# Patient Record
Sex: Female | Born: 1935 | Race: White | Hispanic: No | Smoking: Never smoker
Health system: Southern US, Community
[De-identification: ages and names within clinical notes are randomized; demographics above are authoritative.]

## PROBLEM LIST (undated history)

## (undated) DIAGNOSIS — G43909 Migraine, unspecified, not intractable, without status migrainosus: Secondary | ICD-10-CM

## (undated) DIAGNOSIS — H3530 Unspecified macular degeneration: Secondary | ICD-10-CM

## (undated) DIAGNOSIS — I809 Phlebitis and thrombophlebitis of unspecified site: Secondary | ICD-10-CM

## (undated) DIAGNOSIS — E785 Hyperlipidemia, unspecified: Secondary | ICD-10-CM

## (undated) DIAGNOSIS — E119 Type 2 diabetes mellitus without complications: Secondary | ICD-10-CM

## (undated) DIAGNOSIS — R059 Cough, unspecified: Secondary | ICD-10-CM

## (undated) DIAGNOSIS — M1990 Unspecified osteoarthritis, unspecified site: Secondary | ICD-10-CM

## (undated) DIAGNOSIS — M48061 Spinal stenosis, lumbar region without neurogenic claudication: Secondary | ICD-10-CM

## (undated) DIAGNOSIS — M069 Rheumatoid arthritis, unspecified: Secondary | ICD-10-CM

## (undated) DIAGNOSIS — R911 Solitary pulmonary nodule: Secondary | ICD-10-CM

## (undated) DIAGNOSIS — J189 Pneumonia, unspecified organism: Secondary | ICD-10-CM

## (undated) DIAGNOSIS — R05 Cough: Secondary | ICD-10-CM

## (undated) DIAGNOSIS — F419 Anxiety disorder, unspecified: Secondary | ICD-10-CM

## (undated) DIAGNOSIS — F32A Depression, unspecified: Secondary | ICD-10-CM

## (undated) DIAGNOSIS — Z923 Personal history of irradiation: Secondary | ICD-10-CM

## (undated) DIAGNOSIS — C50919 Malignant neoplasm of unspecified site of unspecified female breast: Secondary | ICD-10-CM

## (undated) DIAGNOSIS — I10 Essential (primary) hypertension: Secondary | ICD-10-CM

## (undated) DIAGNOSIS — I499 Cardiac arrhythmia, unspecified: Secondary | ICD-10-CM

## (undated) DIAGNOSIS — M109 Gout, unspecified: Secondary | ICD-10-CM

## (undated) DIAGNOSIS — F329 Major depressive disorder, single episode, unspecified: Secondary | ICD-10-CM

## (undated) DIAGNOSIS — J849 Interstitial pulmonary disease, unspecified: Secondary | ICD-10-CM

## (undated) HISTORY — DX: Rheumatoid arthritis, unspecified: M06.9

## (undated) HISTORY — DX: Solitary pulmonary nodule: R91.1

## (undated) HISTORY — PX: ABDOMINAL HYSTERECTOMY: SHX81

## (undated) HISTORY — PX: ESOPHAGOGASTRODUODENOSCOPY: SHX1529

## (undated) HISTORY — DX: Hyperlipidemia, unspecified: E78.5

## (undated) HISTORY — PX: COLONOSCOPY: SHX174

## (undated) HISTORY — DX: Unspecified macular degeneration: H35.30

## (undated) HISTORY — DX: Spinal stenosis, lumbar region without neurogenic claudication: M48.061

## (undated) HISTORY — DX: Gout, unspecified: M10.9

## (undated) HISTORY — DX: Phlebitis and thrombophlebitis of unspecified site: I80.9

## (undated) HISTORY — DX: Migraine, unspecified, not intractable, without status migrainosus: G43.909

## (undated) HISTORY — DX: Depression, unspecified: F32.A

## (undated) HISTORY — DX: Anxiety disorder, unspecified: F41.9

## (undated) HISTORY — PX: LYMPH NODE BIOPSY: SHX201

## (undated) HISTORY — DX: Essential (primary) hypertension: I10

## (undated) HISTORY — DX: Cough: R05

## (undated) HISTORY — PX: MASTECTOMY, PARTIAL: SHX709

## (undated) HISTORY — DX: Cough, unspecified: R05.9

## (undated) HISTORY — DX: Interstitial pulmonary disease, unspecified: J84.9

## (undated) HISTORY — DX: Unspecified osteoarthritis, unspecified site: M19.90

## (undated) HISTORY — DX: Major depressive disorder, single episode, unspecified: F32.9

---
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
## 2004-05-15 ENCOUNTER — Ambulatory Visit: Payer: Self-pay | Admitting: Otolaryngology

## 2004-07-11 ENCOUNTER — Ambulatory Visit: Payer: Self-pay | Admitting: Otolaryngology

## 2004-07-11 ENCOUNTER — Other Ambulatory Visit: Payer: Self-pay

## 2004-07-19 ENCOUNTER — Ambulatory Visit: Payer: Self-pay | Admitting: Otolaryngology

## 2004-10-12 ENCOUNTER — Ambulatory Visit: Payer: Self-pay | Admitting: Internal Medicine

## 2005-10-24 ENCOUNTER — Ambulatory Visit: Payer: Self-pay | Admitting: Internal Medicine

## 2006-10-28 ENCOUNTER — Ambulatory Visit: Payer: Self-pay | Admitting: Internal Medicine

## 2007-01-01 ENCOUNTER — Ambulatory Visit: Payer: Self-pay | Admitting: Gastroenterology

## 2008-01-13 ENCOUNTER — Ambulatory Visit: Payer: Self-pay | Admitting: Internal Medicine

## 2008-11-16 ENCOUNTER — Ambulatory Visit: Payer: Self-pay | Admitting: Rheumatology

## 2009-01-20 ENCOUNTER — Ambulatory Visit: Payer: Self-pay | Admitting: Internal Medicine

## 2010-01-24 ENCOUNTER — Ambulatory Visit: Payer: Self-pay | Admitting: Internal Medicine

## 2010-04-01 ENCOUNTER — Inpatient Hospital Stay: Payer: Self-pay | Admitting: Family Medicine

## 2011-02-26 ENCOUNTER — Ambulatory Visit: Payer: Self-pay | Admitting: Family Medicine

## 2011-07-25 ENCOUNTER — Emergency Department: Payer: Self-pay | Admitting: Emergency Medicine

## 2011-07-25 LAB — URINALYSIS, COMPLETE
Bilirubin,UR: NEGATIVE
Blood: NEGATIVE
Glucose,UR: NEGATIVE mg/dL (ref 0–75)
Ketone: NEGATIVE
Nitrite: NEGATIVE
Protein: NEGATIVE
Transitional Epi: 1
WBC UR: 3 /HPF (ref 0–5)

## 2011-09-20 ENCOUNTER — Emergency Department: Payer: Self-pay | Admitting: *Deleted

## 2011-09-20 LAB — COMPREHENSIVE METABOLIC PANEL
Anion Gap: 14 (ref 7–16)
BUN: 13 mg/dL (ref 7–18)
Calcium, Total: 9 mg/dL (ref 8.5–10.1)
Chloride: 101 mmol/L (ref 98–107)
Co2: 27 mmol/L (ref 21–32)
EGFR (African American): >60
EGFR (Non-African Amer.): >60
Glucose: 176 mg/dL — ABNORMAL HIGH (ref 65–99)
Osmolality: 288 (ref 275–301)
Potassium: 4 mmol/L (ref 3.5–5.1)
SGOT(AST): 33 U/L (ref 15–37)
SGPT (ALT): 26 U/L
Sodium: 142 mmol/L (ref 136–145)
Total Protein: 7.8 g/dL (ref 6.4–8.2)

## 2011-09-20 LAB — CBC
HCT: 37 % (ref 35.0–47.0)
HGB: 12.2 g/dL (ref 12.0–16.0)
MCH: 30.4 pg (ref 26.0–34.0)
RBC: 4.03 10*6/uL (ref 3.80–5.20)
RDW: 14.7 % — ABNORMAL HIGH (ref 11.5–14.5)
WBC: 5.8 10*3/uL (ref 3.6–11.0)

## 2011-09-20 LAB — TROPONIN I: Troponin-I: <0.02 ng/mL

## 2011-09-20 LAB — CK TOTAL AND CKMB (NOT AT ARMC): CK-MB: 1.5 ng/mL (ref 0.5–3.6)

## 2012-04-29 ENCOUNTER — Ambulatory Visit: Payer: Self-pay | Admitting: Family Medicine

## 2012-07-14 ENCOUNTER — Ambulatory Visit: Payer: Self-pay | Admitting: Gastroenterology

## 2012-09-09 LAB — COMPREHENSIVE METABOLIC PANEL
Albumin: 3.4 g/dL (ref 3.4–5.0)
Alkaline Phosphatase: 62 U/L (ref 50–136)
Anion Gap: 10 (ref 7–16)
BUN: 19 mg/dL — ABNORMAL HIGH (ref 7–18)
Bilirubin,Total: 0.4 mg/dL (ref 0.2–1.0)
Calcium, Total: 9 mg/dL (ref 8.5–10.1)
Chloride: 103 mmol/L (ref 98–107)
Co2: 22 mmol/L (ref 21–32)
Creatinine: 0.74 mg/dL (ref 0.60–1.30)
EGFR (African American): >60
Glucose: 164 mg/dL — ABNORMAL HIGH (ref 65–99)
SGOT(AST): 35 U/L (ref 15–37)
SGPT (ALT): 28 U/L (ref 12–78)
Total Protein: 7.4 g/dL (ref 6.4–8.2)

## 2012-09-09 LAB — URINALYSIS, COMPLETE
Bacteria: NONE SEEN
Bilirubin,UR: NEGATIVE
Blood: NEGATIVE
Glucose,UR: NEGATIVE mg/dL (ref 0–75)
Ketone: NEGATIVE
Leukocyte Esterase: NEGATIVE
Nitrite: NEGATIVE
Ph: 5 (ref 4.5–8.0)
Specific Gravity: 1.019 (ref 1.003–1.030)
Squamous Epithelial: 3
WBC UR: 2 /HPF (ref 0–5)

## 2012-09-09 LAB — CBC
HGB: 12.9 g/dL (ref 12.0–16.0)
MCHC: 33.3 g/dL (ref 32.0–36.0)
MCV: 92 fL (ref 80–100)
Platelet: 156 10*3/uL (ref 150–440)
RDW: 15.1 % — ABNORMAL HIGH (ref 11.5–14.5)

## 2012-09-10 ENCOUNTER — Observation Stay: Payer: Self-pay | Admitting: Family Medicine

## 2012-09-10 LAB — BASIC METABOLIC PANEL
Anion Gap: 4 — ABNORMAL LOW (ref 7–16)
Chloride: 104 mmol/L (ref 98–107)
EGFR (African American): >60
EGFR (Non-African Amer.): >60
Glucose: 120 mg/dL — ABNORMAL HIGH (ref 65–99)
Potassium: 3.5 mmol/L (ref 3.5–5.1)
Sodium: 138 mmol/L (ref 136–145)

## 2012-09-10 LAB — HEMOGLOBIN A1C: Hemoglobin A1C: 7.4 % — ABNORMAL HIGH (ref 4.2–6.3)

## 2012-09-10 LAB — CBC WITH DIFFERENTIAL/PLATELET
Eosinophil %: 3.2 %
HCT: 36.2 % (ref 35.0–47.0)
MCV: 92 fL (ref 80–100)
Monocyte #: 0.7 x10 3/mm (ref 0.2–0.9)
Monocyte %: 11.4 %
RBC: 3.95 10*6/uL (ref 3.80–5.20)
RDW: 15.2 % — ABNORMAL HIGH (ref 11.5–14.5)
WBC: 6.1 10*3/uL (ref 3.6–11.0)

## 2012-09-10 LAB — LIPID PANEL
HDL Cholesterol: 42 mg/dL (ref 40–60)
Ldl Cholesterol, Calc: 60 mg/dL (ref 0–100)
VLDL Cholesterol, Calc: 28 mg/dL (ref 5–40)

## 2012-09-13 LAB — COMPREHENSIVE METABOLIC PANEL
Albumin: 3.5 g/dL (ref 3.4–5.0)
Alkaline Phosphatase: 70 U/L (ref 50–136)
Anion Gap: 8 (ref 7–16)
Bilirubin,Total: 0.5 mg/dL (ref 0.2–1.0)
Calcium, Total: 8.7 mg/dL (ref 8.5–10.1)
EGFR (Non-African Amer.): >60
Glucose: 118 mg/dL — ABNORMAL HIGH (ref 65–99)
Osmolality: 272 (ref 275–301)
SGOT(AST): 49 U/L — ABNORMAL HIGH (ref 15–37)
SGPT (ALT): 41 U/L (ref 12–78)
Sodium: 135 mmol/L — ABNORMAL LOW (ref 136–145)

## 2012-09-13 LAB — CBC
HCT: 38 % (ref 35.0–47.0)
HGB: 11.9 g/dL — ABNORMAL LOW (ref 12.0–16.0)
MCH: 28.6 pg (ref 26.0–34.0)
MCV: 91 fL (ref 80–100)
Platelet: 160 10*3/uL (ref 150–440)

## 2012-09-13 LAB — TROPONIN I: Troponin-I: <0.02 ng/mL

## 2012-09-13 LAB — CK TOTAL AND CKMB (NOT AT ARMC): CK-MB: 1.3 ng/mL (ref 0.5–3.6)

## 2012-09-14 ENCOUNTER — Observation Stay: Payer: Self-pay | Admitting: Family Medicine

## 2012-09-14 LAB — BASIC METABOLIC PANEL
BUN: 12 mg/dL (ref 7–18)
Calcium, Total: 8.7 mg/dL (ref 8.5–10.1)
Chloride: 105 mmol/L (ref 98–107)
Co2: 27 mmol/L (ref 21–32)
Creatinine: 0.74 mg/dL (ref 0.60–1.30)
EGFR (African American): >60
Glucose: 107 mg/dL — ABNORMAL HIGH (ref 65–99)
Osmolality: 276 (ref 275–301)
Potassium: 3.5 mmol/L (ref 3.5–5.1)
Sodium: 138 mmol/L (ref 136–145)

## 2012-09-14 LAB — CBC WITH DIFFERENTIAL/PLATELET
Basophil #: 0 10*3/uL (ref 0.0–0.1)
Basophil %: 1.3 %
Eosinophil %: 4.5 %
HCT: 35.7 % (ref 35.0–47.0)
HGB: 11.7 g/dL — ABNORMAL LOW (ref 12.0–16.0)
Lymphocyte #: 1.7 10*3/uL (ref 1.0–3.6)
Lymphocyte %: 43.5 %
MCH: 30.1 pg (ref 26.0–34.0)
MCV: 92 fL (ref 80–100)
Monocyte #: 0.4 x10 3/mm (ref 0.2–0.9)
Monocyte %: 11.6 %
Neutrophil %: 39.1 %
Platelet: 150 10*3/uL (ref 150–440)
RBC: 3.89 10*6/uL (ref 3.80–5.20)
RDW: 14.8 % — ABNORMAL HIGH (ref 11.5–14.5)
WBC: 3.8 10*3/uL (ref 3.6–11.0)

## 2012-09-14 LAB — LIPID PANEL
Cholesterol: 114 mg/dL (ref 0–200)
HDL Cholesterol: 34 mg/dL — ABNORMAL LOW (ref 40–60)
Ldl Cholesterol, Calc: 53 mg/dL (ref 0–100)
Triglycerides: 134 mg/dL (ref 0–200)
VLDL Cholesterol, Calc: 27 mg/dL (ref 5–40)

## 2012-09-14 LAB — TSH: Thyroid Stimulating Horm: 0.298 u[IU]/mL — ABNORMAL LOW

## 2012-09-16 LAB — BASIC METABOLIC PANEL
BUN: 16 mg/dL (ref 7–18)
Chloride: 103 mmol/L (ref 98–107)
Co2: 32 mmol/L (ref 21–32)
Creatinine: 0.72 mg/dL (ref 0.60–1.30)
EGFR (African American): >60
Osmolality: 279 (ref 275–301)
Potassium: 3.8 mmol/L (ref 3.5–5.1)
Sodium: 139 mmol/L (ref 136–145)

## 2013-05-08 LAB — CBC
HCT: 39.7 % (ref 35.0–47.0)
HGB: 13.7 g/dL (ref 12.0–16.0)
MCH: 31.3 pg (ref 26.0–34.0)
MCV: 91 fL (ref 80–100)
RBC: 4.39 10*6/uL (ref 3.80–5.20)
RDW: 15.7 % — ABNORMAL HIGH (ref 11.5–14.5)
WBC: 11.5 10*3/uL — ABNORMAL HIGH (ref 3.6–11.0)

## 2013-05-09 ENCOUNTER — Inpatient Hospital Stay: Payer: Self-pay

## 2013-05-09 LAB — COMPREHENSIVE METABOLIC PANEL
Albumin: 3.7 g/dL (ref 3.4–5.0)
Calcium, Total: 9.3 mg/dL (ref 8.5–10.1)
Chloride: 100 mmol/L (ref 98–107)
Co2: 26 mmol/L (ref 21–32)
Creatinine: 0.8 mg/dL (ref 0.60–1.30)
Glucose: 195 mg/dL — ABNORMAL HIGH (ref 65–99)
Osmolality: 276 (ref 275–301)
Potassium: 3.8 mmol/L (ref 3.5–5.1)
SGOT(AST): 37 U/L (ref 15–37)
Sodium: 134 mmol/L — ABNORMAL LOW (ref 136–145)

## 2013-05-09 LAB — URINALYSIS, COMPLETE
Bilirubin,UR: NEGATIVE
Ph: 6 (ref 4.5–8.0)
Protein: NEGATIVE
RBC,UR: 6 /HPF (ref 0–5)
Specific Gravity: 1.018 (ref 1.003–1.030)
Squamous Epithelial: 1
WBC UR: 1 /HPF (ref 0–5)

## 2013-05-09 LAB — TROPONIN I: Troponin-I: <0.02 ng/mL

## 2013-05-10 LAB — BASIC METABOLIC PANEL
Anion Gap: 4 — ABNORMAL LOW (ref 7–16)
Calcium, Total: 7.9 mg/dL — ABNORMAL LOW (ref 8.5–10.1)
Chloride: 106 mmol/L (ref 98–107)
EGFR (African American): >60
Glucose: 196 mg/dL — ABNORMAL HIGH (ref 65–99)
Osmolality: 283 (ref 275–301)
Potassium: 3.6 mmol/L (ref 3.5–5.1)
Sodium: 138 mmol/L (ref 136–145)

## 2013-05-10 LAB — CBC WITH DIFFERENTIAL/PLATELET
Basophil #: 0 10*3/uL (ref 0.0–0.1)
Basophil %: 0.1 %
Eosinophil #: 0 10*3/uL (ref 0.0–0.7)
Eosinophil %: 0 %
HCT: 33.5 % — ABNORMAL LOW (ref 35.0–47.0)
HGB: 11.4 g/dL — ABNORMAL LOW (ref 12.0–16.0)
MCV: 92 fL (ref 80–100)
Monocyte %: 1.7 %
Neutrophil #: 10.6 10*3/uL — ABNORMAL HIGH (ref 1.4–6.5)
Platelet: 120 10*3/uL — ABNORMAL LOW (ref 150–440)
RBC: 3.65 10*6/uL — ABNORMAL LOW (ref 3.80–5.20)
WBC: 11.3 10*3/uL — ABNORMAL HIGH (ref 3.6–11.0)

## 2013-05-14 LAB — CULTURE, BLOOD (SINGLE)

## 2013-07-02 DIAGNOSIS — C50919 Malignant neoplasm of unspecified site of unspecified female breast: Secondary | ICD-10-CM

## 2013-07-02 DIAGNOSIS — Z923 Personal history of irradiation: Secondary | ICD-10-CM

## 2013-07-02 HISTORY — DX: Personal history of irradiation: Z92.3

## 2013-07-02 HISTORY — PX: BREAST BIOPSY: SHX20

## 2013-07-02 HISTORY — DX: Malignant neoplasm of unspecified site of unspecified female breast: C50.919

## 2013-07-10 ENCOUNTER — Ambulatory Visit: Payer: Self-pay | Admitting: Rheumatology

## 2013-10-15 DIAGNOSIS — F329 Major depressive disorder, single episode, unspecified: Secondary | ICD-10-CM | POA: Insufficient documentation

## 2013-10-15 DIAGNOSIS — M109 Gout, unspecified: Secondary | ICD-10-CM | POA: Insufficient documentation

## 2013-10-15 DIAGNOSIS — I10 Essential (primary) hypertension: Secondary | ICD-10-CM | POA: Insufficient documentation

## 2013-10-15 DIAGNOSIS — M1990 Unspecified osteoarthritis, unspecified site: Secondary | ICD-10-CM | POA: Insufficient documentation

## 2013-10-15 DIAGNOSIS — F32A Depression, unspecified: Secondary | ICD-10-CM | POA: Insufficient documentation

## 2013-10-15 DIAGNOSIS — M069 Rheumatoid arthritis, unspecified: Secondary | ICD-10-CM | POA: Insufficient documentation

## 2013-10-15 DIAGNOSIS — H3530 Unspecified macular degeneration: Secondary | ICD-10-CM | POA: Insufficient documentation

## 2013-10-15 DIAGNOSIS — E782 Mixed hyperlipidemia: Secondary | ICD-10-CM | POA: Insufficient documentation

## 2013-10-15 DIAGNOSIS — F419 Anxiety disorder, unspecified: Secondary | ICD-10-CM

## 2014-02-03 ENCOUNTER — Ambulatory Visit: Payer: Self-pay | Admitting: Family Medicine

## 2014-02-15 ENCOUNTER — Ambulatory Visit: Payer: Self-pay | Admitting: Family Medicine

## 2014-02-18 ENCOUNTER — Ambulatory Visit: Payer: Self-pay | Admitting: Internal Medicine

## 2014-02-22 LAB — PATHOLOGY REPORT

## 2014-02-27 ENCOUNTER — Observation Stay: Payer: Self-pay

## 2014-02-27 LAB — URINALYSIS, COMPLETE
BILIRUBIN, UR: NEGATIVE
Blood: NEGATIVE
GLUCOSE, UR: NEGATIVE mg/dL (ref 0–75)
KETONE: NEGATIVE
Nitrite: NEGATIVE
Ph: 6 (ref 4.5–8.0)
Protein: NEGATIVE
RBC,UR: 2 /HPF (ref 0–5)
SPECIFIC GRAVITY: 1.015 (ref 1.003–1.030)
Squamous Epithelial: 3

## 2014-02-27 LAB — PROTIME-INR
INR: 1
Prothrombin Time: 13.5 secs (ref 11.5–14.7)

## 2014-02-27 LAB — CBC
HCT: 38.7 % (ref 35.0–47.0)
HGB: 12.8 g/dL (ref 12.0–16.0)
MCH: 31.1 pg (ref 26.0–34.0)
MCHC: 32.9 g/dL (ref 32.0–36.0)
MCV: 94 fL (ref 80–100)
PLATELETS: 175 10*3/uL (ref 150–440)
RBC: 4.1 10*6/uL (ref 3.80–5.20)
RDW: 14.9 % — ABNORMAL HIGH (ref 11.5–14.5)
WBC: 7.2 10*3/uL (ref 3.6–11.0)

## 2014-02-27 LAB — COMPREHENSIVE METABOLIC PANEL
ALT: 33 U/L
ANION GAP: 13 (ref 7–16)
Albumin: 3.6 g/dL (ref 3.4–5.0)
Alkaline Phosphatase: 56 U/L
BUN: 14 mg/dL (ref 7–18)
Bilirubin,Total: 0.4 mg/dL (ref 0.2–1.0)
CHLORIDE: 101 mmol/L (ref 98–107)
Calcium, Total: 9.1 mg/dL (ref 8.5–10.1)
Co2: 23 mmol/L (ref 21–32)
Creatinine: 0.87 mg/dL (ref 0.60–1.30)
EGFR (African American): >60
EGFR (Non-African Amer.): >60
Glucose: 135 mg/dL — ABNORMAL HIGH (ref 65–99)
Osmolality: 276 (ref 275–301)
Potassium: 3.7 mmol/L (ref 3.5–5.1)
SGOT(AST): 33 U/L (ref 15–37)
Sodium: 137 mmol/L (ref 136–145)
Total Protein: 7.2 g/dL (ref 6.4–8.2)

## 2014-02-27 LAB — APTT: ACTIVATED PTT: 28.6 s (ref 23.6–35.9)

## 2014-02-27 LAB — TROPONIN I

## 2014-02-28 LAB — LIPID PANEL
Cholesterol: 146 mg/dL (ref 0–200)
HDL: 43 mg/dL (ref 40–60)
LDL CHOLESTEROL, CALC: 47 mg/dL (ref 0–100)
Triglycerides: 280 mg/dL — ABNORMAL HIGH (ref 0–200)
VLDL CHOLESTEROL, CALC: 56 mg/dL — AB (ref 5–40)

## 2014-03-02 ENCOUNTER — Ambulatory Visit: Payer: Self-pay | Admitting: Internal Medicine

## 2014-03-18 ENCOUNTER — Ambulatory Visit: Payer: Self-pay | Admitting: Surgery

## 2014-03-18 LAB — CBC WITH DIFFERENTIAL/PLATELET
Basophil #: 0.1 10*3/uL (ref 0.0–0.1)
Basophil %: 1.1 %
Eosinophil #: 0.2 10*3/uL (ref 0.0–0.7)
Eosinophil %: 3.6 %
HCT: 38.3 % (ref 35.0–47.0)
HGB: 12.9 g/dL (ref 12.0–16.0)
Lymphocyte #: 1.5 10*3/uL (ref 1.0–3.6)
Lymphocyte %: 27.5 %
MCH: 31.7 pg (ref 26.0–34.0)
MCHC: 33.6 g/dL (ref 32.0–36.0)
MCV: 94 fL (ref 80–100)
MONO ABS: 0.7 x10 3/mm (ref 0.2–0.9)
Monocyte %: 13.4 %
NEUTROS ABS: 2.9 10*3/uL (ref 1.4–6.5)
Neutrophil %: 54.4 %
Platelet: 174 10*3/uL (ref 150–440)
RBC: 4.06 10*6/uL (ref 3.80–5.20)
RDW: 14.8 % — ABNORMAL HIGH (ref 11.5–14.5)
WBC: 5.4 10*3/uL (ref 3.6–11.0)

## 2014-03-26 ENCOUNTER — Ambulatory Visit: Payer: Self-pay | Admitting: Surgery

## 2014-03-31 LAB — PATHOLOGY REPORT

## 2014-04-12 ENCOUNTER — Ambulatory Visit: Payer: Self-pay | Admitting: Internal Medicine

## 2014-04-28 LAB — CBC CANCER CENTER
BASOS ABS: 0.1 x10 3/mm (ref 0.0–0.1)
Basophil %: 1.3 %
Eosinophil #: 0.2 x10 3/mm (ref 0.0–0.7)
Eosinophil %: 5.8 %
HCT: 39 % (ref 35.0–47.0)
HGB: 12.6 g/dL (ref 12.0–16.0)
LYMPHS PCT: 24.8 %
Lymphocyte #: 1 x10 3/mm (ref 1.0–3.6)
MCH: 30.3 pg (ref 26.0–34.0)
MCHC: 32.3 g/dL (ref 32.0–36.0)
MCV: 94 fL (ref 80–100)
MONO ABS: 0.7 x10 3/mm (ref 0.2–0.9)
MONOS PCT: 16.8 %
NEUTROS ABS: 2.1 x10 3/mm (ref 1.4–6.5)
Neutrophil %: 51.3 %
Platelet: 155 x10 3/mm (ref 150–440)
RBC: 4.15 10*6/uL (ref 3.80–5.20)
RDW: 14.7 % — ABNORMAL HIGH (ref 11.5–14.5)
WBC: 4.1 x10 3/mm (ref 3.6–11.0)

## 2014-05-02 ENCOUNTER — Ambulatory Visit: Payer: Self-pay | Admitting: Internal Medicine

## 2014-05-05 LAB — CBC CANCER CENTER
Basophil #: 0.1 x10 3/mm (ref 0.0–0.1)
Basophil %: 1.2 %
EOS ABS: 0.3 x10 3/mm (ref 0.0–0.7)
EOS PCT: 5.7 %
HCT: 39.4 % (ref 35.0–47.0)
HGB: 12.7 g/dL (ref 12.0–16.0)
LYMPHS PCT: 32.3 %
Lymphocyte #: 1.6 x10 3/mm (ref 1.0–3.6)
MCH: 30.2 pg (ref 26.0–34.0)
MCHC: 32.2 g/dL (ref 32.0–36.0)
MCV: 94 fL (ref 80–100)
Monocyte #: 0.5 x10 3/mm (ref 0.2–0.9)
Monocyte %: 11 %
NEUTROS ABS: 2.5 x10 3/mm (ref 1.4–6.5)
NEUTROS PCT: 49.8 %
PLATELETS: 159 x10 3/mm (ref 150–440)
RBC: 4.2 10*6/uL (ref 3.80–5.20)
RDW: 14.2 % (ref 11.5–14.5)
WBC: 5 x10 3/mm (ref 3.6–11.0)

## 2014-05-12 LAB — CBC CANCER CENTER
Basophil #: 0.1 x10 3/mm (ref 0.0–0.1)
Basophil %: 0.6 %
Eosinophil #: 0.2 x10 3/mm (ref 0.0–0.7)
Eosinophil %: 2.5 %
HCT: 39.1 % (ref 35.0–47.0)
HGB: 12.9 g/dL (ref 12.0–16.0)
LYMPHS ABS: 1.3 x10 3/mm (ref 1.0–3.6)
LYMPHS PCT: 12.9 %
MCH: 30.7 pg (ref 26.0–34.0)
MCHC: 33 g/dL (ref 32.0–36.0)
MCV: 93 fL (ref 80–100)
MONO ABS: 1.2 x10 3/mm — AB (ref 0.2–0.9)
MONOS PCT: 12.1 %
NEUTROS ABS: 7 x10 3/mm — AB (ref 1.4–6.5)
NEUTROS PCT: 71.9 %
PLATELETS: 160 x10 3/mm (ref 150–440)
RBC: 4.2 10*6/uL (ref 3.80–5.20)
RDW: 14.6 % — ABNORMAL HIGH (ref 11.5–14.5)
WBC: 9.8 x10 3/mm (ref 3.6–11.0)

## 2014-05-19 LAB — CBC CANCER CENTER
BASOS ABS: 0.1 x10 3/mm (ref 0.0–0.1)
BASOS PCT: 1 %
Eosinophil #: 0.2 x10 3/mm (ref 0.0–0.7)
Eosinophil %: 4.2 %
HCT: 37.2 % (ref 35.0–47.0)
HGB: 12.1 g/dL (ref 12.0–16.0)
Lymphocyte #: 1 x10 3/mm (ref 1.0–3.6)
Lymphocyte %: 19.5 %
MCH: 30.2 pg (ref 26.0–34.0)
MCHC: 32.5 g/dL (ref 32.0–36.0)
MCV: 93 fL (ref 80–100)
MONO ABS: 0.7 x10 3/mm (ref 0.2–0.9)
MONOS PCT: 13.5 %
Neutrophil #: 3.2 x10 3/mm (ref 1.4–6.5)
Neutrophil %: 61.8 %
PLATELETS: 223 x10 3/mm (ref 150–440)
RBC: 3.99 10*6/uL (ref 3.80–5.20)
RDW: 14.7 % — ABNORMAL HIGH (ref 11.5–14.5)
WBC: 5.1 x10 3/mm (ref 3.6–11.0)

## 2014-06-01 ENCOUNTER — Ambulatory Visit: Payer: Self-pay | Admitting: Internal Medicine

## 2014-06-02 LAB — CBC CANCER CENTER
BASOS PCT: 1.3 %
Basophil #: 0.1 x10 3/mm (ref 0.0–0.1)
Eosinophil #: 0.2 x10 3/mm (ref 0.0–0.7)
Eosinophil %: 5.1 %
HCT: 37.2 % (ref 35.0–47.0)
HGB: 11.9 g/dL — ABNORMAL LOW (ref 12.0–16.0)
Lymphocyte #: 0.9 x10 3/mm — ABNORMAL LOW (ref 1.0–3.6)
Lymphocyte %: 22.4 %
MCH: 29.9 pg (ref 26.0–34.0)
MCHC: 32.1 g/dL (ref 32.0–36.0)
MCV: 93 fL (ref 80–100)
MONO ABS: 0.8 x10 3/mm (ref 0.2–0.9)
Monocyte %: 18.6 %
NEUTROS ABS: 2.2 x10 3/mm (ref 1.4–6.5)
NEUTROS PCT: 52.6 %
Platelet: 176 x10 3/mm (ref 150–440)
RBC: 3.99 10*6/uL (ref 3.80–5.20)
RDW: 15.4 % — ABNORMAL HIGH (ref 11.5–14.5)
WBC: 4.2 x10 3/mm (ref 3.6–11.0)

## 2014-07-02 ENCOUNTER — Ambulatory Visit: Payer: Self-pay | Admitting: Internal Medicine

## 2014-07-12 ENCOUNTER — Ambulatory Visit: Admit: 2014-07-12 | Disposition: A | Discharge status: Home / Self Care | Payer: Self-pay | Admitting: Specialist

## 2014-08-04 ENCOUNTER — Ambulatory Visit: Payer: Self-pay | Admitting: Internal Medicine

## 2014-10-22 NOTE — H&P (Signed)
PATIENT NAME:  [name], [name] MR#:  [id number] DATE OF BIRTH:  1932-01-27  DATE OF ADMISSION:  09/09/2012  REFERRING PHYSICIAN:  Priscella Mann, MD  PRIMARY CARE PHYSICIAN:  Dion Body, MD, at [facility]:  Aphasia.   HISTORY OF PRESENT ILLNESS:  The patient is a pleasant 81 year old Caucasian female with a history of rheumatoid arthritis, hypertension, diabetes and questionable history of irregular heart rhythm that could be paroxysmal Afib, but unclear, who presents with acute onset of slurred speech and aphasia earlier today. The patient stated that she was sitting on the sofa talking and holding her grandchild. She suddenly felt like she could not speak and per her daughter who was in the room, the granddaughter noted her to have slurred speech and uncontrolled tongue movement. The patient felt no numbness or weakness at that time and EMS was called at that time. Soon the symptoms resolved possibly lasting about 15 to 20 minutes and currently she is back to her baseline. CAT scan is negative for a stroke. Hospitalist services were contacted for further evaluation and management.   PAST MEDICAL HISTORY:  Questionable history of Afib versus other irregular heart rhythm, diabetes, hypertension, rheumatoid arthritis, gout, arthritis, phlebitis, history of hemorrhoids, history of chronic constipation, macular degeneration.   PAST SURGICAL HISTORY:  Hysterectomy and breast biopsy.   ALLERGIES:  PENICILLIN.   FAMILY HISTORY:  Mom with colon cancer and dad also with cancer and emphysema. Hypertension and CAD also runs in the family.   SOCIAL HISTORY:  She lives with her daughter. No smoking tobacco, alcohol or drug use.   OUTPATIENT MEDICATIONS:  Acetaminophen 500 mg 2 tabs every 6 hours as needed for pain, allopurinol 300 mg daily, baby aspirin daily, Centrum Silver 1 tab daily, Cipro 500 mg 3 times a day started on September 06, 1306 for a UTI, folic acid 1 mg 2 tabs once  a day, lisinopril 20 mg daily, lovastatin 40 mg at bedtime, metformin 500 mg daily, methotrexate 10 mg on Wednesdays, metoprolol 100 mg 3 times a day, paroxetine 20 mg at bedtime, prednisone 5 mg with meals once a day.   REVIEW OF SYSTEMS:  CONSTITUTIONAL: No fever or fatigue. No weight changes.  EYES: Has chronic decreased vision.  ENT: Some sore throat. Some rhinorrhea.  RESPIRATORY: Cough for the past week that is better. No wheezing or shortness of breath. No COPD.  CARDIOVASCULAR: Possible irregular heart rate versus paroxysmal Afib possibly. No chest pains. Positive for off and on palpitations, less so now, and had more recurrent episodes prior. Has a history of high blood pressure.  GASTROINTESTINAL: No nausea, vomiting, diarrhea, abdominal pain or black tarry stools. No bloody stools.  GENITOURINARY: Recent UTI symptoms. Currently on Cipro and resolved.  HEMATOLOGIC/LYMPHATICS: No anemia or easy bruising.  SKIN: No rashes.  MUSCULOSKELETAL: Chronic arthritis and rheumatoid arthritis.  NEUROLOGIC: Denies focal weakness or numbness. No history of TIA or strokes.  PSYCHIATRIC: Has some depression.   PHYSICAL EXAMINATION:  VITAL SIGNS: Temperature 98.2, pulse rate initially 66, respiratory rate 20, blood pressure 165/67, oxygen saturation 95% on room air.  GENERAL: The patient is an elderly Caucasian female lying in bed in no obvious distress.  HEENT: Normocephalic, atraumatic. Pupils are equal and reactive. Anicteric sclerae. Extraocular muscles intact. Moist mucous membranes.  NECK: Supple. No thyroid tenderness or cervical lymphadenopathy.  CARDIOVASCULAR: S1, S2 regular rate and rhythm. No murmurs, rubs, or gallops.  LUNGS: Clear to auscultation without wheezing or rhonchi.  ABDOMEN:  Soft, nontender, nondistended. No organomegaly appreciated.  EXTREMITIES: No significant lower extremity edema.  NEUROLOGICAL: Cranial nerves appear to be grossly intact, II to X. Strength is 5 out of 5  in all extremities. Sensation is intact to light touch.  PSYCHIATRIC: Pleasant, cooperative. Awake, alert, oriented x 3.   DIAGNOSTIC DATA:  CAT scan of the head is negative for acute stroke. Glucose is 164, BUN 19, creatinine 0.74, sodium 135, potassium 3.7 and chloride 103. LFTs are within normal limits. WBC is 6.9, hemoglobin 12.9, platelets 156. Urinalysis is not suggestive of an infection. EKG: Normal sinus rhythm, rate is 62 and complete right bundle. Compared to previous EKG, no acute changes. There are no acute ST elevations or depressions. Chest x-ray, 1 view, showing no acute cardiopulmonary disease.   ASSESSMENT AND PLAN:  A 81 year old Caucasian female with history of diabetes, hypertension, rheumatoid arthritis and questionable history of irregular heart rhythm possibly atrial fibrillation currently in sinus who presents with acute onset of aphasia and slurred speech lasting about 15 to 20 minutes now resolved and possibly a transient ischemic attack. There is a CT of the head which is negative for stroke and the patient is completely back to her baseline. We will obtain an MRI to evaluate for the possibility of being a stroke, get an echocardiogram as well as an ultrasound of the carotids to look for blockages. We will place her on remote telemetry to look for any atrial fibrillation. If there is atrial fibrillation, she likely needs to be on anticoagulation given the transient ischemic attack, diabetes, hypertension and age. At this point, I will hold the aspirin and start her on Aggrenox as well as resume the statin, check frequent neuro checks and check lipid profile. I will hold the metformin, check a hemoglobin A1c and start sliding scale insulin. I will continue the beta-blocker and blood pressure management. She had a recent urinary tract infection. I will continue the Cipro, but at a lower dose for 4 more days. We will start heparin for deep vein thrombosis prophylaxis and resume the  methotrexate and prednisone for the rheumatoid arthritis which appears to be chronic and stable.   The patient is DO NOT RESUSCITATE per her wishes.   TOTAL TIME SPENT:  55 minutes.    ____________________________ Vivien Presto, MD sa:si D: 09/09/2012 22:39:00 ET T: 09/09/2012 23:06:03 ET JOB#: [id number]  cc: Vivien Presto, MD, <Dictator> Dion Body, MD   Vivien Presto MD ELECTRONICALLY SIGNED 09/23/2012 13:21

## 2014-10-22 NOTE — Discharge Summary (Signed)
PATIENT NAME:  [name], [name] MR#:  [id number] DATE OF BIRTH:  [birth year]  DATE OF ADMISSION:  05/09/2013  DATE OF DISCHARGE:  05/10/2013  PRIMARY CARE PHYSICIAN:  Dr. Dion Body  DISCHARGE DIAGNOSES: 1.  Pneumonia.  2.  Adrenal crisis.  3.  Hypoxia.  4.  Rheumatoid arthritis, on chronic steroids.   HISTORY OF PRESENT ILLNESS:  Please see initial history and physical for details. Briefly, this is a 81 year old, on chronic methotrexate and prednisone for rheumatoid arthritis. She was admitted after sudden onset of fever, generalized malaise, nausea, vomiting, cough. She was also lethargic. In the Emergency Room, she was found to have a fever of 103. She was also found to have a slightly elevated white count of 11.5. Chest x-ray showed interstitial infiltrates.   HOSPITAL COURSE BY ISSUE:  1. Pneumonia. She was admitted, treated with IV Zosyn and levofloxacin. She improved clinically, and was able to come off of oxygen. Her white blood count normalized. Blood cultures x 2 were negative. Urinalysis was negative. No sputum cultures were submitted. The patient will be discharged on a course of levofloxacin for 4 more days.   2.  Likely adrenal insufficiency. She became ill and developed fever, nausea, vomiting. Likely related to adrenal insufficiency, given her chronic prednisone use. She was treated with dexamethasone, and had clinical improvement. She will be discharged on a prednisone taper over 6 days, back to her 5 mg a day dose.   3.  Hypoxia. This improved with gentle diuresis.   DISCHARGE MEDICATIONS: Please see [facility] physician discharge instructions. New medications will only be levofloxacin and a prednisone taper.   DISCHARGE FOLLOW UP: The patient will follow up with Dr. Netty Starring in 1 to 2 weeks.   INSTRUCTIONS: Discharge diet: ADA low-sodium diet, regular consistency. Activity as tolerated.   This discharge took 35 minutes.     ____________________________ [name].  Mackenzie Spurr, MD dpf:mr D: 05/10/2013 13:01:48 ET T: 05/10/2013 19:13:34 ET JOB#: [id number]  cc: [name]. Mackenzie Spurr, MD, <Dictator> Euriah Matlack Mackenzie Spurr MD ELECTRONICALLY SIGNED 05/13/2013 21:05

## 2014-10-22 NOTE — H&P (Signed)
PATIENT NAME:  [name], STOUGH MR#:  [id number] DATE OF BIRTH:  1932/06/07  DATE OF ADMISSION:  09/14/2012  DATE OF BIRTH: [birth year].   FAMILY CARE PHYSICIAN:  Dr. Netty Starring from [facility]. [facility]   REFERRING M.D:  Dr. Ulice Brilliant   CHIEF COMPLAINT:  Headache, expressive aphasia and blurry vision.  HISTORY OF PRESENT ILLNESS: The patient is a 81 year old Caucasian female with a past medical history of rheumatoid arthritis, hypertension, diabetes mellitus and a questionable history of irregular heart rhythm which could be paroxysmal A. fib, which is unclear, and a recent history of TIA, regarding which she was admitted to the hospital on 09/09/2012 and was discharged on 09/18/2012. Is presenting back to the ER with a similar complaint of headache,  blurry vision, and difficulty with her speech beginning at around 6:30 p.m. The patient reported that everything started with headache and blurry vision. Subsequently, she has noticed stuttering and she was unable to express herself at around 6:30 p.m., which has lasted for 5 to 10 minutes. Following, the symptoms spontaneously resolved. The patient was really concerned as she was just admitted to the hospital on March 11th for a similar complaint of transient ischemic attack and was discharged home. Interestingly, the patient is reporting that during that admission she never had an MRI of the brain done for some reason. She does not know why it was not done, but she is worried as she is coming back with similar symptoms. During my examination, patient's symptoms are completely resolved and denies any pain. Denies any chest pain or shortness of breath. Denies any abdominal pain, nausea, vomiting, diarrhea. Her daughter is at the bedside. The patient is on ciprofloxacin for a nonproductive cough, which was given at the time of discharge during the previous admission.   The patient also reports that she has a chronic, unstable gait. It is not worse today.   PAST  MEDICAL HISTORY: Rheumatoid arthritis, questionable history of paroxysmal atrial fibrillation, diabetes mellitus type 2, hypertension, rheumatoid arthritis, gout, arthritis, phlebitis, history of hemorrhoids, chronic constipation, macular degeneration.   PAST SURGICAL HISTORY: Hysterectomy and breast biopsy.   ALLERGIES: The patient is ALLERGIC TO PENICILLIN.   PSYCHOSOCIAL HISTORY: Living with daughter. Denies any history of smoking, alcohol, or illicit drug usage.   FAMILY HISTORY: Mom had history of colon cancer and dad had history of emphysema and cancer, hypertension.   REVIEW OF SYSTEMS: CONSTITUTIONAL: Denies any fever, fatigue, weakness, pain.  EYES: Blurry vision was present at around 6:30 p.m., which is gone now.  Denies any glaucoma or eye pain.  ENT: Denies any epistaxis, discharge, snoring, postnasal drip.  RESPIRATION: Dry cough. Denies any hemoptysis, dyspnea, asthma.  CARDIOVASCULAR: No chest pain, palpitations, syncope.  GASTROINTESTINAL: No nausea, vomiting, diarrhea, GERD.  GENITOURINARY: No dysuria, hematuria.  GYN AND BREASTS: No breast mass or vaginal discharge.  ENDOCRINE: No polyuria, nocturia, thyroid problems.  HEMATOLOGIC/LYMPHATIC: Denies any anemia or bleeding.  INTEGUMENT: No acne, rash, lesions.  MUSCULOSKELETAL: Denies any neck pain, back pain, redness. The patient has a history of rheumatoid arthritis and gout. NEUROLOGIC: Previous history of TIA on March 11th. Denies any vertigo, ataxia, dementia.  PSYCHIATRIC: Oriented. No insomnia, ADD, OCD.   PHYSICAL EXAMINATION: VITAL SIGNS: Temperature 98.7, pulse 64, respirations 24, blood pressure 161/64, satting 95% to 98% on room air.  GENERAL APPEARANCE: Not in any acute distress Answering questions appropriately. Moderately-built and moderately-nourished.  HEENT: Normocephalic, atraumatic. Pupils are equally reactive to light and accommodation. No conjunctival injection. No scleral  icterus. No postnasal drip.  No sinus tenderness. Moist mucous membranes.  NECK: Supple no JVD, no thyromegaly, no lymphadenopathy.  LUNGS: Clear to auscultation bilaterally. No accessory muscle usage. No anterior chest wall tenderness on palpation.  CARDIAC: S1, S2 normal. Regular rate and rhythm. No murmurs. No gallops.   GASTROINTESTINAL: Soft. Bowel sounds are positive in all four quadrants. Nontender, nondistended. No masses felt. No hepatosplenomegaly.  NEUROLOGIC: Awake, alert, oriented x3. Cranial nerves II-XII are grossly intact. Motor and sensory are grossly intact. Reflexes are 2+.  BACK: No CVA tenderness.  SKIN: No rashes, lesions, acne. Warm to touch. Normal turgor.  EXTREMITIES: No edema. No cyanosis. No clubbing. Peripheral pulses are 2+.  MUSCULOSKELETAL: No joint effusion, tenderness, or erythema. Range of motion is grossly intact.   IMAGING STUDIES: A CT scan of the head is with no acute findings.   Echo Doppler was done on March 12th which has revealed an ejection fraction  65% to 70%, normal left ventricular systolic function, mild tricuspid valve regurgitation, mild right mitral valve regurgitation. Glucose is 118, BUN 15, creatinine 0.79, sodium 135, potassium 4.0, chloride 102, CO2 27, GFR greater than 60, serum osmolality 272, calcium 8.7, WBC 4.3, hemoglobin 11.9, hematocrit 38.0, platelet count is 160,000.   ASSESSMENT AND PLAN: A 81 year old Caucasian female presenting to the ER with  symptoms of headache, blurry vision, stuttering. Will be admitted with the following assessment and plan:   1.  Transient ischemic attack: Recurrent, questionable etiology. It is unclear at this time, but the main concern is cardiovascular etiology/erythema. I will place her on aspirin 325 mg and resume her home medication, statin 40 mg once daily.  2.  Plan neurologic checks q.4 hours for the next 24 hours.  3. We will obtain an MRI of the brain which was not done during the previous admission. Carotid Dopplers and  2-D echocardiogram were done during the previous admission on March 11th. The patient needs tight control of risk factors including diabetes mellitus, hypertension, and hyperlipidemia.  4.  Acute bronchitis: We will continue her home medication and ciprofloxacin until she finishes the antibiotic course.  5.  Diabetes mellitus with current unsteady gait, probably from diabetic neuropathy. Physical therapy consult is placed for evaluation.  6.  Hypertension: Blood pressure is slightly elevated, but will allow permissive hypertension as we are worried about a transient ischemic attack. Will obtain an MRI of the brain in the a.m. and resume her home medications for gout, and continue prednisone for rheumatoid arthritis.  7.  We will provide her deep vein thrombosis prophylaxis and gastrointestinal prophylaxis.  8.  She is DO NOT RESUSCITATE. We will turn over the patient to Dr. Netty Starring from [place] in the a.m.   Total time spent on admission is 50 minutes. The diagnosis and plan of care was discussed in detail with the patient and her daughter at the bedside. They verbalized understanding of the plan.    ____________________________ Nicholes Mango, MD ag:dm D: 09/14/2012 00:04:00 ET T: 09/14/2012 08:13:02 ET JOB#: [id number]  cc: Dion Body, MD Nicholes Mango, MD, <Dictator>   Nicholes Mango MD ELECTRONICALLY SIGNED 09/20/2012 1:17

## 2014-10-22 NOTE — Discharge Summary (Signed)
PATIENT NAME:  [name], [name] MR#:  [id number] DATE OF BIRTH:  03/08/1932  DATE OF ADMISSION:  09/14/2012 DATE OF DISCHARGE:  09/16/2012  DISCHARGE DIAGNOSES: 1.  Recurrent transient ischemic attack like symptoms.  2.  Rheumatoid arthritis.  3.  Adult onset diabetes.  4.  Hypertension.  5.  Hyperlipidemia.  6.  Osteoarthritis.  7.  Macular degeneration.  8.  Gout.  9.  Anxiety/depression.   DISCHARGE MEDICATIONS: 1.  Methotrexate 2.5 mg 4 tabs p.o. weekly.  2.  Metformin 500 mg p.o. daily with food.  3.  Centrum vitamin daily. 4.  Allopurinol 300 mg p.o. daily. 5.  Folic acid 1 mg 2 tabs p.o. daily.  6.  Lovastatin 40 mg p.o. at bedtime.  7.  Aspirin 81 mg p.o. daily.  8.  Acetaminophen 500 mg 2 tabs p.o. q. 6 hours p.r.n. for pain.  9.  Prednisone 5 mg 1/2 tab p.o. daily.  10.  Lisinopril 40 mg p.o. daily.  11.  Paroxetine 30 mg p.o. at bedtime.  12.  Plavix 75 mg p.o. daily.  13.  Carvedilol 3.125 mg p.o. b.i.d.   CONSULTANTS: Neurology.   PROCEDURES: MRI was negative. EEG was nonspecific.  Did show some generalized slowing.   PERTINENT LABS:  On day of discharge, sodium 139, potassium 3.8, creatinine 0.72 and glucose 104.   BRIEF HOSPITAL COURSE:  1.  TIA-like symptoms. The patient came back into the hospital after recent hospitalization for similar TIA-like symptoms. Had trouble talking with also associated headache. MRI of the brain was negative for acute process. Neurology was consulted and did not have any further recommendations besides the Plavix and the statin that she was already on. EEG was negative for acute changes significant for seizure. Did show some nonspecific slowing. No further recommendations from that standpoint. I think most likely her symptoms are related to her underlying anxiety. I did increase the paroxetine to 30 mg during her hospital stay. Her symptoms did not reoccur.  Will continue on this regimen.  2.  Hypertension. Her blood pressure was  elevated. This was allowed to run a little bit higher when this was initially thought to be truly a TIA.  Since this is less likely to be a TIA, her lisinopril was increased and her metoprolol was switched over to Carvedilol because of her bradycardia and uncontrolled blood pressure.  3.  Her other chronic medical issues remained stable. No changes to those regimens.   DISPOSITION: She is in stable condition to be discharged to home.   DISCHARGE FOLLOWUP: With Dr. Netty Starring within 10 days.  ____________________________ Dion Body, MD kl:sb D: 09/16/2012 11:05:58 ET T: 09/16/2012 11:26:17 ET JOB#: [id number]  cc: Dion Body, MD, <Dictator> Dion Body MD ELECTRONICALLY SIGNED 10/10/2012 10:00

## 2014-10-22 NOTE — H&P (Signed)
PATIENT NAME:  [name], POSCH MR#:  [id number] DATE OF BIRTH:  1932-03-27  DATE OF ADMISSION:  05/09/2013  REFERRING PHYSICIAN: Dr. Charlesetta Ivory.   PRIMARY CARE PHYSICIAN: Dr. Netty Starring   CHIEF COMPLAINT: Not feeling well.   HISTORY OF PRESENT ILLNESS: This is a 81 year old Caucasian female with past medical history of diabetes, hypertension, rheumatoid arthritis on chronic steroids who is presenting with fever and generalized malaise. She had one day duration of generalized malaise. She became progressively nauseated, followed by nonbloody, nonbilious emesis with associated subjective fever and chills. She also became more lethargic during the course of the day. Brought to the Emergency Department via EMS, found to be febrile and hypoxemic. The patient is unable to add any further information secondary to current medical condition and mental status, and daughter at bedside confirms this story and states that yesterday she was in her normal state of health without complaints.   REVIEW OF SYSTEMS: Unable to obtain secondary to patient's current medical condition and mental status.   PAST MEDICAL HISTORY: Type 2 diabetes non-insulin-requiring, hypertension, rheumatoid arthritis on chronic steroid therapy, paroxysmal atrial fibrillation, macular degeneration.   FAMILY HISTORY: Chronic obstructive pulmonary disease, hypertension, and colon cancer.   SOCIAL HISTORY: Denied any alcohol, tobacco or drug usage.   ALLERGIES: PENICILLIN, WHICH CAUSES BLURRY VISION.   HOME MEDICATIONS: Allopurinol 300 mg p.o. daily, aspirin 81 mg p.o. daily, Coreg 3.125 mg p.o. b.i.d., Colcrys 0.6 mg p.o. as need for gout symptoms, folic acid 0.8 mg p.o. daily, losartan 50 mg p.o. daily, lovastatin 40 mg p.o. at bedtime, metformin 500 mg p.o. daily, methotrexate 2.5 mg p.o. 4 tablets once weekly on Wednesdays, paroxetine 30 mg p.o. at bedtime, prednisone 5 mg 1/2 tablet p.o. daily, Visine 0.05/0.25% ophthalmic  solution two drops to each eye 4 times daily.   PHYSICAL EXAMINATION:  VITAL SIGNS: Temperature 103 degrees Fahrenheit, heart rate 101, respirations 18, blood pressure 172/75, saturating 98% on 2 liters nasal cannula, weight 74.8 kg, BMI 29.2.  GENERAL: Chronically ill-appearing, older than stated age 42 female, who is in mild to moderate distress secondary to mental status.  HEAD: Normocephalic, atraumatic.  EYES: Pupils equal, round, and reactive to light. Extraocular muscles intact. No scleral icterus.  MOUTH: Dry mucosal membranes. Dentition intact. No abscess noted.  EARS, NOSE, AND THROAT: Throat clear without exudate. No external lesions.  NECK: Supple. No thyromegaly. No nodules. No JVD.  PULMONARY: Coarse breath sounds at the left base. No other adventitial breath sounds. No use of accessory muscles. Good respiratory effort.  CHEST: Nontender to palpation.  CARDIOVASCULAR: S1 and S2, tachycardic. No murmur, rub or gallop. No edema. Pedal pulses 2+ bilaterally.  GASTROINTESTINAL: Soft, nontender, nondistended. No masses. Positive bowel sounds. No hepatosplenomegaly.  MUSCULOSKELETAL: No swelling, clubbing or edema. Range of motion full in all extremities.  NEUROLOGICAL: The patient has gross movements of all extremities with full range of motion and able to follow simple commands on occasion.  SKIN: No ulcerations, lesions, rashes, cyanosis. Skin is warm and dry. Turgor is intact.  PSYCHIATRIC: The patient is lethargic, repeatedly falling asleep during exam, will stay awake with painful stimuli. Difficult to respond with verbal stimuli. Answers simple yes or no questions and immediately falls back asleep. At this time, insight and judgment are poor secondary to medical condition.   LABORATORY DATA: Sodium 134, potassium 3.8, chloride 100, bicarbonate 26, BUN 18, creatinine 0.8, glucose 195. LFTs within normal limits. WBC 11.5, hemoglobin 13.7, platelets of 146. Urinalysis negative  for evidence of infection.   EKG performed, revealing sinus tachycardia, heart rate 109, incomplete right bundle branch.  Chest x-ray revealing cardiomegaly as well as interstitial infiltrates at the left lower lobe with associated small pleural effusion.   ASSESSMENT AND PLAN: A 81 year old Caucasian female with past medical history of rheumatoid arthritis, who is on chronic steroids and methotrexate, as well as diabetes, who is presenting with a feeling of generalized malaise, found to be septic on arrival, including hypoxemic. Chest x-ray revealing left lower lobe effusion and possible infiltrate.  1.  Sepsis, likely secondary to pulmonary source. Panculture, including blood and sputum. Will be checking strep and Legionella urinary antigens. Antibiotic coverage with Levaquin and Zosyn, given history of chronic steroid usage, raising concern for Pseudomonas. Intravenous fluids to keep mean arterial pressure greater than 65.  2.  Acute hypoxemic respiratory failure secondary to community-acquired pneumonia. Treatment course as outlined above, also provide DuoNeb therapy q.4h., and supplemental O2 to keep oxygen saturation greater than 92%.  3.  Type 2 diabetes. Will hold oral  agents. Add insulin sliding scale and q.6 hour Accu-Cheks with goal blood glucose 120 to 180 in critical care setting. 4.  Rheumatoid arthritis. We will provide stress dose steroids in critical care setting, given chronic usage steroids.  5.  Hypertension code.  Continue Coreg. Will hold losartan, given relative hypotension. On arrival, the patient's blood pressure was in the 170s,  currently in the 120s.  6.  Deep venous thrombosis prophylaxis with heparin subcutaneous.   CODE STATUS: The patient is a DO NOT RESUSCITATE, confirmed by daughter at bedside.  CRITICAL CARE TIME: 45 minutes     ____________________________ Aaron Mose. Aliveah Gallant, MD dkh:cg D: 05/09/2013 02:33:29 ET T: 05/09/2013 03:08:26  ET JOB#: [id number]  cc: Aaron Mose. Shalayah Beagley, MD, <Dictator> Kenichi Cassada Woodfin Ganja MD ELECTRONICALLY SIGNED 05/10/2013 0:24

## 2014-10-22 NOTE — Discharge Summary (Signed)
PATIENT NAME:  [name], [name] MR#:  [id number] DATE OF BIRTH:  1932-06-29  DATE OF ADMISSION:  09/10/2012 DATE OF DISCHARGE:  09/10/2012   DISCHARGE DIAGNOSES:  1.  Transient ischemic attack.  2.  Rheumatoid arthritis.  3.  Adult onset diabetes.  4.  Hypertension.  5.  Hyperlipidemia.  6.  Osteoarthritis.  7.  Macular degeneration.  8.  Gout.  9.  Anxiety, depression.   DISCHARGE MEDICATIONS:  1.  Lisinopril 20 mg p.o. daily.  2.  Methotrexate 2.5 mg 4 tabs p.o. weekly with meals on Wednesday. 3.  Metformin 500 mg p.o. daily with meals.  4.  Metoprolol tartrate 100 mg p.o. b.i.d.   5.  Centrum Silver daily.  6.  Allopurinol 300 mg p.o. daily.  7.  Folic acid 1 mg 2 tabs p.o. daily.  8.  Lovastatin 40 mg p.o. at bedtime.  9.  Aspirin 81 mg p.o. daily.  10.  Acetaminophen 500 mg 2 tabs p.o. q.6 hours as needed for pain. 11.  Paroxetine 20 mg p.o. at bedtime.  12.  Prednisone 5 mg 1/2 tab p.o. daily.  13.  Ciprofloxacin 250 mg p.o. b.i.d., take as directed.   CONSULTS: None.   PROCEDURES: None.   PERTINENT LABORATORIES AND STUDIES: The patient had carotid studies done that were negative. Also had a CT of the head that showed no acute abnormalities. MRI is pending on day of discharge.   BRIEF HOSPITAL COURSE:  1.  TIA: The patient initially was admitted with dysarthria, which spontaneously resolved while in the Emergency Room. CT of the head was negative. She had no other focal deficits on exam. MRI is pending of the brain. Carotid studies were negative. The patient will be discharged home with focus on cardiovascular risk factors. Will report the MRI results once they are available.  2.  Hypertension: The patient was noted to have elevated blood pressure. We purposely allow it to run in the 160s because of this possible TIA. Once the MRI report is back and if it is normal, we will try to get her below 140/90.  3.  Other chronic medical issues remained stable at this time. No  changes to those regimens.   DISPOSITION: She is in stable condition and will be discharged to home. No rehab needed. Follow up with Dr. Netty Starring within 10 days.    ____________________________ Dion Body, MD kl:jm D: 09/10/2012 12:38:00 ET T: 09/10/2012 13:33:34 ET JOB#: [id number]  cc: Dion Body, MD, <Dictator> Dion Body MD ELECTRONICALLY SIGNED 10/10/2012 10:00

## 2014-10-23 NOTE — Consult Note (Signed)
Reason for Visit: This 81 year old Female patient presents to the clinic for initial evaluation of  breast cancer .   Referred by Dr. Tamala Julian.  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 N0 M0.  Pathology Report pathology report reviewed   Imaging Report mammograms reviewed   Referral Report clinical notes reviewed   Planned Treatment Regimen adjuvant whole breast radiation   HPI   patient is a 81 year old female who presents with an abnormal mammogram of the right breast showing an irregular spiculated 1.5 cm mass. This was palpated by the radiologist also ultrasound confirmed a 1.2 x 0.8 x 1.2 cm mass in the upper-outer quadrant.Gen. an ultrasound-guided biopsy which was positive for invasive mammary carcinoma triple negative disease. She went to a wide local excision for a 1.2 cm grade 2 lesion 9 sentinel node lymph nodes showed no evidence to suggest metastatic disease. She's been seen by medical oncology based on her age and triple negative nature of her disease and no her on ACE inhibitor therapy will be recommended and no systemic chemotherapy. She is seen today for radiation opinion. She is doing well. Still somewhat tender although scar is healing well. Patient does have a history of chronic fatigue also past medical history significant for hypertension, rheumatoid arthritis type 2 diabetes interstitial lung disease. She's also had multiple benign biopsies in the past of her breasts.  Past Hx:    arthritis:    influenza:    phlebitis:    hemorrhoids:    irregular heart beat:    constipation:    pneumonia:    macular degeneration:    Diabetes Mellitus:    Gout:    HTN:    hysterectomy: partial  Past, Family and Social History:  Past Medical History positive   Cardiovascular  hyperlipidemia; hypertension; irregular heartbeat   Respiratory pneumonia; interstitial lung disease   Gastrointestinal constipation,   Endocrine diabetes mellitus   Neurological/Psychiatric anxiety; depression; lumbar spinal stenosis   Past Surgical History hysterectomy, hemorrhoids   Past Medical History Comments gout, macular degeneration, osteoarthritis   Family History positive   Family History Comments cyst with ovarian cancer in her 35s also family history of gastric cancer   Social History noncontributory   Additional Past Medical and Surgical History accompanied by daughter today   Allergies:   Penicillin: Other  Biaxin: Rash  sulfa topicals: Rash  Lisinopril: Cough  Home Meds:  Home Medications: Medication Instructions Status  Aspirin Enteric Coated 325 mg oral delayed release tablet 1 tab(s) orally once a day Active  carvedilol 3.125 mg oral tablet 1 tab(s) orally 2 times a day Active  PARoxetine 30 milligram(s) orally once a day (at bedtime) Active  losartan 50 mg oral tablet 1 tab(s) orally once a day Active  predniSONE 5 mg oral delayed release tablet 1/2 tab(s) orally every other day Active  Humira Pen 40 mg/0.8 mL subcutaneous kit 1 dose(s) subcutaneous every 2 weeks Active  lovastatin 40 mg oral tablet 1 tab(s) orally once a day (at bedtime) for cholesterol Active  multivitamin 1 tab(s) orally once a day Active  metformin 500 mg oral tablet 1 tab(s) orally 2 times a day Active  folic acid 0.4 mg oral tablet 1 tab(s) orally once a day Active  methotrexate 2.5 mg tablet 6 tab(s) orally once a week Active  norco  one to two tablets every 4 hrs as needed for pain Active   Review of Systems:  General negative   Performance Status (ECOG) 0   Skin negative   Breast see HPI   Ophthalmologic see HPI   ENMT negative   Respiratory and Thorax negative   Cardiovascular negative   Gastrointestinal negative   Genitourinary negative    Musculoskeletal negative   Neurological negative   Hematology/Lymphatics negative   Endocrine negative   Allergic/Immunologic negative   Review of Systems   denies any weight loss, fatigue, weakness, fever, chills or night sweats. Patient denies any loss of vision, blurred vision. Patient denies any ringing  of the ears or hearing loss. No irregular heartbeat. Patient denies heart murmur or history of fainting. Patient denies any chest pain or pain radiating to her upper extremities. Patient denies any shortness of breath, difficulty breathing at night, cough or hemoptysis. Patient denies any swelling in the lower legs. Patient denies any nausea vomiting, vomiting of blood, or coffee ground material in the vomitus. Patient denies any stomach pain. Patient states has had normal bowel movements no significant constipation or diarrhea. Patient denies any dysuria, hematuria or significant nocturia. Patient denies any problems walking, swelling in the joints or loss of balance. Patient denies any skin changes, loss of hair or loss of weight. Patient denies any excessive worrying or anxiety or significant depression. Patient denies any problems with insomnia. Patient denies excessive thirst, polyuria, polydipsia. Patient denies any swollen glands, patient denies easy bruising or easy bleeding. Patient denies any recent infections, allergies or URI. Patient "s visual fields have not changed significantly in recent time.   Physical Exam:  General/Skin/HEENT:  General normal   Skin normal   Eyes normal   ENMT normal   Head and Neck normal   Additional PE well-developed elderly female in NAD. She status post wide local excision of the right breast with incision healing well.no dominant mass or nodularity is noted inr breast in 2 positions examined. Axillary incision scar is healing well. No cervical or supraclavicular or axillary adenopathy is appreciated. Lungs are clear to A&P cardiac examination  shows irregular irregular heartbeat.   Breasts/Resp/CV/GI/GU:  Respiratory and Thorax normal   Cardiovascular normal   Gastrointestinal normal   Genitourinary normal   MS/Neuro/Psych/Lymph:  Musculoskeletal normal   Neurological normal   Lymphatics normal   Other Results:  Radiology Results: [place]:    05-Aug-15 13:50, Digital Diagnostic Mammogram Bilateral  Digital Diagnostic Mammogram Bilateral   REASON FOR EXAM:    LT BR PAIN UQ AND YRLY  COMMENTS:       PROCEDURE: MAM - MAM DGTL DIAGNOSTIC MAMMO W/CAD  - Feb 03 2014  1:50PM     CLINICAL DATA:  Patient presents for evaluation of palpable  abnormality within the left breast.    EXAM:  DIGITALDIAGNOSTIC  BILATERAL MAMMOGRAM WITH CAD    ULTRASOUND BILATERAL BREAST    COMPARISON:  [facility]  ACR Breast Density Category c: The breast tissue is heterogeneously  dense, which may obscure small masses.    FINDINGS:  No concerning abnormality underlying the palpable marker within the  left breast on spot tangential view.    Within the upper-outer right breast posterior depth there is an  irregular spiculated 1.5 cm mass.    Bilateral coarse calcifications most compatible with degenerating  fibroadenomas.    Mammographic images were processed with CAD.  On physical exam, I palpate a small mass within the left breast  10  o'clock position. I palpate no discrete mass within the upper-outer  right breast.    Ultrasound is performed, showing a 1.2x 0.8 x 1.2 cm peripherally  calcified mass within the left breast 10 o'clock position 10 cm the  nipple corresponding with palpable abnormality. This represents a  benign calcified fibroadenoma.    Within the right breast 10 o'clock position 15 cm from the nipple is  a 0.9 x 0.8 x 0.7 cm oval taller than wide hypoechoic mass with  peripheral surrounding echogenicity.    No right axillary adenopathy.   IMPRESSION:  Suspicious right breast mass.    Palpable  abnormality within the left breast corresponds with a  benign fibroadenoma.    RECOMMENDATION:  Ultrasound-guided core needle biopsy suspicious right breast mass.    This will scheduled at the patient's convenience.    I have discussed the findings and recommendations with the patient.  Results were also provided in writing at the conclusion of the  visit. If applicable, a reminder letter will be sent to the patient  regarding the next appointment.  BI-RADS CATEGORY  4: Suspicious.      Electronically Signed    By: Lovey Newcomer M.D.    On: 02/03/2014 16:14         Verified By: Ilsa Iha, M.D.,   Relevent Results:   Relevant Scans and Labs mammogram and ultrasound reviewed   Assessment and Plan: Impression:   stage I invasive mammary carcinoma triple negative status post wide local excision sentinel node biopsy an 81 year old female for adjuvant whole breast radiation. Plan:   at this time I recommend whole breast radiation. Patient has a rather large breasts and would make hypofractionated treatment difficult. We'll plan on delivering 5000 cGy over 5 weeks to her right breast posterior scar no 1400 cGy with electron beam. Risks and benefits of treatment were reviewed with the patient and she seems to comprehend my treatment plan well. Side effects such as alteration of blood counts, inclusion of some superficial lung, erythematous changes of the breast. Fatigued all were discussed in detail with the patient. She seems to comprehend my treatment plan well. I've ordered and set her up for CT simulation and this week. Patient will not be a candidate for aromatase inhibitor therapy based on the triple negative nature of her disease.  I would like to take this opportunity for allowing me to participate in the care of your patient..  Fax to Physician:  Physicians To Recieve Fax: Erby Pian, MD - [phone] Dion Body, MD - [phone] Rochel Brome -  [phone].  Electronic Signatures: Jozee Hammer, Roda Shutters (MD)  (Signed 13-Oct-15 13:32)  Authored: HPI, Diagnosis, Past Hx, PFSH, Allergies, Home Meds, ROS, Physical Exam, Other Results, Relevent Results, Encounter Assessment and Plan, Fax to Physician   Last Updated: 13-Oct-15 13:32 by Armstead Peaks (MD)

## 2014-10-23 NOTE — Op Note (Signed)
PATIENT NAME:  [name], [name] MR#:  [id number] DATE OF BIRTH:  [birth year]  DATE OF PROCEDURE:  03/26/2014  PREOPERATIVE DIAGNOSIS: Carcinoma of the right breast.   POSTOPERATIVE DIAGNOSIS: Carcinoma of the right breast.   PROCEDURE: Right partial mastectomy with axillary sentinel lymph node biopsy.   SURGEON: Rochel Brome, MD.   ANESTHESIA: General.   INDICATIONS: This 81 year old female recently had a mammogram depicting a mass in the upper-outer quadrant of the left breast. This is also demonstrated on ultrasound. She had ultrasound-guided core needle biopsy demonstrating invasive mammary carcinoma and surgery was recommended for definitive treatment.   DESCRIPTION OF PROCEDURE: The patient was placed on the operating table in the supine position under general anesthesia. She had had preoperative injection of radioactive technetium sulfur colloid. She also had insertion of a Kopans wire, which was in the peripheral lateral aspect of the right breast. Her mammograms were viewed in the OR prior to incision demonstrating location of the Kopans wire in the upper-outer quadrant with the thick part of the wire adjacent to the biopsy marker and the mass seen on the mammogram adjacent to the marker. The Kopans wire was cut 2 cm from the skin. The right arm was placed on a lateral arm support. The right breast, upper arm and chest wall were prepared with ChloraPrep and draped in a sterile manner.   The incision was made from 8 o'clock to 10 o'clock position of the peripheral aspect of the right breast, just medial to the insertion point of the Kopans wire. A narrow ellipse of skin was removed with the underlying tissue. Dissection was carried down through subcutaneous tissues. Electrocautery was used for hemostasis. The Kopans wire was encountered. Next, dissection was carried out around the Kopans wire and could palpate a mass within the breast and the palpation helped to guide the direction of the  dissection. The mass of tissue was excised. The 10 o'clock end of the skin ellipse was tagged with a nylon stitch for the pathologist's orientation. Also the specimen was tagged with margin maps, suturing markers to the specimen to mark the medial, lateral, cranial, caudal and deep margins. This was submitted for specimen mammogram and pathology. The wound was inspected and hemostasis appeared to be intact.   Attention was turned to the axilla. The gamma counter was used to demonstrate location of radioactivity in the inferior aspect of the axilla. An oblique incision was made approximately 6 cm in length, carried down through subcutaneous tissues and dissected deeply within the axilla using the gamma counter for direction and encountered an area of radioactivity. There was a portion of fatty tissue containing a palpable small lymph node that was resected. The ex vivo count was in the range of 20 to 30. This was submitted as sentinel lymph node #1. There was remaining background count in the range of 20 to 40 and additional dissection was carried out and identified additional lymph node tissue with radioactivity and resected another portion of tissue containing some fatty tissue. This was excised and the ex vivo count was in the range of 9 to 20.   This was placed in the second container. There was some additional radioactivity found, but no specific site. There were some other small lymph nodes, which dissected free from surrounding structures and placed in the second container. There was no remaining palpable mass within the axilla. Hemostasis appeared to be intact. It is noted that the x-ray department called back to report that the specimen mammogram appeared  to be satisfactory, containing the wire, the mass, the marker and was submitted for pathology. The pathologist called back to say that margins appeared to be approximately a centimeter surrounding the mass. Both wounds were inspected. Both wounds were  injected with 0.5% Sensorcaine with epinephrine in the subcutaneous tissues.   Next, the breast wound was closed using 4-0 chromic in the subcutaneous tissues and running 4-0 Monocryl as a subcuticular stitch. Next, the axillary wound was further inspected. Hemostasis was intact. Subcutaneous tissues were closed with 4-0 chromic and the skin was closed with running 4-0 Monocryl subcuticular suture. Both wounds were treated with Dermabond. The patient appeared to tolerate the procedure satisfactorily and was then prepared for transfer to the recovery room.    ____________________________ Lenna Sciara. Rochel Brome, MD jws:TT D: 03/26/2014 14:51:48 ET T: 03/26/2014 16:53:48 ET JOB#: [id number]  cc: Loreli Dollar, MD, <Dictator> Loreli Dollar MD ELECTRONICALLY SIGNED 03/26/2014 18:01

## 2014-10-23 NOTE — H&P (Signed)
PATIENT NAME:  [name], [name] MR#:  [id number] DATE OF BIRTH:  1931-10-03  DATE OF ADMISSION:  02/27/2014  REFERRING PHYSICIAN: Corky Sing, PA-C  PRIMARY CARE PHYSICIAN: Dion Body, MD, [facility].  CHIEF COMPLAINT: Speech difficulty.   HISTORY OF PRESENT ILLNESS: An 81 year old Caucasian female with a history of rheumatoid arthritis; type 2 diabetes, non-insulin-requiring, uncomplicated; hypertension; paroxysmal atrial fibrillation; presenting with speech difficulty. She describes acute onset of blurred vision which she described as bilateral, right field deficit, blurred vision, with associated speech difficulty which she described as unable to get words out. She knew the words that she wanted to say but unable to express them. In total, her symptoms lasted 10 minutes; now completely resolved. With the above, decided to present to the hospital for further workup and evaluation. Of note, she has not been taking her aspirin for about the last 2-3 weeks.  REVIEW OF SYSTEMS:  CONSTITUTIONAL: Denies fevers, chills, fatigue, weakness.  EYES: Denies current blurred vision, double vision, eye pain.  EARS, NOSE, THROAT: Denies tinnitus, ear pain, hearing loss.  RESPIRATORY: Denies coughing, shortness of breath.  CARDIOVASCULAR: Denies chest pain, palpitations, edema.  GASTROINTESTINAL: Denies nausea, vomiting, diarrhea, abdominal pain.  GENITOURINARY: Denies dysuria or hematuria. ENDOCRINE: Denies nocturia, thyroid problems. HEMATOLOGY AND LYMPHATIC: Denies easy bruising, bleeding. SKIN: Denies rashes, lesions.  MUSCULOSKELETAL: Denies pain in neck, back, shoulder, knees, hips, or arthritic symptoms.  NEUROLOGIC: Denies paralysis, paresthesias. PSYCHIATRIC: Denies anxiety or depressive symptoms.  Otherwise, full review of systems performed by me is negative.  PAST MEDICAL HISTORY: Rheumatoid arthritis on chronic steroid therapy; paroxysmal atrial fibrillation not on  anticoagulation; hypertension; type 2 diabetes that is non-insulin-requiring, uncomplicated.   SOCIAL HISTORY: Denies alcohol, tobacco, or drug usage.   FAMILY HISTORY: Positive for COPD as well as hypertension.   ALLERGIES: BIAXIN, LISINOPRIL, PENICILLIN, AND SULFA DRUGS.   HOME MEDICATIONS: Include prednisone 2.5 mg every other day, aspirin 81 mg p.o. q. daily which she has not been taking for the last 2-3 weeks. Losartan 50 mg p.o. q. daily, paroxetine 30 mg p.o. at bedtime, metformin 500 mg p.o. q. daily, allopurinol 300 mg p.o. q. daily, lovastatin 40 mg p.o. at bedtime, methotrexate 2.5 mg 4 tablets q. weekly on Wednesdays, Coreg 3.125 mg p.o. b.i.d., metoprolol unknown dose once daily, Humira 40 mg subcutaneous every 2 weeks, folic acid 0.8 mg p.o. q. daily.   PHYSICAL EXAMINATION:  VITAL SIGNS: Temperature 98.5, heart rate 88, respirations 20, blood pressure 157/85, saturating 95% on room air. Weight 55.8 kg, BMI 26.6.  GENERAL: Well-nourished, well-developed, Caucasian female, currently in no acute distress.  HEAD: Normocephalic, atraumatic.  EYES: Pupils equal, round, reactive to light. Extraocular muscles intact. No scleral icterus.  MOUTH: Moist mucous membranes. Dentition intact. No abscess noted. EAR, NOSE, THROAT: Clear without exudates, ulcer, or lesions.  NECK: Supple. No thyromegaly, no nodules, no JVD.  PULMONARY: Clear to auscultation bilaterally without wheezes, rales, rhonchi. No use of accessory muscles. Good respiratory effort.  CHEST: Nontender to palpation.  CARDIOVASCULAR: S1, S2, regular rate and rhythm. No murmurs, rubs, or gallops. No edema. Pedal pulses 2+ bilaterally.  GASTROINTESTINAL: Soft, nontender, nondistended. No masses. Positive bowel sounds. No hepatosplenomegaly.  MUSCULOSKELETAL: No swelling, clubbing, or edema. Range of motion full in all extremities.  NEUROLOGIC: Cranial nerves II through XII intact. No gross focal neurologic deficits. Sensation to  everything intact. Pronator drift within normal limits. Strength 5/5 in all extremities, including proximal and distal flexion and tension.  SKIN: No ulcerations,  lesions, rashes, or cyanosis. Skin warm and dry. Turgor intact.  PSYCHIATRIC: Mood and affect within normal limits. The patient is awake, alert, oriented x 3. Insight and judgment intact.   LABORATORY DATA: CT, head, performed revealing no acute intracranial process. Remainder of laboratory data: Sodium 137, potassium 3.7, chloride 101, bicarbonate 23, BUN 14, creatinine 0.87, glucose 135. LFTs within normal limits. WBC 7.2, hemoglobin 12.8, platelets of 175,000. Urinalysis negative for evidence of infection.   ASSESSMENT AND PLAN: An 81 year old Caucasian female with a history of rheumatoid arthritis, type 2 diabetes, hypertension, paroxysmal atrial fibrillation, presenting with transient speech and vision changes.  1.  Transient ischemic attack. Admit to telemetry under observational status. Initiate aspirin and statin therapy. Once again, she has not taken her aspirin in about 2-3 weeks. Check an MRI of the brain without contrast, carotid Doppler, fluid panel, neuro checks q. 4 hours, and searching for risk factors and further etiology of her symptoms.  2.  Type 2 diabetes, non-insulin-requiring, uncomplicated. Will hold her p.o. agents. Add insulin sliding scale with q. 6 hour Accu-Cheks while in the hospital. 3.  Hypertension. Continue her home medications.  4.  Paroxysmal atrial fibrillation, currently in normal sinus rhythm. Watch on telemetry. 5.  Rheumatoid arthritis. Continue with prednisone.  6.  Venous thromboembolism prophylaxis with sequential compression devices.  TOTAL TIME SPENT: 45 minutes.   ____________________________ Aaron Mose. Hower, MD dkh:ST D: 02/27/2014 22:28:27 ET T: 02/27/2014 23:00:38 ET JOB#: [id number]  cc: Aaron Mose. Hower, MD, <Dictator> DAVID Woodfin Ganja MD ELECTRONICALLY SIGNED 03/02/2014 1:41

## 2015-01-13 ENCOUNTER — Other Ambulatory Visit: Payer: Self-pay | Admitting: *Deleted

## 2015-01-13 ENCOUNTER — Encounter: Payer: Self-pay | Admitting: Radiation Oncology

## 2015-01-13 ENCOUNTER — Ambulatory Visit
Admission: RE | Admit: 2015-01-13 | Discharge: 2015-01-13 | Disposition: A | Discharge status: Home / Self Care | Payer: Medicare Other | Source: Ambulatory Visit | Attending: Radiation Oncology | Admitting: Radiation Oncology

## 2015-01-13 VITALS — BP 144/76 | HR 78 | Temp 95.3°F | Resp 20 | Ht 61.0 in | Wt 150.4 lb

## 2015-01-13 DIAGNOSIS — C50911 Malignant neoplasm of unspecified site of right female breast: Secondary | ICD-10-CM

## 2015-01-13 HISTORY — DX: Malignant neoplasm of unspecified site of unspecified female breast: C50.919

## 2015-01-13 HISTORY — DX: Type 2 diabetes mellitus without complications: E11.9

## 2015-01-13 NOTE — Progress Notes (Signed)
Radiation Oncology Follow up Note  Name: [name]   Date:   01/13/2015 MRN:  [id number] DOB: [birth year]    This 81 y.o. female presents to the clinic today for follow-up for triple negative invasive mammary carcinoma of the right breast stage I (T1 CN 0 M0) status post whole breast radiation.  REFERRING PROVIDER: No ref. provider found  HPI: patient is a in 81 year old female now out 6 months having completed radiation therapy to her right breast for stage I invasive mammary carcinoma. Tumor was triple negative. She seen today in routine follow-up and is doing well. She specifically denies breast tenderness cough or bone pain. Has not been back to her surgeon for some reason has not had any follow-up mammograms. Based on the triple negative nature of her lesion she is not on aromatase inhibitor..  COMPLICATIONS OF TREATMENT: none  FOLLOW UP COMPLIANCE: keeps appointments   PHYSICAL EXAM:  BP 144/76 mmHg  Pulse 78  Temp(Src) 95.3 F (35.2 C)  Resp 20  Ht 5\' 1"  (1.549 m)  Wt 150 lb 5.7 oz (68.2 kg)  BMI 28.42 kg/m2 Lungs are clear to A&P cardiac examination essentially unremarkable with regular rate and rhythm. No dominant mass or nodularity is noted in either breast in 2 positions examined. Incision is well-healed. No axillary or supraclavicular adenopathy is appreciated. Cosmetic result is excellent. Well-developed well-nourished patient in NAD. HEENT reveals PERLA, EOMI, discs not visualized.  Oral cavity is clear. No oral mucosal lesions are identified. Neck is clear without evidence of cervical or supraclavicular adenopathy. Lungs are clear to A&P. Cardiac examination is essentially unremarkable with regular rate and rhythm without murmur rub or thrill. Abdomen is benign with no organomegaly or masses noted. Motor sensory and DTR levels are equal and symmetric in the upper and lower extremities. Cranial nerves II through XII are grossly intact. Proprioception is intact. No  peripheral adenopathy or edema is identified. No motor or sensory levels are noted. Crude visual fields are within normal range.   RADIOLOGY RESULTS: I have ordered diagnostic bilateral mammograms  PLAN: at this time I'm setting her up to see her surgeon after her mammograms are performed. I'm please were overall progress. She continues to do well with no evidence of disease. I've asked to see her back in 6 months for follow-up. Patient knows to call sooner with any concerns.  I would like to take this opportunity for allowing me to participate in the care of your patient.Armstead Peaks., MD

## 2015-02-07 ENCOUNTER — Ambulatory Visit: Payer: Medicare Other

## 2015-02-07 ENCOUNTER — Other Ambulatory Visit: Payer: Medicare Other

## 2015-02-14 ENCOUNTER — Ambulatory Visit: Admission: RE | Admit: 2015-02-14 | Payer: Medicare Other | Source: Ambulatory Visit

## 2015-02-14 ENCOUNTER — Other Ambulatory Visit: Payer: Self-pay | Admitting: Radiation Oncology

## 2015-02-14 ENCOUNTER — Ambulatory Visit
Admission: RE | Admit: 2015-02-14 | Discharge: 2015-02-14 | Disposition: A | Discharge status: Home / Self Care | Payer: Medicare Other | Source: Ambulatory Visit | Attending: Radiation Oncology | Admitting: Radiation Oncology

## 2015-02-14 DIAGNOSIS — C50911 Malignant neoplasm of unspecified site of right female breast: Secondary | ICD-10-CM

## 2015-02-14 DIAGNOSIS — R921 Mammographic calcification found on diagnostic imaging of breast: Secondary | ICD-10-CM | POA: Diagnosis not present

## 2015-02-14 DIAGNOSIS — Z853 Personal history of malignant neoplasm of breast: Secondary | ICD-10-CM | POA: Diagnosis not present

## 2015-02-16 ENCOUNTER — Other Ambulatory Visit: Payer: Self-pay | Admitting: Radiation Oncology

## 2015-02-16 DIAGNOSIS — R921 Mammographic calcification found on diagnostic imaging of breast: Secondary | ICD-10-CM

## 2015-02-16 DIAGNOSIS — R928 Other abnormal and inconclusive findings on diagnostic imaging of breast: Secondary | ICD-10-CM

## 2015-02-21 ENCOUNTER — Ambulatory Visit
Admission: RE | Admit: 2015-02-21 | Discharge: 2015-02-21 | Disposition: A | Discharge status: Home / Self Care | Payer: Medicare Other | Source: Ambulatory Visit | Attending: Radiation Oncology | Admitting: Radiation Oncology

## 2015-02-21 DIAGNOSIS — Z853 Personal history of malignant neoplasm of breast: Secondary | ICD-10-CM | POA: Insufficient documentation

## 2015-02-21 DIAGNOSIS — R928 Other abnormal and inconclusive findings on diagnostic imaging of breast: Secondary | ICD-10-CM | POA: Diagnosis present

## 2015-02-21 DIAGNOSIS — R921 Mammographic calcification found on diagnostic imaging of breast: Secondary | ICD-10-CM | POA: Insufficient documentation

## 2015-02-21 HISTORY — PX: BREAST BIOPSY: SHX20

## 2015-02-24 LAB — SURGICAL PATHOLOGY

## 2015-08-02 ENCOUNTER — Encounter: Payer: Self-pay | Admitting: *Deleted

## 2015-08-02 DIAGNOSIS — I809 Phlebitis and thrombophlebitis of unspecified site: Secondary | ICD-10-CM | POA: Insufficient documentation

## 2015-08-02 DIAGNOSIS — E119 Type 2 diabetes mellitus without complications: Secondary | ICD-10-CM | POA: Insufficient documentation

## 2015-08-02 DIAGNOSIS — G43909 Migraine, unspecified, not intractable, without status migrainosus: Secondary | ICD-10-CM | POA: Insufficient documentation

## 2015-08-02 DIAGNOSIS — J849 Interstitial pulmonary disease, unspecified: Secondary | ICD-10-CM | POA: Insufficient documentation

## 2015-08-05 ENCOUNTER — Emergency Department
Admission: EM | Admit: 2015-08-05 | Discharge: 2015-08-05 | Disposition: A | Discharge status: Home / Self Care | Payer: Medicare Other | Attending: Emergency Medicine | Admitting: Emergency Medicine

## 2015-08-05 ENCOUNTER — Encounter: Payer: Self-pay | Admitting: Emergency Medicine

## 2015-08-05 ENCOUNTER — Ambulatory Visit: Payer: Self-pay | Admitting: Obstetrics and Gynecology

## 2015-08-05 ENCOUNTER — Emergency Department: Payer: Medicare Other

## 2015-08-05 DIAGNOSIS — J209 Acute bronchitis, unspecified: Secondary | ICD-10-CM | POA: Insufficient documentation

## 2015-08-05 DIAGNOSIS — R42 Dizziness and giddiness: Secondary | ICD-10-CM | POA: Diagnosis present

## 2015-08-05 DIAGNOSIS — R112 Nausea with vomiting, unspecified: Secondary | ICD-10-CM | POA: Insufficient documentation

## 2015-08-05 DIAGNOSIS — R1012 Left upper quadrant pain: Secondary | ICD-10-CM | POA: Diagnosis not present

## 2015-08-05 DIAGNOSIS — Z7982 Long term (current) use of aspirin: Secondary | ICD-10-CM | POA: Insufficient documentation

## 2015-08-05 DIAGNOSIS — Z792 Long term (current) use of antibiotics: Secondary | ICD-10-CM | POA: Diagnosis not present

## 2015-08-05 DIAGNOSIS — Z880 Allergy status to penicillin: Secondary | ICD-10-CM | POA: Diagnosis not present

## 2015-08-05 DIAGNOSIS — I10 Essential (primary) hypertension: Secondary | ICD-10-CM | POA: Diagnosis not present

## 2015-08-05 DIAGNOSIS — J40 Bronchitis, not specified as acute or chronic: Secondary | ICD-10-CM

## 2015-08-05 DIAGNOSIS — E860 Dehydration: Secondary | ICD-10-CM

## 2015-08-05 DIAGNOSIS — Z79899 Other long term (current) drug therapy: Secondary | ICD-10-CM | POA: Insufficient documentation

## 2015-08-05 DIAGNOSIS — E119 Type 2 diabetes mellitus without complications: Secondary | ICD-10-CM | POA: Diagnosis not present

## 2015-08-05 DIAGNOSIS — Z7984 Long term (current) use of oral hypoglycemic drugs: Secondary | ICD-10-CM | POA: Diagnosis not present

## 2015-08-05 LAB — CBC WITH DIFFERENTIAL/PLATELET
Basophils Absolute: 0 10*3/uL (ref 0–0.1)
Basophils Relative: 1 %
EOS ABS: 0 10*3/uL (ref 0–0.7)
EOS PCT: 0 %
HCT: 35.1 % (ref 35.0–47.0)
HEMOGLOBIN: 11.7 g/dL — AB (ref 12.0–16.0)
LYMPHS ABS: 1 10*3/uL (ref 1.0–3.6)
Lymphocytes Relative: 10 %
MCH: 30.1 pg (ref 26.0–34.0)
MCHC: 33.4 g/dL (ref 32.0–36.0)
MCV: 90.3 fL (ref 80.0–100.0)
MONO ABS: 1 10*3/uL — AB (ref 0.2–0.9)
Monocytes Relative: 10 %
Neutro Abs: 7.8 10*3/uL — ABNORMAL HIGH (ref 1.4–6.5)
Neutrophils Relative %: 79 %
PLATELETS: 150 10*3/uL (ref 150–440)
RBC: 3.89 MIL/uL (ref 3.80–5.20)
RDW: 15.4 % — ABNORMAL HIGH (ref 11.5–14.5)
WBC: 9.8 10*3/uL (ref 3.6–11.0)

## 2015-08-05 LAB — COMPREHENSIVE METABOLIC PANEL
ALK PHOS: 49 U/L (ref 38–126)
ALT: 29 U/L (ref 14–54)
AST: 41 U/L (ref 15–41)
Albumin: 3.8 g/dL (ref 3.5–5.0)
Anion gap: 10 (ref 5–15)
BUN: 14 mg/dL (ref 6–20)
CO2: 25 mmol/L (ref 22–32)
Calcium: 8.9 mg/dL (ref 8.9–10.3)
Chloride: 95 mmol/L — ABNORMAL LOW (ref 101–111)
Creatinine, Ser: 0.5 mg/dL (ref 0.44–1.00)
GFR calc Af Amer: >60 mL/min (ref >60)
GFR calc non Af Amer: >60 mL/min (ref >60)
Glucose, Bld: 171 mg/dL — ABNORMAL HIGH (ref 65–99)
Potassium: 3.9 mmol/L (ref 3.5–5.1)
Sodium: 130 mmol/L — ABNORMAL LOW (ref 135–145)
Total Bilirubin: 0.8 mg/dL (ref 0.3–1.2)
Total Protein: 8 g/dL (ref 6.5–8.1)

## 2015-08-05 LAB — URINALYSIS COMPLETE WITH MICROSCOPIC (ARMC ONLY)
BILIRUBIN URINE: NEGATIVE
Glucose, UA: 50 mg/dL — AB
HGB URINE DIPSTICK: NEGATIVE
LEUKOCYTES UA: NEGATIVE
Nitrite: NEGATIVE
PH: 7 (ref 5.0–8.0)
PROTEIN: 30 mg/dL — AB
Specific Gravity, Urine: 1.017 (ref 1.005–1.030)

## 2015-08-05 LAB — CK: Total CK: 86 U/L (ref 38–234)

## 2015-08-05 LAB — LIPASE, BLOOD: LIPASE: 39 U/L (ref 11–51)

## 2015-08-05 MED ORDER — ONDANSETRON HCL 4 MG/2ML IJ SOLN
4.0000 mg | Freq: Once | INTRAMUSCULAR | Status: AC
  Administered 2015-08-05: 4 mg via INTRAVENOUS
  Filled 2015-08-05: qty 2

## 2015-08-05 MED ORDER — ONDANSETRON 4 MG PO TBDP: 4.0000 mg | ORAL_TABLET | Freq: Four times a day (QID) | ORAL | Status: DC | PRN

## 2015-08-05 MED ORDER — SODIUM CHLORIDE 0.9 % IV BOLUS (SEPSIS)
1000.0000 mL | Freq: Once | INTRAVENOUS | Status: AC
  Administered 2015-08-05: 1000 mL via INTRAVENOUS

## 2015-08-05 NOTE — ED Provider Notes (Signed)
[facility] Emergency Department Provider Note  ____________________________________________  Time seen: Approximately 5:44 PM  I have reviewed the triage vital signs and the nursing notes.   HISTORY  Chief Complaint Nausea and Dizziness    HPI [name] is a 82 y.o. female presents for evaluation of feeling weak today. Daughter reports the patient has had 3 recent urinary tract infections. She been treated with Macrobid. In addition, patient has been rather fatigued over the last couple of weeks, she has been occasionally coughing. Patient states she is not having any pain or trouble breathing. She does report a cough for about one month. No fevers or chills, though the daughter did report her temperature was 99.1 at urgent care.  Patient seen in urgent care and sent home with Levaquin. This afternoon the patient became nauseated and vomited once. She reports that she presently feels okay. She does feel dehydrated though.   Past Medical History  Diagnosis Date  . Diabetes mellitus without complication ([place])   . Breast cancer ([place]) 2015    right- radiation  . Anxiety   . Cough   . Depression   . Gout   . HLD (hyperlipidemia)   . HTN (hypertension)   . Pulmonary nodule   . Interstitial lung disease ([place])   . Lumbar spinal stenosis   . Macular degeneration   . Migraines   . Osteoarthritis   . Phlebitis   . Rheumatoid arthritis [facility])     Patient Active Problem List   Diagnosis Date Noted  . Diabetes mellitus, type 2 ([place]) 08/02/2015  . ILD (interstitial lung disease) ([place]) 08/02/2015  . Headache, migraine 08/02/2015  . Inflammation of a vein 08/02/2015  . Anxiety and depression 10/15/2013  . Essential (primary) hypertension 10/15/2013  . Gout 10/15/2013  . Degeneration macular 10/15/2013  . Combined fat and carbohydrate induced hyperlipemia 10/15/2013  . Arthritis, degenerative 10/15/2013  . Arthritis or polyarthritis, rheumatoid ([place])  10/15/2013    Past Surgical History  Procedure Laterality Date  . Breast biopsy Right 02/21/2015    path pending  . Abdominal hysterectomy    . Colonoscopy    . Esophagogastroduodenoscopy    . Mastectomy, partial      Current Outpatient Rx  Name  Route  Sig  Dispense  Refill  . acetaminophen (TYLENOL) 500 MG tablet   Oral   Take 1,000 mg by mouth every 4 (four) hours as needed for mild pain or headache.          . Adalimumab (HUMIRA) 40 MG/0.8ML PSKT   Subcutaneous   Inject 40 mg into the skin every 21 ( twenty-one) days.         Marland Kitchen albuterol (PROVENTIL HFA;VENTOLIN HFA) 108 (90 Base) MCG/ACT inhaler   Inhalation   Inhale 2 puffs into the lungs every 6 (six) hours as needed for wheezing or shortness of breath.         . allopurinol (ZYLOPRIM) 300 MG tablet   Oral   Take 300 mg by mouth daily.         Marland Kitchen aspirin EC 325 MG tablet   Oral   Take 325 mg by mouth daily.          . carvedilol (COREG) 6.25 MG tablet   Oral   Take 6.25 mg by mouth 2 (two) times daily with a meal.         . folic acid (FOLVITE) A999333 MCG tablet   Oral   Take 400 mcg  by mouth daily.         Marland Kitchen levofloxacin (LEVAQUIN) 500 MG tablet   Oral   Take 500 mg by mouth daily.         Marland Kitchen losartan (COZAAR) 100 MG tablet   Oral   Take 100 mg by mouth daily.         Marland Kitchen lovastatin (MEVACOR) 40 MG tablet   Oral   Take 40 mg by mouth at bedtime.         . metFORMIN (GLUCOPHAGE) 500 MG tablet   Oral   Take 500 mg by mouth 2 (two) times daily with a meal.         . methotrexate (RHEUMATREX) 2.5 MG tablet   Oral   Take 15 mg by mouth once a week. Pt takes on Wednesday.   Caution:Chemotherapy. Protect from light.         . Multiple Vitamin (MULTIVITAMIN WITH MINERALS) TABS tablet   Oral   Take 1 tablet by mouth daily.         Marland Kitchen ofloxacin (OCUFLOX) 0.3 % ophthalmic solution   Both Eyes   Place 2 drops into both eyes 4 (four) times daily.         Marland Kitchen PARoxetine (PAXIL) 30 MG  tablet   Oral   Take 30 mg by mouth daily.          . ondansetron (ZOFRAN ODT) 4 MG disintegrating tablet   Oral   Take 1 tablet (4 mg total) by mouth every 6 (six) hours as needed for nausea or vomiting.   20 tablet   0     Allergies Lisinopril; Penicillins; Clarithromycin; and Sulfa antibiotics  Family History  Problem Relation Age of Onset  . Colon cancer Mother   . Stomach cancer Mother   . Esophageal cancer Mother   . COPD Father   . Colon polyps Sister   . Ovarian cancer Sister     Social History Social History  Substance Use Topics  . Smoking status: Never Smoker   . Smokeless tobacco: Never Used  . Alcohol Use: None    Review of Systems Constitutional: A very low-grade fever earlier today Eyes: No visual changes. ENT: No sore throat. Cardiovascular: Denies chest pain. Respiratory: Denies shortness of breath. Gastrointestinal: No abdominal pain.    No diarrhea.  No constipation. Genitourinary: Negative for dysuria. Musculoskeletal: Negative for back pain. Skin: Negative for rash. Neurological: Negative for headaches, focal weakness or numbness.  10-point ROS otherwise negative.  ____________________________________________   PHYSICAL EXAM:  VITAL SIGNS: ED Triage Vitals  Enc Vitals Group     BP --      Pulse --      Resp --      Temp --      Temp src --      SpO2 --      Weight --      Height --      Head Cir --      Peak Flow --      Pain Score --      Pain Loc --      Pain Edu? --      Excl. in [place]? --    Constitutional: Alert and oriented. Well appearing and in no acute distress. Eyes: Conjunctivae are normal. PERRL. EOMI. Head: Atraumatic. Nose: No congestion/rhinnorhea. Mouth/Throat: Mucous membranes are slightly dry.  Oropharynx non-erythematous. Neck: No stridor.   Cardiovascular: Normal rate, regular rhythm. Grossly normal heart sounds.  Good peripheral circulation. Respiratory: Normal respiratory effort.  No retractions.  Lungs CTAB. Gastrointestinal: Soft and nontender except for some minimal discomfort across the left upper quadrant without rebound or guarding. No distention.  No CVA tenderness. Musculoskeletal: No lower extremity tenderness nor edema.  No joint effusions. Neurologic:  Normal speech and language. No gross focal neurologic deficits are appreciated. No gait instability that she does require some assistance, the patient reports that she walks about the house generally having to hold onto furniture and this is not unusual for her. Skin:  Skin is warm, dry and intact. No rash noted. Psychiatric: Mood and affect are normal. Speech and behavior are normal.  ____________________________________________   LABS (all labs ordered are listed, but only abnormal results are displayed)  Labs Reviewed  CBC WITH DIFFERENTIAL/PLATELET - Abnormal; Notable for the following:    Hemoglobin 11.7 (*)    RDW 15.4 (*)    Neutro Abs 7.8 (*)    Monocytes Absolute 1.0 (*)    All other components within normal limits  COMPREHENSIVE METABOLIC PANEL - Abnormal; Notable for the following:    Sodium 130 (*)    Chloride 95 (*)    Glucose, Bld 171 (*)    All other components within normal limits  URINALYSIS COMPLETEWITH MICROSCOPIC ([facility] ONLY) - Abnormal; Notable for the following:    Color, Urine YELLOW (*)    APPearance CLEAR (*)    Glucose, UA 50 (*)    Ketones, ur 1+ (*)    Protein, ur 30 (*)    Bacteria, UA RARE (*)    Squamous Epithelial / LPF 0-5 (*)    All other components within normal limits  CK  LIPASE, BLOOD   ____________________________________________  EKG  Reviewed and interpreted by me and 1900 Rate 71 Normal sinus rhythm, occasional PVC, right bundle branch block with associated T-wave inversions anteroseptally. No evidence of acute ischemic abnormality. QTc 460 QRS 150 ____________________________________________  RADIOLOGY  DG Chest 2 View (Final result) Result time: 08/05/15  18:35:20   Final result by Rad Results In Interface (08/05/15 18:35:20)   Narrative:   CLINICAL DATA: Cough for 1 month. Nausea and vomiting. Dizziness.  EXAM: CHEST 2 VIEW  COMPARISON: Radiographs 05/08/2013, CT 07/10/2013  FINDINGS: Cardiomegaly is stable. Interstitial markings are prominent, patient with history of interstitial lung disease. No evidence of pulmonary edema, pleural effusion or consolidation to suggest pneumonia. There is degenerative change in the spine.  IMPRESSION: Stable cardiomegaly. Prominent interstitial markings, patient with history of interstitial lung disease.  No superimposed acute process.   Electronically Signed By: Jeb Levering M.D. On: 08/05/2015 18:35          DG Abd 2 Views (Final result) Result time: 08/05/15 18:37:14   Final result by Rad Results In Interface (08/05/15 18:37:14)   Narrative:   CLINICAL DATA: Vomiting for the last few hours. Nausea.  EXAM: ABDOMEN - 2 VIEW  COMPARISON: None.  FINDINGS: The bowel gas pattern is normal. There is no evidence of free air. Moderate stool burden in the transverse and descending colon. Small rounded densities in the left mid abdomen may be related to enteric contrast for left nephrolithiasis. Pelvic phleboliths are seen. Atherosclerotic calcifications noted of the aorta and splenic arteries. No acute osseous abnormalities.  IMPRESSION: 1. Normal bowel gas pattern. Moderate stool burden. 2. Small rounded densities in the left mid abdomen may be related to enteric contents or left nephrolithiasis.   Electronically Signed By: Jeb Levering M.D. On: 08/05/2015 18:37  ____________________________________________   PROCEDURES  Procedure(s) performed: None  Critical Care performed: No  ____________________________________________   INITIAL IMPRESSION / ASSESSMENT AND PLAN / ED COURSE  Pertinent labs & imaging results that were  available during my care of the patient were reviewed by me and considered in my medical decision making (see chart for details).  Patient presents for weakness and cough. Cough ongoing for about a month, weakness or last couple of weeks. She does appear slightly dehydrated, and is just slightly fatigued. No evidence of acute distress. No significant leukocytosis. She is awake and alert and well-oriented. Labs including x-ray and urinalysis do not demonstrate acute bacterial process. She has reassuring exam. After receiving a liter of fluid the patient reports she feels well. She is in no distress, discussed with the patient and her family and she will continue on her antibiotic which is Levaquin and I will give her prescription for Zofran. Careful return precautions advised. No evidence of acute sepsis, neurologic, or cardiac event.  Return precautions and treatment recommendations and follow-up discussed with the patient who is agreeable with the plan.  ____________________________________________   FINAL CLINICAL IMPRESSION(S) / ED DIAGNOSES  Final diagnoses:  Dehydration  Bronchitis      Delman Kitten, MD 08/06/15 [phone]

## 2015-08-05 NOTE — Discharge Instructions (Signed)
Please continue the antibiotics your prescribed today. Follow up closely with your doctor. Return to the emergency room if you have developed trouble breathing, high fever, worsening weakness, vomiting, or other new concerns arise  Dehydration, Adult Dehydration is a condition in which you do not have enough fluid or water in your body. It happens when you take in less fluid than you lose. Vital organs such as the kidneys, brain, and heart cannot function without a proper amount of fluids. Any loss of fluids from the body can cause dehydration.  Dehydration can range from mild to severe. This condition should be treated right away to help prevent it from becoming severe. CAUSES  This condition may be caused by:  Vomiting.  Diarrhea.  Excessive sweating, such as when exercising in hot or humid weather.  Not drinking enough fluid during strenuous exercise or during an illness.  Excessive urine output.  Fever.  Certain medicines. RISK FACTORS This condition is more likely to develop in:  People who are taking certain medicines that cause the body to lose excess fluid (diuretics).   People who have a chronic illness, such as diabetes, that may increase urination.  Older adults.   People who live at high altitudes.   People who participate in endurance sports.  SYMPTOMS  Mild Dehydration  Thirst.  Dry lips.  Slightly dry mouth.  Dry, warm skin. Moderate Dehydration  Very dry mouth.   Muscle cramps.   Dark urine and decreased urine production.   Decreased tear production.   Headache.   Light-headedness, especially when you stand up from a sitting position.  Severe Dehydration  Changes in skin.   Cold and clammy skin.   Skin does not spring back quickly when lightly pinched and released.   Changes in body fluids.   Extreme thirst.   No tears.   Not able to sweat when body temperature is high, such as in hot weather.   Minimal urine  production.   Changes in vital signs.   Rapid, weak pulse (more than 100 beats per minute when you are sitting still).   Rapid breathing.   Low blood pressure.   Other changes.   Sunken eyes.   Cold hands and feet.   Confusion.  Lethargy and difficulty being awakened.  Fainting (syncope).   Short-term weight loss.   Unconsciousness. DIAGNOSIS  This condition may be diagnosed based on your symptoms. You may also have tests to determine how severe your dehydration is. These tests may include:   Urine tests.   Blood tests.  TREATMENT  Treatment for this condition depends on the severity. Mild or moderate dehydration can often be treated at home. Treatment should be started right away. Do not wait until dehydration becomes severe. Severe dehydration needs to be treated at the hospital. Treatment for Mild Dehydration  Drinking plenty of water to replace the fluid you have lost.   Replacing minerals in your blood (electrolytes) that you may have lost.  Treatment for Moderate Dehydration  Consuming oral rehydration solution (ORS). Treatment for Severe Dehydration  Receiving fluid through an IV tube.   Receiving electrolyte solution through a feeding tube that is passed through your nose and into your stomach (nasogastric tube or NG tube).  Correcting any abnormalities in electrolytes. HOME CARE INSTRUCTIONS   Drink enough fluid to keep your urine clear or pale yellow.   Drink water or fluid slowly by taking small sips. You can also try sucking on ice cubes.  Have food or  beverages that contain electrolytes. Examples include bananas and sports drinks.  Take over-the-counter and prescription medicines only as told by your health care provider.   Prepare ORS according to the manufacturer's instructions. Take sips of ORS every 5 minutes until your urine returns to normal.  If you have vomiting or diarrhea, continue to try to drink water, ORS, or  both.   If you have diarrhea, avoid:   Beverages that contain caffeine.   Fruit juice.   Milk.   Carbonated soft drinks.  Do not take salt tablets. This can lead to the condition of having too much sodium in your body (hypernatremia).  SEEK MEDICAL CARE IF:  You cannot eat or drink without vomiting.  You have had moderate diarrhea during a period of more than 24 hours.  You have a fever. SEEK IMMEDIATE MEDICAL CARE IF:   You have extreme thirst.  You have severe diarrhea.  You have not urinated in 6-8 hours, or you have urinated only a small amount of very dark urine.  You have shriveled skin.  You are dizzy, confused, or both.   This information is not intended to replace advice given to you by your health care provider. Make sure you discuss any questions you have with your health care provider.   Document Released: 06/18/2005 Document Revised: 03/09/2015 Document Reviewed: 11/03/2014 Elsevier Interactive Patient Education [employer].

## 2015-08-05 NOTE — ED Notes (Signed)
Pt arrived by EMS with complaints of n/v and dizziness. Per EMS pt was seen at [facility] walk-in clinic today and dx with pneumonia and conjunctivitis today. Pt was prescribed levofloxacin and ofloxacin.

## 2015-08-08 ENCOUNTER — Ambulatory Visit (INDEPENDENT_AMBULATORY_CARE_PROVIDER_SITE_OTHER): Payer: Medicare Other | Admitting: Obstetrics and Gynecology

## 2015-08-08 ENCOUNTER — Encounter: Payer: Self-pay | Admitting: Obstetrics and Gynecology

## 2015-08-08 VITALS — BP 160/83 | HR 68 | Resp 16 | Ht 62.0 in | Wt 150.6 lb

## 2015-08-08 DIAGNOSIS — N390 Urinary tract infection, site not specified: Secondary | ICD-10-CM | POA: Diagnosis not present

## 2015-08-08 DIAGNOSIS — N952 Postmenopausal atrophic vaginitis: Secondary | ICD-10-CM

## 2015-08-08 LAB — URINALYSIS, COMPLETE
Bilirubin, UA: NEGATIVE
Glucose, UA: NEGATIVE
Ketones, UA: NEGATIVE
Leukocytes, UA: NEGATIVE
Nitrite, UA: NEGATIVE
PH UA: 5.5 (ref 5.0–7.5)
PROTEIN UA: NEGATIVE
RBC, UA: NEGATIVE
Specific Gravity, UA: 1.015 (ref 1.005–1.030)
UUROB: 2 mg/dL — AB (ref 0.2–1.0)

## 2015-08-08 LAB — MICROSCOPIC EXAMINATION: WBC UA: NONE SEEN /HPF (ref 0–?)

## 2015-08-08 LAB — BLADDER SCAN AMB NON-IMAGING

## 2015-08-08 NOTE — Progress Notes (Signed)
08/08/2015 10:42 AM   [name] December 28, 1931 [id number]  Referring provider: Dion Body, MD [facility] [place], [place] [address]  Chief Complaint  Patient presents with  . Recurrent UTI  . Establish Care    HPI: Patient is an 82 year old female presenting today with her daughter as a referral for recurrent urinary tract infections. Intermittent urinary symptoms include dysuria, nocturia, incontinence, difficulty starting urinary stream. She denies any gross hematuria or flank pain. No fevers.  07/25/15 Urine Culture + > 100,000 E.Coli and eight documented positive E. Coli  UTIs over the last year.  Seen in ED on 08/05/15 most recently treated with Levaquin for suspected pneumonia though when she was seen in ED and chest xray demonstrated no acute process. An abdominal x-ray was also performed noting a moderate stool burden as well as some small rounded densities left mid abdomen attributed to possible enteric contents or left nephrolithiasis.  She is not currently taking any antibiotics.  She reports that she drinks little if any water. She does drink quite a few sodas per day  S/p hysterectomy and bladder tack. No history of kidney stones  PMH: Past Medical History  Diagnosis Date  . Diabetes mellitus without complication ([place])   . Breast cancer ([place]) 2015    right- radiation  . Anxiety   . Cough   . Depression   . Gout   . HLD (hyperlipidemia)   . HTN (hypertension)   . Pulmonary nodule   . Interstitial lung disease ([place])   . Lumbar spinal stenosis   . Macular degeneration   . Migraines   . Osteoarthritis   . Phlebitis   . Rheumatoid arthritis [facility])     Surgical History: Past Surgical History  Procedure Laterality Date  . Breast biopsy Right 02/21/2015    path pending  . Abdominal hysterectomy    . Colonoscopy    . Esophagogastroduodenoscopy    . Mastectomy, partial      Home Medications:    Medication List        This list is accurate as of: 08/08/15 10:42 AM.  Always use your most recent med list.               acetaminophen 500 MG tablet  Commonly known as:  TYLENOL  Take 1,000 mg by mouth every 4 (four) hours as needed for mild pain or headache. Reported on 08/08/2015     albuterol 108 (90 Base) MCG/ACT inhaler  Commonly known as:  PROVENTIL HFA;VENTOLIN HFA  Inhale 2 puffs into the lungs every 6 (six) hours as needed for wheezing or shortness of breath. Reported on 08/08/2015     allopurinol 300 MG tablet  Commonly known as:  ZYLOPRIM  Take 300 mg by mouth daily.     aspirin EC 325 MG tablet  Take 325 mg by mouth daily.     benzonatate 200 MG capsule  Commonly known as:  TESSALON  Take by mouth. Reported on 08/08/2015     carvedilol 6.25 MG tablet  Commonly known as:  COREG  Take 6.25 mg by mouth 2 (two) times daily with a meal.     folic acid A999333 MCG tablet  Commonly known as:  FOLVITE  Take 400 mcg by mouth daily.     furosemide 20 MG tablet  Commonly known as:  LASIX  1 tablet per day for 3 days     HUMIRA 40 MG/0.8ML Pskt  Generic drug:  Adalimumab  Inject  40 mg into the skin every 21 ( twenty-one) days.     levofloxacin 500 MG tablet  Commonly known as:  LEVAQUIN  Take 500 mg by mouth daily. Reported on 08/08/2015     losartan 100 MG tablet  Commonly known as:  COZAAR  Take 100 mg by mouth daily.     lovastatin 40 MG tablet  Commonly known as:  MEVACOR  Take 40 mg by mouth at bedtime.     metFORMIN 500 MG tablet  Commonly known as:  GLUCOPHAGE  Take 500 mg by mouth 2 (two) times daily with a meal.     methotrexate 2.5 MG tablet  Commonly known as:  RHEUMATREX  Take 15 mg by mouth once a week. Pt takes on Wednesday.   Caution:Chemotherapy. Protect from light.     multivitamin with minerals Tabs tablet  Take 1 tablet by mouth daily.     ofloxacin 0.3 % ophthalmic solution  Commonly known as:  OCUFLOX  Place 2 drops into both eyes 4 (four) times daily.      ondansetron 4 MG disintegrating tablet  Commonly known as:  ZOFRAN ODT  Take 1 tablet (4 mg total) by mouth every 6 (six) hours as needed for nausea or vomiting.     PARoxetine 30 MG tablet  Commonly known as:  PAXIL  Take 30 mg by mouth daily.        Allergies:  Allergies  Allergen Reactions  . Lisinopril Cough  . Penicillins Other (See Comments)    Reaction:  Unknown   . Clarithromycin Rash  . Sulfa Antibiotics Rash    Family History: Family History  Problem Relation Age of Onset  . Colon cancer Mother   . Stomach cancer Mother   . Esophageal cancer Mother   . COPD Father   . Colon polyps Sister   . Ovarian cancer Sister     Social History:  reports that she has never smoked. She has never used smokeless tobacco. Her alcohol and drug histories are not on file.  ROS: UROLOGY Frequent Urination?: No Hard to postpone urination?: No Burning/pain with urination?: Yes Get up at night to urinate?: Yes Leakage of urine?: Yes Urine stream starts and stops?: No Trouble starting stream?: Yes Do you have to strain to urinate?: No Blood in urine?: No Urinary tract infection?: Yes Sexually transmitted disease?: No Injury to kidneys or bladder?: No Painful intercourse?: No Weak stream?: No Currently pregnant?: No Vaginal bleeding?: No Last menstrual period?: n/a  Gastrointestinal Nausea?: Yes Vomiting?: Yes Indigestion/heartburn?: No Diarrhea?: No Constipation?: No  Constitutional Fever: Yes Night sweats?: No Weight loss?: No Fatigue?: No  Skin Skin rash/lesions?: No Itching?: No  Eyes Blurred vision?: No Double vision?: No  Ears/Nose/Throat Sore throat?: Yes Sinus problems?: No  Hematologic/Lymphatic Swollen glands?: No Easy bruising?: No  Cardiovascular Leg swelling?: No Chest pain?: No  Respiratory Cough?: Yes Shortness of breath?: No  Endocrine Excessive thirst?: No  Musculoskeletal Back pain?: No Joint pain?:  Yes  Neurological Headaches?: No Dizziness?: No  Psychologic Depression?: No Anxiety?: No  Physical Exam: BP 160/83 mmHg  Pulse 68  Resp 16  Ht 5\' 2"  (1.575 m)  Wt 150 lb 9.6 oz (68.312 kg)  BMI 27.54 kg/m2  Constitutional:  Alert and oriented, No acute distress. HEENT: [place] AT, moist mucus membranes.  Trachea midline, no masses. Cardiovascular: No clubbing, cyanosis, or edema. Respiratory: Normal respiratory effort, no increased work of breathing. GI: Abdomen is soft, generalized mild abdominal tenderness, nondistended, no  abdominal masses GU: No CVA tenderness.  Pelvic Exam: Atrophic vaginal mucosa, patent urinary meatus, no SUI with Valsalva, grade 1 cystocele, grade 1-2 rectocele, no rashes or lesions Skin: No rashes, bruises or suspicious lesions. Lymph: No cervical or inguinal adenopathy. Neurologic: Grossly intact, no focal deficits, moving all 4 extremities. Psychiatric: Normal mood and affect.  Laboratory Data:  Lab Results  Component Value Date   WBC 9.8 08/05/2015   HGB 11.7* 08/05/2015   HCT 35.1 08/05/2015   MCV 90.3 08/05/2015   PLT 150 08/05/2015    Lab Results  Component Value Date   CREATININE 0.50 08/05/2015    No results found for: PSA  No results found for: TESTOSTERONE  Lab Results  Component Value Date   HGBA1C 7.4* 09/10/2012    Urinalysis Results for orders placed or performed in visit on 08/08/15  BLADDER SCAN AMB NON-IMAGING  Result Value Ref Range   Scan Result 139 mL      Pertinent Imaging: CLINICAL DATA: Vomiting for the last few hours. Nausea.  EXAM: ABDOMEN - 2 VIEW  COMPARISON: None.  FINDINGS: The bowel gas pattern is normal. There is no evidence of free air. Moderate stool burden in the transverse and descending colon. Small rounded densities in the left mid abdomen may be related to enteric contrast for left nephrolithiasis. Pelvic phleboliths are seen. Atherosclerotic calcifications noted of the aorta  and splenic arteries. No acute osseous abnormalities.  IMPRESSION: 1. Normal bowel gas pattern. Moderate stool burden. 2. Small rounded densities in the left mid abdomen may be related to enteric contents or left nephrolithiasis.   Electronically Signed  By: Jeb Levering M.D.  On: 08/05/2015 18:37   Assessment & Plan:    1. Recurrent UTI-  UA unremarkable today. PVR slightly elevated at 160mL.  Recommend double voiding in the morning and at night. UTI prevention strategies discussed.  Good perineal hygiene reviewed. Patient is encouraged to increase daily water intake, start cranberry supplements to prevent invasive colonization along the urinary tract and probiotics, especially lactobacillus to restore normal vaginal flora. Patient also encouraged to double void in the morning and prior to bed.  - Urinalysis, Complete - BLADDER SCAN AMB NON-IMAGING  2. Vaginal Atrophy-  Not a candidate for vaginal estrogen d/t h/o breast cancer. Recommended vaginal moisturizers such as coconut oil.  Return in about 6 weeks (around 09/19/2015) for recheck UTI.  These notes generated with voice recognition software. I apologize for typographical errors.  [name], [facility] [address], [place], [place] [address] [phone]

## 2015-08-08 NOTE — Patient Instructions (Addendum)
                                             Urinary Tract Infection Prevention Patient Education Stay Hydrated: Urinary tract infections (UTIs) are less likely to occur in someone who is drinking enough water to promote regular urination, so it is very important to stay hydrated in order to help flush out bacteria from the urinary tract. Respond to "Nature's Call": It is always a good idea to urinate as soon as you feel the need. While "holding it in" does not directly cause an infection, it can cause overdistension that can damage the lining of the bladder, making it more vulnerable to bacteria. Remove Tampons Before Going: Remember to always take out tampons before urinating, and change tampons often.  Practice Proper Bathroom Hygiene: To keep bacteria near the urethral opening to a minimum, it is important to practice proper wiping techniques (i.e. front to back wiping) to help prevent rectal bacteria from entering the uretro-genital area. It can also be helpful to take showers and avoid soaking in the bathtub.  Take a Vitamin C Supplement: About 1,000 milligrams of vitamin C taken daily can help inhibit the growth of some bacteria by acidifying the urine. Maintain Control with Cranberries: Cranberries contain hippuronic acid, which is a natural antiseptic that may help prevent the adherence of bacteria to the bladder lining. Drinking 100% pure cranberry juice or taking over the counter cranberry supplements twice daily may help to prevent an infection. However, it is important to note that cranberry juices/supplements are not helpful once a urinary tract infection (UTI) is present. Strengthen Your Core: Often, a lazy bladder (unable to empty urine properly) occurs due to lower back problem, so consider doing exercises to help strengthen your back, pelvic floor, and stomach muscles.  Pay Attention to Your Urine: Your urine can change color for a variety of reasons, including from the medications you  take, so pay close attention to it to monitor your overall health. One key thing to note is that if your urine is typically a darker yellow, your body is dehydrated, so you need to step up your water intake.   You are no completely emptying you bladder.  Try and double void in the morning and at night.  Try to empty your bladder, wait 5-10 minutes and try again.

## 2015-09-19 ENCOUNTER — Ambulatory Visit: Payer: Medicare Other | Admitting: Urology

## 2015-10-04 ENCOUNTER — Encounter: Payer: Self-pay | Admitting: Urology

## 2015-10-04 ENCOUNTER — Ambulatory Visit (INDEPENDENT_AMBULATORY_CARE_PROVIDER_SITE_OTHER): Payer: Medicare Other | Admitting: Urology

## 2015-10-04 VITALS — BP 174/94 | HR 71 | Ht 62.0 in | Wt 152.5 lb

## 2015-10-04 DIAGNOSIS — N390 Urinary tract infection, site not specified: Secondary | ICD-10-CM

## 2015-10-04 DIAGNOSIS — N952 Postmenopausal atrophic vaginitis: Secondary | ICD-10-CM

## 2015-10-04 LAB — URINALYSIS, COMPLETE
BILIRUBIN UA: NEGATIVE
Glucose, UA: NEGATIVE
Ketones, UA: NEGATIVE
LEUKOCYTES UA: NEGATIVE
Nitrite, UA: NEGATIVE
PH UA: 6.5 (ref 5.0–7.5)
Protein, UA: NEGATIVE
RBC UA: NEGATIVE
Specific Gravity, UA: 1.015 (ref 1.005–1.030)
Urobilinogen, Ur: 0.2 mg/dL (ref 0.2–1.0)

## 2015-10-04 LAB — MICROSCOPIC EXAMINATION
Bacteria, UA: NONE SEEN
WBC, UA: NONE SEEN /hpf (ref 0–?)

## 2015-10-04 NOTE — Progress Notes (Signed)
10:46 AM   Mathis Fare September 28, 1931 [id number]  Referring provider: Dion Body, MD [facility] [place], [place] [address]  Chief Complaint  Patient presents with  . Recurrent UTI    6 week follow up    HPI: Patient is an 82 year old Caucasian female with a history of recurrent urinary tract infections who presents today for 6 week follow-up for recheck of a UTI.  Background story -07/25/15 Urine Culture + > 100,000 E.Coli and eight documented positive E. Coli  UTI's over the last year. -Seen in ED on 08/05/15 most recently treated with Levaquin for suspected pneumonia though when she was seen in ED and chest xray demonstrated no acute process. An abdominal x-ray was also performed noting a moderate stool burden as well as some small rounded densities left mid abdomen attributed to possible enteric contents or left nephrolithiasis.    She reports that she drinks little if any water. She does drink quite a few sodas per day  S/p hysterectomy and bladder tack. No history of kidney stones.    Patient is cathed for an UA specimen today.  Her UA is unremarkable.  She is not having symptoms of an UTI at today's visit.    PMH: Past Medical History  Diagnosis Date  . Diabetes mellitus without complication ([place])   . Breast cancer ([place]) 2015    right- radiation  . Anxiety   . Cough   . Depression   . Gout   . HLD (hyperlipidemia)   . HTN (hypertension)   . Pulmonary nodule   . Interstitial lung disease ([place])   . Lumbar spinal stenosis   . Macular degeneration   . Migraines   . Osteoarthritis   . Phlebitis   . Rheumatoid arthritis [facility])     Surgical History: Past Surgical History  Procedure Laterality Date  . Breast biopsy Right 02/21/2015    path pending  . Abdominal hysterectomy    . Colonoscopy    . Esophagogastroduodenoscopy    . Mastectomy, partial      Home Medications:    Medication List       This list is accurate as of:  10/04/15 10:46 AM.  Always use your most recent med list.               acetaminophen 500 MG tablet  Commonly known as:  TYLENOL  Take 1,000 mg by mouth every 4 (four) hours as needed for mild pain or headache. Reported on 08/08/2015     albuterol 108 (90 Base) MCG/ACT inhaler  Commonly known as:  PROVENTIL HFA;VENTOLIN HFA  Inhale 2 puffs into the lungs every 6 (six) hours as needed for wheezing or shortness of breath. Reported on 10/04/2015     allopurinol 300 MG tablet  Commonly known as:  ZYLOPRIM  Take 300 mg by mouth daily.     aspirin EC 325 MG tablet  Take 325 mg by mouth daily.     benzonatate 200 MG capsule  Commonly known as:  TESSALON  Take by mouth. Reported on 10/04/2015     carvedilol 6.25 MG tablet  Commonly known as:  COREG  Take 6.25 mg by mouth 2 (two) times daily with a meal.     folic acid A999333 MCG tablet  Commonly known as:  FOLVITE  Take 400 mcg by mouth daily.     furosemide 20 MG tablet  Commonly known as:  LASIX  Reported on 10/04/2015     HUMIRA  40 MG/0.8ML Pskt  Generic drug:  Adalimumab  Inject 40 mg into the skin every 21 ( twenty-one) days.     losartan 100 MG tablet  Commonly known as:  COZAAR  Take 100 mg by mouth daily.     lovastatin 40 MG tablet  Commonly known as:  MEVACOR  Take 40 mg by mouth at bedtime.     metFORMIN 500 MG tablet  Commonly known as:  GLUCOPHAGE  Take 500 mg by mouth 2 (two) times daily with a meal.     methotrexate 2.5 MG tablet  Commonly known as:  RHEUMATREX  Take 15 mg by mouth once a week. Pt takes on Wednesday.   Caution:Chemotherapy. Protect from light.     multivitamin with minerals Tabs tablet  Take 1 tablet by mouth daily.     ondansetron 4 MG disintegrating tablet  Commonly known as:  ZOFRAN ODT  Take 1 tablet (4 mg total) by mouth every 6 (six) hours as needed for nausea or vomiting.     PARoxetine 30 MG tablet  Commonly known as:  PAXIL  Take 30 mg by mouth daily.        Allergies:   Allergies  Allergen Reactions  . Lisinopril Cough  . Penicillins Other (See Comments)    Reaction:  Unknown   . Clarithromycin Rash  . Sulfa Antibiotics Rash    Family History: Family History  Problem Relation Age of Onset  . Colon cancer Mother   . Stomach cancer Mother   . Esophageal cancer Mother   . COPD Father   . Colon polyps Sister   . Ovarian cancer Sister   . Kidney disease Neg Hx   . Bladder Cancer Neg Hx     Social History:  reports that she has never smoked. She has never used smokeless tobacco. She reports that she does not drink alcohol or use illicit drugs.  ROS: UROLOGY Frequent Urination?: Yes Hard to postpone urination?: Yes Burning/pain with urination?: No Get up at night to urinate?: Yes Leakage of urine?: Yes Urine stream starts and stops?: No Trouble starting stream?: No Do you have to strain to urinate?: No Blood in urine?: No Urinary tract infection?: No Sexually transmitted disease?: No Injury to kidneys or bladder?: No Painful intercourse?: No Weak stream?: No Currently pregnant?: No Vaginal bleeding?: No Last menstrual period?: N  Gastrointestinal Nausea?: No Vomiting?: No Indigestion/heartburn?: No Diarrhea?: No Constipation?: No  Constitutional Fever: No Night sweats?: No Weight loss?: No Fatigue?: No  Skin Skin rash/lesions?: No Itching?: No  Eyes Blurred vision?: No Double vision?: No  Ears/Nose/Throat Sore throat?: No Sinus problems?: No  Hematologic/Lymphatic Swollen glands?: No Easy bruising?: No  Cardiovascular Leg swelling?: No Chest pain?: No  Respiratory Cough?: No Shortness of breath?: No  Endocrine Excessive thirst?: No  Musculoskeletal Back pain?: No Joint pain?: No  Neurological Headaches?: No Dizziness?: No  Psychologic Depression?: No Anxiety?: No  Physical Exam: BP 174/94 mmHg  Pulse 71  Ht 5\' 2"  (1.575 m)  Wt 152 lb 8 oz (69.174 kg)  BMI 27.89 kg/m2  Constitutional:   Alert and oriented, No acute distress. HEENT: [place] AT, moist mucus membranes.  Trachea midline, no masses. Cardiovascular: No clubbing, cyanosis, or edema. Respiratory: Normal respiratory effort, no increased work of breathing. Skin: No rashes, bruises or suspicious lesions. Lymph: No cervical or inguinal adenopathy. Neurologic: Grossly intact, no focal deficits, moving all 4 extremities. Psychiatric: Normal mood and affect.  Laboratory Data:  Lab Results  Component  Value Date   WBC 9.8 08/05/2015   HGB 11.7* 08/05/2015   HCT 35.1 08/05/2015   MCV 90.3 08/05/2015   PLT 150 08/05/2015    Lab Results  Component Value Date   CREATININE 0.50 08/05/2015     Lab Results  Component Value Date   HGBA1C 7.4* 09/10/2012    Urinalysis Results for orders placed or performed in visit on 10/04/15  Microscopic Examination  Result Value Ref Range   WBC, UA None seen 0 -  5 /hpf   RBC, UA 0-2 0 -  2 /hpf   Epithelial Cells (non renal) 0-10 0 - 10 /hpf   Bacteria, UA None seen None seen/Few  Urinalysis, Complete  Result Value Ref Range   Specific Gravity, UA 1.015 1.005 - 1.030   pH, UA 6.5 5.0 - 7.5   Color, UA Yellow Yellow   Appearance Ur Clear Clear   Leukocytes, UA Negative Negative   Protein, UA Negative Negative/Trace   Glucose, UA Negative Negative   Ketones, UA Negative Negative   RBC, UA Negative Negative   Bilirubin, UA Negative Negative   Urobilinogen, Ur 0.2 0.2 - 1.0 mg/dL   Nitrite, UA Negative Negative   Microscopic Examination See below:      Pertinent Imaging: CLINICAL DATA: Vomiting for the last few hours. Nausea.  EXAM: ABDOMEN - 2 VIEW  COMPARISON: None.  FINDINGS: The bowel gas pattern is normal. There is no evidence of free air. Moderate stool burden in the transverse and descending colon. Small rounded densities in the left mid abdomen may be related to enteric contrast for left nephrolithiasis. Pelvic phleboliths are  seen. Atherosclerotic calcifications noted of the aorta and splenic arteries. No acute osseous abnormalities.  IMPRESSION: 1. Normal bowel gas pattern. Moderate stool burden. 2. Small rounded densities in the left mid abdomen may be related to enteric contents or left nephrolithiasis.   Electronically Signed  By: Jeb Levering M.D.  On: 08/05/2015 18:37   Assessment & Plan:    1. Recurrent UTI-  UA unremarkable today. Continue double voiding in the morning and at night. UTI prevention strategies reinforced.  Good perineal hygiene being done. Patient is increasing daily water intake, taking cranberry supplements to prevent invasive colonization along the urinary tract and probiotics, especially lactobacillus to restore normal vaginal flora. Patient also encouraged to double void in the morning and prior to bed.   - Urinalysis, Complete  2. Vaginal Atrophy-  Not a candidate for vaginal estrogen d/t h/o breast cancer. Patient using coconut oil as a vaginal moisturizer.  Return in about 1 year (around 10/03/2016) for UA and PVR.  These notes generated with voice recognition software. I apologize for typographical errors.  Zara Council, [facility] [address], [place], [place] [address] [phone]

## 2015-10-04 NOTE — Progress Notes (Signed)
In and Out Catheterization  Patient is present today for a I & O catheterization due to recurrent uti. Patient was cleaned and prepped in a sterile fashion with betadine and Lidocaine 2% jelly was instilled into the urethra.  A 14FR cath was inserted no complications were noted , 115ml of urine return was noted, urine was clear and yellow in color. A clean urine sample was collected for Urinalysis. Bladder was drained and catheter was removed with out difficulty.    Preformed by: Lyndee Hensen CMA

## 2015-10-06 DIAGNOSIS — N390 Urinary tract infection, site not specified: Secondary | ICD-10-CM | POA: Insufficient documentation

## 2015-10-06 DIAGNOSIS — N952 Postmenopausal atrophic vaginitis: Secondary | ICD-10-CM | POA: Insufficient documentation

## 2015-11-10 ENCOUNTER — Other Ambulatory Visit: Payer: Self-pay | Admitting: Rheumatology

## 2015-11-10 DIAGNOSIS — M542 Cervicalgia: Secondary | ICD-10-CM

## 2015-11-10 DIAGNOSIS — M79603 Pain in arm, unspecified: Secondary | ICD-10-CM

## 2015-11-23 ENCOUNTER — Telehealth: Payer: Self-pay

## 2015-11-23 NOTE — Telephone Encounter (Signed)
Pt daughter called stating Friday afternoon she tried to call the office but it was after 5, so she took pt to urgent care in [place]. Daughter stated that while in office the physician told them she did not have an infection but called yesterday stating the ucx came back positive and started her on clindamycin. Daughter wanted to know if it was safe for pt to take medication. Requested daughter have ucx results faxed to our office. Daughter voiced understanding.

## 2015-11-30 ENCOUNTER — Ambulatory Visit
Admission: RE | Admit: 2015-11-30 | Discharge: 2015-11-30 | Disposition: A | Discharge status: Home / Self Care | Payer: Medicare Other | Source: Ambulatory Visit | Attending: Rheumatology | Admitting: Rheumatology

## 2015-11-30 DIAGNOSIS — M4802 Spinal stenosis, cervical region: Secondary | ICD-10-CM | POA: Insufficient documentation

## 2015-11-30 DIAGNOSIS — M79603 Pain in arm, unspecified: Secondary | ICD-10-CM | POA: Insufficient documentation

## 2015-11-30 DIAGNOSIS — M5030 Other cervical disc degeneration, unspecified cervical region: Secondary | ICD-10-CM | POA: Insufficient documentation

## 2015-11-30 DIAGNOSIS — M4312 Spondylolisthesis, cervical region: Secondary | ICD-10-CM | POA: Diagnosis not present

## 2015-11-30 DIAGNOSIS — M542 Cervicalgia: Secondary | ICD-10-CM | POA: Diagnosis present

## 2015-12-18 NOTE — Telephone Encounter (Signed)
I never received the urine culture results from the urgent care and [place]. Is patient having symptoms of a UTI at this time?

## 2015-12-19 NOTE — Telephone Encounter (Signed)
Spoke with pt daughter, Warren Lacy, in reference to records from [facility]. Amy stated that pt is doing well at this time and is no longer worried. Amy stated she will get records for next office visit at [facility].

## 2016-01-07 ENCOUNTER — Emergency Department: Payer: Medicare Other

## 2016-01-07 ENCOUNTER — Encounter: Payer: Self-pay | Admitting: Emergency Medicine

## 2016-01-07 ENCOUNTER — Emergency Department
Admission: EM | Admit: 2016-01-07 | Discharge: 2016-01-07 | Disposition: A | Discharge status: Home / Self Care | Payer: Medicare Other | Attending: Emergency Medicine | Admitting: Emergency Medicine

## 2016-01-07 DIAGNOSIS — Z7984 Long term (current) use of oral hypoglycemic drugs: Secondary | ICD-10-CM | POA: Insufficient documentation

## 2016-01-07 DIAGNOSIS — I10 Essential (primary) hypertension: Secondary | ICD-10-CM | POA: Diagnosis not present

## 2016-01-07 DIAGNOSIS — M25512 Pain in left shoulder: Secondary | ICD-10-CM | POA: Diagnosis not present

## 2016-01-07 DIAGNOSIS — Z7951 Long term (current) use of inhaled steroids: Secondary | ICD-10-CM | POA: Insufficient documentation

## 2016-01-07 DIAGNOSIS — F329 Major depressive disorder, single episode, unspecified: Secondary | ICD-10-CM | POA: Diagnosis not present

## 2016-01-07 DIAGNOSIS — E785 Hyperlipidemia, unspecified: Secondary | ICD-10-CM | POA: Diagnosis not present

## 2016-01-07 DIAGNOSIS — Z7982 Long term (current) use of aspirin: Secondary | ICD-10-CM | POA: Diagnosis not present

## 2016-01-07 DIAGNOSIS — Z853 Personal history of malignant neoplasm of breast: Secondary | ICD-10-CM | POA: Insufficient documentation

## 2016-01-07 DIAGNOSIS — E119 Type 2 diabetes mellitus without complications: Secondary | ICD-10-CM | POA: Diagnosis not present

## 2016-01-07 LAB — BASIC METABOLIC PANEL
ANION GAP: 10 (ref 5–15)
BUN: 21 mg/dL — ABNORMAL HIGH (ref 6–20)
CHLORIDE: 98 mmol/L — AB (ref 101–111)
CO2: 27 mmol/L (ref 22–32)
CREATININE: 0.76 mg/dL (ref 0.44–1.00)
Calcium: 10.1 mg/dL (ref 8.9–10.3)
GFR calc Af Amer: >60 mL/min (ref >60)
Glucose, Bld: 130 mg/dL — ABNORMAL HIGH (ref 65–99)
POTASSIUM: 4 mmol/L (ref 3.5–5.1)
SODIUM: 135 mmol/L (ref 135–145)

## 2016-01-07 LAB — CBC
HEMATOCRIT: 38.8 % (ref 35.0–47.0)
Hemoglobin: 13.2 g/dL (ref 12.0–16.0)
MCH: 31.8 pg (ref 26.0–34.0)
MCHC: 33.9 g/dL (ref 32.0–36.0)
MCV: 93.7 fL (ref 80.0–100.0)
PLATELETS: 183 10*3/uL (ref 150–440)
RBC: 4.14 MIL/uL (ref 3.80–5.20)
RDW: 16.7 % — AB (ref 11.5–14.5)
WBC: 7.2 10*3/uL (ref 3.6–11.0)

## 2016-01-07 LAB — TROPONIN I: Troponin I: <0.03 ng/mL (ref <0.03)

## 2016-01-07 MED ORDER — OXYCODONE-ACETAMINOPHEN 5-325 MG PO TABS
1.0000 | ORAL_TABLET | Freq: Once | ORAL | Status: AC
  Administered 2016-01-07: 1 via ORAL
  Filled 2016-01-07: qty 1

## 2016-01-07 NOTE — ED Provider Notes (Addendum)
[facility] Emergency Department Provider Note   ____________________________________________  Time seen: Approximately 7:20 PM  I have reviewed the triage vital signs and the nursing notes.   HISTORY  Chief Complaint Arm Pain   HPI [name] is a 82 y.o. female with a history of cervical radiculitis who is presenting to the emergency department with increased left shoulder and arm pain 3 days after having a C5-C6 steroid injection. The patient has family at the bedside and said that she was crying earlier in the day. She took one hydrocodone which she was prescribed after the procedure which she said did not help the pain. However, the patient says that the pain is significant only been reduced since earlier in the day. She denies any chest pain. Said that she was nauseous with pain but denies any nausea or vomiting at this time. Denies any chest pain. Says that she has had similar pain over the past several months and has had multiple injections now for pain control.   Past Medical History  Diagnosis Date  . Diabetes mellitus without complication ([place])   . Breast cancer ([place]) 2015    right- radiation  . Anxiety   . Cough   . Depression   . Gout   . HLD (hyperlipidemia)   . HTN (hypertension)   . Pulmonary nodule   . Interstitial lung disease ([place])   . Lumbar spinal stenosis   . Macular degeneration   . Migraines   . Osteoarthritis   . Phlebitis   . Rheumatoid arthritis [facility])     Patient Active Problem List   Diagnosis Date Noted  . Recurrent UTI 10/06/2015  . Vaginal atrophy 10/06/2015  . Diabetes mellitus, type 2 ([place]) 08/02/2015  . ILD (interstitial lung disease) ([place]) 08/02/2015  . Headache, migraine 08/02/2015  . Inflammation of a vein 08/02/2015  . Anxiety and depression 10/15/2013  . Essential (primary) hypertension 10/15/2013  . Gout 10/15/2013  . Degeneration macular 10/15/2013  . Combined fat and carbohydrate induced  hyperlipemia 10/15/2013  . Arthritis, degenerative 10/15/2013  . Arthritis or polyarthritis, rheumatoid ([place]) 10/15/2013    Past Surgical History  Procedure Laterality Date  . Breast biopsy Right 02/21/2015    path pending  . Abdominal hysterectomy    . Colonoscopy    . Esophagogastroduodenoscopy    . Mastectomy, partial      Current Outpatient Rx  Name  Route  Sig  Dispense  Refill  . acetaminophen (TYLENOL) 500 MG tablet   Oral   Take 1,000 mg by mouth every 4 (four) hours as needed for mild pain or headache. Reported on 08/08/2015         . Adalimumab (HUMIRA) 40 MG/0.8ML PSKT   Subcutaneous   Inject 40 mg into the skin every 21 ( twenty-one) days.         Marland Kitchen albuterol (PROVENTIL HFA;VENTOLIN HFA) 108 (90 Base) MCG/ACT inhaler   Inhalation   Inhale 2 puffs into the lungs every 6 (six) hours as needed for wheezing or shortness of breath. Reported on 10/04/2015         . allopurinol (ZYLOPRIM) 300 MG tablet   Oral   Take 300 mg by mouth daily.         Marland Kitchen aspirin EC 325 MG tablet   Oral   Take 325 mg by mouth daily.          . benzonatate (TESSALON) 200 MG capsule   Oral   Take by  mouth. Reported on 10/04/2015         . carvedilol (COREG) 6.25 MG tablet   Oral   Take 6.25 mg by mouth 2 (two) times daily with a meal.         . folic acid (FOLVITE) A999333 MCG tablet   Oral   Take 400 mcg by mouth daily.         . furosemide (LASIX) 20 MG tablet      Reported on 10/04/2015         . losartan (COZAAR) 100 MG tablet   Oral   Take 100 mg by mouth daily.         Marland Kitchen lovastatin (MEVACOR) 40 MG tablet   Oral   Take 40 mg by mouth at bedtime.         . metFORMIN (GLUCOPHAGE) 500 MG tablet   Oral   Take 500 mg by mouth 2 (two) times daily with a meal.         . methotrexate (RHEUMATREX) 2.5 MG tablet   Oral   Take 15 mg by mouth once a week. Pt takes on Wednesday.   Caution:Chemotherapy. Protect from light.         . Multiple Vitamin (MULTIVITAMIN  WITH MINERALS) TABS tablet   Oral   Take 1 tablet by mouth daily.         . ondansetron (ZOFRAN ODT) 4 MG disintegrating tablet   Oral   Take 1 tablet (4 mg total) by mouth every 6 (six) hours as needed for nausea or vomiting. Patient not taking: Reported on 08/08/2015   20 tablet   0   . PARoxetine (PAXIL) 30 MG tablet   Oral   Take 30 mg by mouth daily.            Allergies Lisinopril; Penicillins; Clarithromycin; and Sulfa antibiotics  Family History  Problem Relation Age of Onset  . Colon cancer Mother   . Stomach cancer Mother   . Esophageal cancer Mother   . COPD Father   . Colon polyps Sister   . Ovarian cancer Sister   . Kidney disease Neg Hx   . Bladder Cancer Neg Hx     Social History Social History  Substance Use Topics  . Smoking status: Never Smoker   . Smokeless tobacco: Never Used  . Alcohol Use: No    Review of Systems Constitutional: No fever/chills Eyes: No visual changes. ENT: No sore throat. Cardiovascular: Denies chest pain. Respiratory: Denies shortness of breath. Gastrointestinal: No abdominal pain.  No nausea, no vomiting.  No diarrhea.  No constipation. Genitourinary: Negative for dysuria. Musculoskeletal: Negative for back pain. Skin: Negative for rash. Neurological: Negative for headaches, focal weakness or numbness.  10-point ROS otherwise negative.  ____________________________________________   PHYSICAL EXAM:  VITAL SIGNS: ED Triage Vitals  Enc Vitals Group     BP --      Pulse Rate 01/07/16 1758 88     Resp 01/07/16 1758 18     Temp 01/07/16 1758 98.1 F (36.7 C)     Temp Source 01/07/16 1758 Oral     SpO2 01/07/16 1758 95 %     Weight 01/07/16 1758 150 lb (68.04 kg)     Height 01/07/16 1758 5\' 2"  (1.575 m)     Head Cir --      Peak Flow --      Pain Score 01/07/16 1758 10     Pain Loc --  Pain Edu? --      Excl. in [place]? --     Constitutional: Alert and oriented. Well appearing and in no acute  distress. Eyes: Conjunctivae are normal. PERRL. EOMI. Head: Atraumatic. Nose: No congestion/rhinnorhea. Mouth/Throat: Mucous membranes are moist.   Neck: No stridor.   Cardiovascular: Normal rate, regular rhythm. Grossly normal heart sounds.  Good peripheral circulation with equal and bilateral radial pulses. Respiratory: Normal respiratory effort.  No retractions. Lungs CTAB. Gastrointestinal: Soft and nontender. No distention. No CVA tenderness. Musculoskeletal: No lower extremity tenderness nor edema.  No joint effusions. Left shoulder with full range of motion but patient appears to have pain starting at about 15 above the horizontal. 4-5 strength which is likely limited secondary to pain. Sensation is intact to light touch. No deformity to the left shoulder. Minimal tenderness to palpation in left shoulder. No effusion. Neurologic:  Normal speech and language. No gross focal neurologic deficits are appreciated. Skin:  Skin is warm, dry and intact. No rash noted. Psychiatric: Mood and affect are normal. Speech and behavior are normal.  ____________________________________________   LABS (all labs ordered are listed, but only abnormal results are displayed)  Labs Reviewed  BASIC METABOLIC PANEL - Abnormal; Notable for the following:    Chloride 98 (*)    Glucose, Bld 130 (*)    BUN 21 (*)    All other components within normal limits  CBC - Abnormal; Notable for the following:    RDW 16.7 (*)    All other components within normal limits  TROPONIN I   ____________________________________________  EKG  ED ECG REPORT I, Doran Stabler, the attending physician, personally viewed and interpreted this ECG.   Date: 01/07/2016  EKG Time: 1826  Rate: 84  Rhythm: normal sinus rhythm  Axis: Left axis deviation  Intervals:none and right bundle branch block seen on previous EKGs.  ST&T Change: T wave inversions in V2 and V3 which are seen on previous EKGs. Also with inversions in  V4 and V5. No ST elevations or depressions. Lateral inversions seen on EKG from March 2014 and the V2 and V3 inversions are seen on her previous EKG from March 2013. ____________________________________________  RADIOLOGY  DG Chest 2 View (Final result) Result time: 01/07/16 18:56:27   Final result by Rad Results In Interface (01/07/16 18:56:27)   Narrative:   CLINICAL DATA: Neck pain radiating to LEFT arm. Pain for 2 weeks  EXAM: CHEST 2 VIEW  COMPARISON: 08/05/2015  FINDINGS: Enlarged cardiac silhouette similar prior. There is chronic atelectasis over the LEFT costophrenic angle. No effusion infiltrate. Mild bronchitic change centrally.  IMPRESSION: 1. No acute findings. No change from prior. 2. Cardiomegaly and LEFT basilar atelectasis.   Electronically Signed By: Suzy Bouchard M.D. On: 01/07/2016 18:56    ____________________________________________   PROCEDURES   Procedures  ____________________________________________   INITIAL IMPRESSION / ASSESSMENT AND PLAN / ED COURSE  Pertinent labs & imaging results that were available during my care of the patient were reviewed by me and considered in my medical decision making (see chart for details).  ----------------------------------------- 7:43 PM on 01/07/2016 -----------------------------------------  Reassuring lab work as well as chest x-ray. Pain is consistent with the cervical radiculitis of the patient has been experiencing for months. She appears to be in no acute distress at this time. I recommended that she increase her hydrocodone to 2 tabs twice a day as needed for pain. The patient as well as the family understanding of this plan and willing to  comply. They're also understanding that after the procedure that the pain may worsen before it gets better and that the true results of this injection may not be seen for 7-10 days. They'll be following up with her physiatrist.    ____________________________________________   FINAL CLINICAL IMPRESSION(S) / ED DIAGNOSES  Left shoulder pain.    NEW MEDICATIONS STARTED DURING THIS VISIT:  New Prescriptions   No medications on file     Note:  This document was prepared using Dragon voice recognition software and may include unintentional dictation errors.    Orbie Pyo, MD 01/07/16 1944  Patient states that the hydrocodone does not make her drowsy. I believe it will be safe to increase the dose.  Orbie Pyo, MD 01/07/16 [phone]

## 2016-01-07 NOTE — ED Notes (Signed)
Freddy Finner, granddaughter.  Signature pad not working.  Pt verbalized understanding of discharge instructions and has no further questions.

## 2016-01-07 NOTE — ED Notes (Signed)
Patient transported to X-ray 

## 2016-01-07 NOTE — ED Notes (Signed)
Pt presents to ED with pain that radiates from the left side of her neck to her left arm/shoulder into her hand for over two weeks. Pt states she was seen at [facility] clinic on 7/5 and dx with cervical radiculitis, herniated nucleus pulposus, and cervical spinal stenosis and tx with pain medications. Injection to her left clavicle on wednesdays to help with her pain with no improvement.  +pulses to affected arm and skin warm and dry.

## 2016-01-07 NOTE — Discharge Instructions (Signed)
You may increase your hydrocodone to 2 tabs, twice a day as needed for pain.   Shoulder Pain The shoulder is the joint that connects your arms to your body. The bones that form the shoulder joint include the upper arm bone (humerus), the shoulder blade (scapula), and the collarbone (clavicle). The top of the humerus is shaped like a ball and fits into a rather flat socket on the scapula (glenoid cavity). A combination of muscles and strong, fibrous tissues that connect muscles to bones (tendons) support your shoulder joint and hold the ball in the socket. Small, fluid-filled sacs (bursae) are located in different areas of the joint. They act as cushions between the bones and the overlying soft tissues and help reduce friction between the gliding tendons and the bone as you move your arm. Your shoulder joint allows a wide range of motion in your arm. This range of motion allows you to do things like scratch your back or throw a ball. However, this range of motion also makes your shoulder more prone to pain from overuse and injury. Causes of shoulder pain can originate from both injury and overuse and usually can be grouped in the following four categories:  Redness, swelling, and pain (inflammation) of the tendon (tendinitis) or the bursae (bursitis).  Instability, such as a dislocation of the joint.  Inflammation of the joint (arthritis).  Broken bone (fracture). HOME CARE INSTRUCTIONS   Apply ice to the sore area.  Put ice in a plastic bag.  Place a towel between your skin and the bag.  Leave the ice on for 15-20 minutes, 3-4 times per day for the first 2 days, or as directed by your health care provider.  Stop using cold packs if they do not help with the pain.  If you have a shoulder sling or immobilizer, wear it as long as your caregiver instructs. Only remove it to shower or bathe. Move your arm as little as possible, but keep your hand moving to prevent swelling.  Squeeze a soft ball  or foam pad as much as possible to help prevent swelling.  Only take over-the-counter or prescription medicines for pain, discomfort, or fever as directed by your caregiver. SEEK MEDICAL CARE IF:   Your shoulder pain increases, or new pain develops in your arm, hand, or fingers.  Your hand or fingers become cold and numb.  Your pain is not relieved with medicines. SEEK IMMEDIATE MEDICAL CARE IF:   Your arm, hand, or fingers are numb or tingling.  Your arm, hand, or fingers are significantly swollen or turn white or blue. MAKE SURE YOU:   Understand these instructions.  Will watch your condition.  Will get help right away if you are not doing well or get worse.   This information is not intended to replace advice given to you by your health care provider. Make sure you discuss any questions you have with your health care provider.   Document Released: 03/28/2005 Document Revised: 07/09/2014 Document Reviewed: 10/11/2014 Elsevier Interactive Patient Education [employer].

## 2016-01-12 ENCOUNTER — Ambulatory Visit: Payer: Medicare Other | Admitting: Radiation Oncology

## 2016-01-12 ENCOUNTER — Inpatient Hospital Stay: Admission: RE | Admit: 2016-01-12 | Payer: Medicare Other | Source: Ambulatory Visit | Admitting: Radiation Oncology

## 2016-01-13 ENCOUNTER — Emergency Department
Admission: EM | Admit: 2016-01-13 | Discharge: 2016-01-13 | Disposition: A | Discharge status: Home / Self Care | Payer: Medicare Other | Attending: Emergency Medicine | Admitting: Emergency Medicine

## 2016-01-13 DIAGNOSIS — Z7984 Long term (current) use of oral hypoglycemic drugs: Secondary | ICD-10-CM | POA: Insufficient documentation

## 2016-01-13 DIAGNOSIS — M069 Rheumatoid arthritis, unspecified: Secondary | ICD-10-CM | POA: Diagnosis not present

## 2016-01-13 DIAGNOSIS — Z7982 Long term (current) use of aspirin: Secondary | ICD-10-CM | POA: Diagnosis not present

## 2016-01-13 DIAGNOSIS — M1990 Unspecified osteoarthritis, unspecified site: Secondary | ICD-10-CM | POA: Diagnosis not present

## 2016-01-13 DIAGNOSIS — N390 Urinary tract infection, site not specified: Secondary | ICD-10-CM

## 2016-01-13 DIAGNOSIS — Z853 Personal history of malignant neoplasm of breast: Secondary | ICD-10-CM | POA: Diagnosis not present

## 2016-01-13 DIAGNOSIS — E785 Hyperlipidemia, unspecified: Secondary | ICD-10-CM | POA: Diagnosis not present

## 2016-01-13 DIAGNOSIS — F329 Major depressive disorder, single episode, unspecified: Secondary | ICD-10-CM | POA: Diagnosis not present

## 2016-01-13 DIAGNOSIS — E119 Type 2 diabetes mellitus without complications: Secondary | ICD-10-CM | POA: Diagnosis not present

## 2016-01-13 DIAGNOSIS — I10 Essential (primary) hypertension: Secondary | ICD-10-CM | POA: Insufficient documentation

## 2016-01-13 DIAGNOSIS — R300 Dysuria: Secondary | ICD-10-CM | POA: Diagnosis present

## 2016-01-13 LAB — BASIC METABOLIC PANEL
ANION GAP: 6 (ref 5–15)
BUN: 23 mg/dL — ABNORMAL HIGH (ref 6–20)
CALCIUM: 9.5 mg/dL (ref 8.9–10.3)
CHLORIDE: 104 mmol/L (ref 101–111)
CO2: 26 mmol/L (ref 22–32)
Creatinine, Ser: 0.87 mg/dL (ref 0.44–1.00)
GFR calc non Af Amer: >60 mL/min (ref >60)
GLUCOSE: 142 mg/dL — AB (ref 65–99)
Potassium: 4.3 mmol/L (ref 3.5–5.1)
Sodium: 136 mmol/L (ref 135–145)

## 2016-01-13 LAB — URINALYSIS COMPLETE WITH MICROSCOPIC (ARMC ONLY)
SPECIFIC GRAVITY, URINE: 1.027 (ref 1.005–1.030)
Squamous Epithelial / LPF: NONE SEEN
Trans Epithel, UA: 2

## 2016-01-13 LAB — CBC
HEMATOCRIT: 34.9 % — AB (ref 35.0–47.0)
HEMOGLOBIN: 12.2 g/dL (ref 12.0–16.0)
MCH: 32.5 pg (ref 26.0–34.0)
MCHC: 35 g/dL (ref 32.0–36.0)
MCV: 92.9 fL (ref 80.0–100.0)
Platelets: 168 10*3/uL (ref 150–440)
RBC: 3.76 MIL/uL — ABNORMAL LOW (ref 3.80–5.20)
RDW: 16.7 % — AB (ref 11.5–14.5)
WBC: 7.7 10*3/uL (ref 3.6–11.0)

## 2016-01-13 MED ORDER — CEPHALEXIN 500 MG PO CAPS: 500.0000 mg | ORAL_CAPSULE | Freq: Three times a day (TID) | ORAL | Status: DC

## 2016-01-13 MED ORDER — DEXTROSE 5 % IV SOLN
1.0000 g | Freq: Once | INTRAVENOUS | Status: AC
  Administered 2016-01-13: 1 g via INTRAVENOUS
  Filled 2016-01-13 (×2): qty 10

## 2016-01-13 NOTE — Discharge Instructions (Signed)
Please seek medical attention for any high fevers, chest pain, shortness of breath, change in behavior, persistent vomiting, bloody stool or any other new or concerning symptoms.  Urinary Tract Infection A urinary tract infection (UTI) can occur any place along the urinary tract. The tract includes the kidneys, ureters, bladder, and urethra. A type of germ called bacteria often causes a UTI. UTIs are often helped with antibiotic medicine.  HOME CARE   If given, take antibiotics as told by your doctor. Finish them even if you start to feel better.  Drink enough fluids to keep your pee (urine) clear or pale yellow.  Avoid tea, drinks with caffeine, and bubbly (carbonated) drinks.  Pee often. Avoid holding your pee in for a long time.  Pee before and after having sex (intercourse).  Wipe from front to back after you poop (bowel movement) if you are a woman. Use each tissue only once. GET HELP RIGHT AWAY IF:   You have back pain.  You have lower belly (abdominal) pain.  You have chills.  You feel sick to your stomach (nauseous).  You throw up (vomit).  Your burning or discomfort with peeing does not go away.  You have a fever.  Your symptoms are not better in 3 days. MAKE SURE YOU:   Understand these instructions.  Will watch your condition.  Will get help right away if you are not doing well or get worse.   This information is not intended to replace advice given to you by your health care provider. Make sure you discuss any questions you have with your health care provider.   Document Released: 12/05/2007 Document Revised: 07/09/2014 Document Reviewed: 01/17/2012 Elsevier Interactive Patient Education [employer].

## 2016-01-13 NOTE — ED Provider Notes (Signed)
[facility] Emergency Department Provider Note    ____________________________________________  Time seen: ~0315  I have reviewed the triage vital signs and the nursing notes.   HISTORY  Chief Complaint Dysuria   History limited by: Not Limited   HPI [name] is a 82 y.o. female who presents to the emergency department today because of concerns for possible urinary tract infection. Patient has been having pain with urination. Additionally she has felt some pressure in the suprapubic region. The symptoms started 2 days ago. Roughly 1 week ago she finished a course of antibiotics for UTI. It sounds like the patient has recurrent UTIs frequently. She was put on clindamycin at that time. Patient denies any fevers, nausea or vomiting.    Past Medical History  Diagnosis Date  . Diabetes mellitus without complication ([place])   . Breast cancer ([place]) 2015    right- radiation  . Anxiety   . Cough   . Depression   . Gout   . HLD (hyperlipidemia)   . HTN (hypertension)   . Pulmonary nodule   . Interstitial lung disease ([place])   . Lumbar spinal stenosis   . Macular degeneration   . Migraines   . Osteoarthritis   . Phlebitis   . Rheumatoid arthritis [facility])     Patient Active Problem List   Diagnosis Date Noted  . Recurrent UTI 10/06/2015  . Vaginal atrophy 10/06/2015  . Diabetes mellitus, type 2 ([place]) 08/02/2015  . ILD (interstitial lung disease) ([place]) 08/02/2015  . Headache, migraine 08/02/2015  . Inflammation of a vein 08/02/2015  . Anxiety and depression 10/15/2013  . Essential (primary) hypertension 10/15/2013  . Gout 10/15/2013  . Degeneration macular 10/15/2013  . Combined fat and carbohydrate induced hyperlipemia 10/15/2013  . Arthritis, degenerative 10/15/2013  . Arthritis or polyarthritis, rheumatoid ([place]) 10/15/2013    Past Surgical History  Procedure Laterality Date  . Breast biopsy Right 02/21/2015    path pending  . Abdominal  hysterectomy    . Colonoscopy    . Esophagogastroduodenoscopy    . Mastectomy, partial      Current Outpatient Rx  Name  Route  Sig  Dispense  Refill  . acetaminophen (TYLENOL) 500 MG tablet   Oral   Take 1,000 mg by mouth every 4 (four) hours as needed for mild pain or headache. Reported on 08/08/2015         . Adalimumab (HUMIRA) 40 MG/0.8ML PSKT   Subcutaneous   Inject 40 mg into the skin every 21 ( twenty-one) days.         Marland Kitchen albuterol (PROVENTIL HFA;VENTOLIN HFA) 108 (90 Base) MCG/ACT inhaler   Inhalation   Inhale 2 puffs into the lungs every 6 (six) hours as needed for wheezing or shortness of breath. Reported on 10/04/2015         . allopurinol (ZYLOPRIM) 300 MG tablet   Oral   Take 300 mg by mouth daily.         Marland Kitchen aspirin EC 325 MG tablet   Oral   Take 325 mg by mouth daily.          . benzonatate (TESSALON) 200 MG capsule   Oral   Take by mouth. Reported on 10/04/2015         . carvedilol (COREG) 6.25 MG tablet   Oral   Take 6.25 mg by mouth 2 (two) times daily with a meal.         . folic acid (FOLVITE) A999333  MCG tablet   Oral   Take 400 mcg by mouth daily.         . furosemide (LASIX) 20 MG tablet      Reported on 10/04/2015         . losartan (COZAAR) 100 MG tablet   Oral   Take 100 mg by mouth daily.         Marland Kitchen lovastatin (MEVACOR) 40 MG tablet   Oral   Take 40 mg by mouth at bedtime.         . metFORMIN (GLUCOPHAGE) 500 MG tablet   Oral   Take 500 mg by mouth 2 (two) times daily with a meal.         . methotrexate (RHEUMATREX) 2.5 MG tablet   Oral   Take 15 mg by mouth once a week. Pt takes on Wednesday.   Caution:Chemotherapy. Protect from light.         . Multiple Vitamin (MULTIVITAMIN WITH MINERALS) TABS tablet   Oral   Take 1 tablet by mouth daily.         . ondansetron (ZOFRAN ODT) 4 MG disintegrating tablet   Oral   Take 1 tablet (4 mg total) by mouth every 6 (six) hours as needed for nausea or vomiting. Patient  not taking: Reported on 08/08/2015   20 tablet   0   . PARoxetine (PAXIL) 30 MG tablet   Oral   Take 30 mg by mouth daily.            Allergies Lisinopril; Penicillins; Clarithromycin; and Sulfa antibiotics  Family History  Problem Relation Age of Onset  . Colon cancer Mother   . Stomach cancer Mother   . Esophageal cancer Mother   . COPD Father   . Colon polyps Sister   . Ovarian cancer Sister   . Kidney disease Neg Hx   . Bladder Cancer Neg Hx     Social History Social History  Substance Use Topics  . Smoking status: Never Smoker   . Smokeless tobacco: Never Used  . Alcohol Use: No    Review of Systems  Constitutional: Negative for fever. Cardiovascular: Negative for chest pain. Respiratory: Negative for shortness of breath. Gastrointestinal: Positive for suprapubic pressure Genitourinary: Positive for dysuria. Musculoskeletal: Negative for back pain. Skin: Negative for rash. Neurological: Negative for headaches, focal weakness or numbness.  10-point ROS otherwise negative.  ____________________________________________   PHYSICAL EXAM:  VITAL SIGNS: ED Triage Vitals  Enc Vitals Group     BP 01/13/16 0038 128/67 mmHg     Pulse Rate 01/13/16 0038 72     Resp 01/13/16 0038 18     Temp 01/13/16 0038 98.2 F (36.8 C)     Temp Source 01/13/16 0038 Oral     SpO2 01/13/16 0038 97 %     Weight 01/13/16 0038 150 lb (68.04 kg)     Height 01/13/16 0038 5\' 2"  (1.575 m)     Head Cir --      Peak Flow --      Pain Score 01/13/16 0038 5   Constitutional: Alert and oriented. Well appearing and in no distress. Eyes: Conjunctivae are normal. PERRL. Normal extraocular movements. ENT   Head: Normocephalic and atraumatic.   Nose: No congestion/rhinnorhea.   Mouth/Throat: Mucous membranes are moist.   Neck: No stridor. Hematological/Lymphatic/Immunilogical: No cervical lymphadenopathy. Cardiovascular: Normal rate, regular rhythm.  No murmurs, rubs,  or gallops. Respiratory: Normal respiratory effort without tachypnea nor retractions. Breath sounds are  clear and equal bilaterally. No wheezes/rales/rhonchi. Gastrointestinal: Soft and minimally tender in the suprapubic region.. No distention. There is no CVA tenderness. Genitourinary: Deferred Musculoskeletal: Normal range of motion in all extremities. No joint effusions.  No lower extremity tenderness nor edema. Neurologic:  Normal speech and language. No gross focal neurologic deficits are appreciated.  Skin:  Skin is warm, dry and intact. No rash noted. Psychiatric: Mood and affect are normal. Speech and behavior are normal. Patient exhibits appropriate insight and judgment.  ____________________________________________    LABS (pertinent positives/negatives)  Labs Reviewed  URINALYSIS COMPLETEWITH MICROSCOPIC ([place]) - Abnormal; Notable for the following:    Color, Urine ORANGE (*)    APPearance CLOUDY (*)    Glucose, UA   (*)    Value: TEST NOT REPORTED DUE TO COLOR INTERFERENCE OF URINE PIGMENT   Bilirubin Urine   (*)    Value: TEST NOT REPORTED DUE TO COLOR INTERFERENCE OF URINE PIGMENT   Ketones, ur   (*)    Value: TEST NOT REPORTED DUE TO COLOR INTERFERENCE OF URINE PIGMENT   Hgb urine dipstick   (*)    Value: TEST NOT REPORTED DUE TO COLOR INTERFERENCE OF URINE PIGMENT   Protein, ur   (*)    Value: TEST NOT REPORTED DUE TO COLOR INTERFERENCE OF URINE PIGMENT   Nitrite   (*)    Value: TEST NOT REPORTED DUE TO COLOR INTERFERENCE OF URINE PIGMENT   Leukocytes, UA   (*)    Value: TEST NOT REPORTED DUE TO COLOR INTERFERENCE OF URINE PIGMENT   Bacteria, UA MANY (*)    All other components within normal limits  CBC - Abnormal; Notable for the following:    RBC 3.76 (*)    HCT 34.9 (*)    RDW 16.7 (*)    All other components within normal limits  BASIC METABOLIC PANEL - Abnormal; Notable for the following:    Glucose, Bld 142 (*)    BUN 23 (*)    All other  components within normal limits  URINE CULTURE     ____________________________________________   EKG  None  ____________________________________________    RADIOLOGY  None  ____________________________________________   PROCEDURES  Procedure(s) performed: None  Critical Care performed: No  ____________________________________________   INITIAL IMPRESSION / ASSESSMENT AND PLAN / ED COURSE  Pertinent labs & imaging results that were available during my care of the patient were reviewed by me and considered in my medical decision making (see chart for details).  Patient presented to the emergency department today because of concerns for suprapubic pressure and dysuria. Urinalysis consistent with urinary tract infection. Patient will be given dose of IV antibiotics here. She states that her reaction to penicillin is only rash thus I think it is safe for Rocephin. Will plan on discharging home with Keflex.  ____________________________________________   FINAL CLINICAL IMPRESSION(S) / ED DIAGNOSES  Final diagnoses:  UTI (lower urinary tract infection)     Note: This dictation was prepared with Dragon dictation. Any transcriptional errors that result from this process are unintentional    Nance Pear, MD 01/13/16 [phone]

## 2016-01-13 NOTE — ED Notes (Signed)
Assisted pt to room via wheelchair; changing into gown for MD

## 2016-01-13 NOTE — ED Notes (Signed)
Pt had UTI approx 2 weeks ago and finished atb but now states she is having the same symptoms has before and pain with urination - pt started taking azo today - pt reports that she is unable to empty bladder but bladder scan shows only 26-28 cc of urine in bladder

## 2016-01-13 NOTE — ED Notes (Signed)
Bladder scan result 26-28cc

## 2016-01-13 NOTE — ED Notes (Signed)
Pt has had recent UTI was treated with clindamycin and finished last week. States symptoms had improved but since yest has had painful urination yest and states now she is retaining urine.

## 2016-01-13 NOTE — ED Notes (Signed)
Pharmacy called to send down Rocephin because there is none in the pyxis

## 2016-01-15 LAB — URINE CULTURE

## 2016-02-07 ENCOUNTER — Encounter: Payer: Self-pay | Admitting: Emergency Medicine

## 2016-02-07 ENCOUNTER — Emergency Department
Admission: EM | Admit: 2016-02-07 | Discharge: 2016-02-07 | Disposition: A | Discharge status: Home / Self Care | Payer: Medicare Other | Attending: Emergency Medicine | Admitting: Emergency Medicine

## 2016-02-07 DIAGNOSIS — Z853 Personal history of malignant neoplasm of breast: Secondary | ICD-10-CM | POA: Insufficient documentation

## 2016-02-07 DIAGNOSIS — I10 Essential (primary) hypertension: Secondary | ICD-10-CM | POA: Insufficient documentation

## 2016-02-07 DIAGNOSIS — M542 Cervicalgia: Secondary | ICD-10-CM | POA: Diagnosis present

## 2016-02-07 DIAGNOSIS — E119 Type 2 diabetes mellitus without complications: Secondary | ICD-10-CM | POA: Insufficient documentation

## 2016-02-07 DIAGNOSIS — M5412 Radiculopathy, cervical region: Secondary | ICD-10-CM

## 2016-02-07 DIAGNOSIS — M5010 Cervical disc disorder with radiculopathy, unspecified cervical region: Secondary | ICD-10-CM | POA: Insufficient documentation

## 2016-02-07 DIAGNOSIS — Z7984 Long term (current) use of oral hypoglycemic drugs: Secondary | ICD-10-CM | POA: Insufficient documentation

## 2016-02-07 DIAGNOSIS — M5030 Other cervical disc degeneration, unspecified cervical region: Secondary | ICD-10-CM

## 2016-02-07 HISTORY — DX: Essential (primary) hypertension: I10

## 2016-02-07 MED ORDER — PREDNISONE 20 MG PO TABS: 20.0000 mg | ORAL_TABLET | Freq: Every day | ORAL | 0 refills | Status: DC

## 2016-02-07 MED ORDER — OXYCODONE HCL 5 MG PO TABS: 5.0000 mg | ORAL_TABLET | Freq: Every evening | ORAL | 0 refills | Status: DC | PRN

## 2016-02-07 NOTE — Discharge Instructions (Signed)
Increase the dose of Gabapentin to a total of 3 tabs every morning, noon, and night, over the next week as described. Take the additional Immediate Release Oxycodone at bedtime for pain relief. Take the steroid as directed. Follow-up with your provider as needed.

## 2016-02-07 NOTE — ED Notes (Signed)
Patient presents to ED with complaint of pain to her neck radiating down her left arm to her finger tips. Patient has a bulging disc and has had chronic pain. Patient reports that the pain became worse today and that the pain medication is not working for her. Patient last percocet at 14:30 5/325 1tab and IBU 400 mg at 19:00. Pt is seen by physiatry pain specialist. Patient is scheduled to see a surgeon on the 25 th for consultation. Pt appears uncomfortable facial grimacing restless. Pt has had 2 injection at the source no help. That's why he put in to consult to  surgeon ([facility] spinal surgeon).

## 2016-02-07 NOTE — ED Triage Notes (Signed)
Patient here with complaint of pain to her neck radiating down her left arm. Patient has a bulging disc and has had chronic pain times two months. Patient reports that the pain became worse today and that the pain medication is not working for her. Patient last percocet at 14:30 and ibu at 19:00. Patient is scheduled to see a surgeon on the 25th for consultation.

## 2016-02-07 NOTE — ED Notes (Signed)
Pt discharged to home.  Family member driving.  Discharge instructions reviewed.  Verbalized understanding.  No questions or concerns at this time.  Teach back verified.  Pt in NAD.  No items left in ED.   

## 2016-02-14 ENCOUNTER — Telehealth: Payer: Self-pay | Admitting: *Deleted

## 2016-02-14 NOTE — Telephone Encounter (Signed)
Patient notified of appointment change. Verbalized understanding of new date/time.

## 2016-02-15 ENCOUNTER — Ambulatory Visit: Payer: Medicare Other | Admitting: Radiation Oncology

## 2016-02-21 ENCOUNTER — Other Ambulatory Visit: Payer: Self-pay | Admitting: *Deleted

## 2016-02-21 DIAGNOSIS — C50411 Malignant neoplasm of upper-outer quadrant of right female breast: Secondary | ICD-10-CM

## 2016-02-22 ENCOUNTER — Encounter: Payer: Self-pay | Admitting: Physician Assistant

## 2016-02-22 NOTE — ED Provider Notes (Signed)
[name] Emergency Department Provider Note ____________________________________________  Time seen: 2102   I have reviewed the triage vital signs and the nursing notes.  HISTORY  Chief Complaint  Neck Pain  HPI [name] is a 82 y.o. female presents to the ED accompanied by her adult daughter for management of increased pain above her baseline. She give a history of recently diagnosed cervical DDD with LUE referral. She is being referred to [facility] for surgical consultation. In the meantime, she is being managed by her PCP and Dr. [name]. She is currently taking Percocet along with ibuprofen and Gabapentin. She reports increased pain over the last day or so. She reports her pain is not currently affected by her regimen. She denies recent injury, trauma, or weakness.   Past Medical History:  Diagnosis Date  . Anxiety   . Breast cancer ([place]) 2015   right- radiation  . Cough   . Depression   . Diabetes mellitus without complication ([place])   . Gout   . HLD (hyperlipidemia)   . HTN (hypertension)   . Hypertension   . Interstitial lung disease ([place])   . Lumbar spinal stenosis   . Macular degeneration   . Migraines   . Osteoarthritis   . Phlebitis   . Pulmonary nodule   . Rheumatoid arthritis [facility])     Patient Active Problem List   Diagnosis Date Noted  . Recurrent UTI 10/06/2015  . Vaginal atrophy 10/06/2015  . Diabetes mellitus, type 2 ([place]) 08/02/2015  . ILD (interstitial lung disease) ([place]) 08/02/2015  . Headache, migraine 08/02/2015  . Inflammation of a vein 08/02/2015  . Anxiety and depression 10/15/2013  . Essential (primary) hypertension 10/15/2013  . Gout 10/15/2013  . Degeneration macular 10/15/2013  . Combined fat and carbohydrate induced hyperlipemia 10/15/2013  . Arthritis, degenerative 10/15/2013  . Arthritis or polyarthritis, rheumatoid ([place]) 10/15/2013    Past Surgical History:  Procedure  Laterality Date  . ABDOMINAL HYSTERECTOMY    . BREAST BIOPSY Right 02/21/2015   path pending  . COLONOSCOPY    . ESOPHAGOGASTRODUODENOSCOPY    . MASTECTOMY, PARTIAL      Prior to Admission medications   Medication Sig Start Date End Date Taking? Authorizing Provider  acetaminophen (TYLENOL) 500 MG tablet Take 1,000 mg by mouth every 4 (four) hours as needed for mild pain or headache. Reported on 08/08/2015    Historical Provider, MD  Adalimumab (HUMIRA) 40 MG/0.8ML PSKT Inject 40 mg into the skin every 21 ( twenty-one) days.    Historical Provider, MD  albuterol (PROVENTIL HFA;VENTOLIN HFA) 108 (90 Base) MCG/ACT inhaler Inhale 2 puffs into the lungs every 6 (six) hours as needed for wheezing or shortness of breath. Reported on 10/04/2015    Historical Provider, MD  allopurinol (ZYLOPRIM) 300 MG tablet Take 300 mg by mouth daily.    Historical Provider, MD  aspirin EC 325 MG tablet Take 325 mg by mouth daily.     Historical Provider, MD  benzonatate (TESSALON) 200 MG capsule Take by mouth. Reported on 10/04/2015 08/05/15   Historical Provider, MD  carvedilol (COREG) 6.25 MG tablet Take 6.25 mg by mouth 2 (two) times daily with a meal.    Historical Provider, MD  cephALEXin (KEFLEX) 500 MG capsule Take 1 capsule (500 mg total) by mouth 3 (three) times daily. 01/13/16   Nance Pear, MD  folic acid (FOLVITE) A999333 MCG tablet Take 400 mcg by mouth daily.    Historical Provider, MD  furosemide (LASIX) 20 MG tablet Reported on 10/04/2015 08/05/15   Historical Provider, MD  losartan (COZAAR) 100 MG tablet Take 100 mg by mouth daily.    Historical Provider, MD  lovastatin (MEVACOR) 40 MG tablet Take 40 mg by mouth at bedtime.    Historical Provider, MD  metFORMIN (GLUCOPHAGE) 500 MG tablet Take 500 mg by mouth 2 (two) times daily with a meal.    Historical Provider, MD  methotrexate (RHEUMATREX) 2.5 MG tablet Take 15 mg by mouth once a week. Pt takes on Wednesday.   Caution:Chemotherapy. Protect from light.     Historical Provider, MD  Multiple Vitamin (MULTIVITAMIN WITH MINERALS) TABS tablet Take 1 tablet by mouth daily.    Historical Provider, MD  ondansetron (ZOFRAN ODT) 4 MG disintegrating tablet Take 1 tablet (4 mg total) by mouth every 6 (six) hours as needed for nausea or vomiting. Patient not taking: Reported on 08/08/2015 08/05/15   Delman Kitten, MD  oxyCODONE (ROXICODONE) 5 MG immediate release tablet Take 1 tablet (5 mg total) by mouth at bedtime as needed for severe pain. 02/07/16   Jaice Digioia V Bacon Donivan Thammavong, PA-C  PARoxetine (PAXIL) 30 MG tablet Take 30 mg by mouth daily.     Historical Provider, MD  predniSONE (DELTASONE) 20 MG tablet Take 1 tablet (20 mg total) by mouth daily with breakfast. 02/07/16   Dannielle Karvonen Elane Peabody, PA-C    Allergies Lisinopril; Penicillins; Clarithromycin; and Sulfa antibiotics  Family History  Problem Relation Age of Onset  . Colon cancer Mother   . Stomach cancer Mother   . Esophageal cancer Mother   . COPD Father   . Colon polyps Sister   . Ovarian cancer Sister   . Kidney disease Neg Hx   . Bladder Cancer Neg Hx     Social History Social History  Substance Use Topics  . Smoking status: Never Smoker  . Smokeless tobacco: Never Used  . Alcohol use No    Review of Systems  Constitutional: Negative for fever. Cardiovascular: Negative for chest pain. Musculoskeletal: Positive for neck pain as above. Skin: Negative for rash. Neurological: Negative for headaches, focal weakness or numbness. ____________________________________________  PHYSICAL EXAM:  VITAL SIGNS: ED Triage Vitals [02/07/16 2011]  Enc Vitals Group     BP (!) 189/82     Pulse Rate 80     Resp (!) 22     Temp 98 F (36.7 C)     Temp Source Oral     SpO2 98 %     Weight 150 lb (68 kg)     Height 5\' 2"  (1.575 m)     Head Circumference      Peak Flow      Pain Score 10     Pain Loc      Pain Edu?      Excl. in [place]?     Constitutional: Alert and oriented. Well appearing and  in no distress. Head: Normocephalic and atraumatic. Cardiovascular: Normal rate, regular rhythm.  Respiratory: Normal respiratory effort. No wheezes/rales/rhonchi. Gastrointestinal: Soft and nontender. No distention. Musculoskeletal: Normal spinal alignment without midline tenderness, spasm, or step-off. Normal UE strength. Nontender with normal range of motion in all extremities.  Neurologic: CN II-XII grossly intact. Normal grips bilaterally. Normal gait without ataxia. Normal speech and language. No gross focal neurologic deficits are appreciated. Skin:  Skin is warm, dry and intact. No rash noted. Psychiatric: Mood and affect are normal. Patient exhibits appropriate insight and judgment. ____________________________________________   RADIOLOGY  MRI Cervical Spine 10/2015  IMPRESSION: 1. Severe cervical spine degeneration, including severe upper cervical facet arthropathy in the setting of multilevel chronic cervical spondylolisthesis. 2. Spinal stenosis with spinal cord mass effect at C5-C6, but no associated cord signal abnormality. Spinal stenosis without spinal cord mass effect at C3-C4, C6-C7, C7-T1. 3. Severe degenerative neural foraminal stenosis at the left C3, bilateral C4, left C5, bilateral C6, and bilateral C7 nerve levels. Moderate cervical foraminal stenosis elsewhere. ____________________________________________  INITIAL IMPRESSION / ASSESSMENT AND PLAN / ED COURSE  Patient with a MRI-confirmed left neural foraminal stenosis at C3-C7. She presents with pain above baseline. She is schedule for surgical consultation in 2 weeks after 2 failed injections. She is advised to increase Gabapentin dosing up to 2 tabs TID over the next week, as tolerated. She is provided with a prescription for #15 Roxicet for qhs dosing for increased pain. She will continue with her 1-1.5 tabs of Percocet as directed during the day. Follow-up with Dr. [name] for interim management. Return  precautions are reviewed.   Clinical Course   ____________________________________________  FINAL CLINICAL IMPRESSION(S) / ED DIAGNOSES  Final diagnoses:  DDD (degenerative disc disease), cervical  Left cervical radiculopathy     Melvenia Needles, PA-C 02/22/16 1815    Harvest Dark, MD 02/23/16 [phone]

## 2016-02-27 ENCOUNTER — Other Ambulatory Visit: Payer: Self-pay | Admitting: Neurosurgery

## 2016-03-01 ENCOUNTER — Ambulatory Visit: Payer: Medicare Other | Admitting: Radiation Oncology

## 2016-03-07 ENCOUNTER — Other Ambulatory Visit: Payer: Self-pay | Admitting: Radiation Oncology

## 2016-03-07 ENCOUNTER — Ambulatory Visit
Admission: RE | Admit: 2016-03-07 | Discharge: 2016-03-07 | Disposition: A | Discharge status: Home / Self Care | Payer: Medicare Other | Source: Ambulatory Visit | Attending: Radiation Oncology | Admitting: Radiation Oncology

## 2016-03-07 DIAGNOSIS — C50411 Malignant neoplasm of upper-outer quadrant of right female breast: Secondary | ICD-10-CM | POA: Insufficient documentation

## 2016-03-07 DIAGNOSIS — N63 Unspecified lump in breast: Secondary | ICD-10-CM | POA: Diagnosis not present

## 2016-03-08 ENCOUNTER — Ambulatory Visit
Admission: RE | Admit: 2016-03-08 | Discharge: 2016-03-08 | Disposition: A | Discharge status: Home / Self Care | Payer: Medicare Other | Source: Ambulatory Visit | Attending: Radiation Oncology | Admitting: Radiation Oncology

## 2016-03-08 ENCOUNTER — Ambulatory Visit: Payer: Medicare Other | Admitting: Radiation Oncology

## 2016-03-08 ENCOUNTER — Other Ambulatory Visit: Payer: Self-pay | Admitting: Radiation Oncology

## 2016-03-08 DIAGNOSIS — N6310 Unspecified lump in the right breast, unspecified quadrant: Secondary | ICD-10-CM

## 2016-03-12 ENCOUNTER — Other Ambulatory Visit: Payer: Self-pay | Admitting: Radiation Oncology

## 2016-03-12 DIAGNOSIS — N6310 Unspecified lump in the right breast, unspecified quadrant: Secondary | ICD-10-CM

## 2016-03-20 ENCOUNTER — Other Ambulatory Visit: Payer: Self-pay | Admitting: Radiation Oncology

## 2016-03-20 ENCOUNTER — Ambulatory Visit
Admission: RE | Admit: 2016-03-20 | Discharge: 2016-03-20 | Disposition: A | Discharge status: Home / Self Care | Payer: Medicare Other | Source: Ambulatory Visit | Attending: Radiation Oncology | Admitting: Radiation Oncology

## 2016-03-20 DIAGNOSIS — N6310 Unspecified lump in the right breast, unspecified quadrant: Secondary | ICD-10-CM

## 2016-03-20 MED ORDER — GADOBENATE DIMEGLUMINE 529 MG/ML IV SOLN
14.0000 mL | Freq: Once | INTRAVENOUS | Status: AC | PRN
  Administered 2016-03-20: 14 mL via INTRAVENOUS

## 2016-03-26 ENCOUNTER — Other Ambulatory Visit: Payer: Self-pay | Admitting: *Deleted

## 2016-03-26 DIAGNOSIS — N6310 Unspecified lump in the right breast, unspecified quadrant: Secondary | ICD-10-CM

## 2016-03-27 ENCOUNTER — Encounter (HOSPITAL_COMMUNITY): Payer: Self-pay

## 2016-03-27 ENCOUNTER — Other Ambulatory Visit: Payer: Self-pay

## 2016-03-27 ENCOUNTER — Encounter (HOSPITAL_COMMUNITY)
Admission: RE | Admit: 2016-03-27 | Discharge: 2016-03-27 | Disposition: A | Discharge status: Home / Self Care | Payer: Medicare Other | Source: Ambulatory Visit | Attending: Neurosurgery | Admitting: Neurosurgery

## 2016-03-27 DIAGNOSIS — Z01812 Encounter for preprocedural laboratory examination: Secondary | ICD-10-CM | POA: Diagnosis not present

## 2016-03-27 DIAGNOSIS — M4712 Other spondylosis with myelopathy, cervical region: Secondary | ICD-10-CM | POA: Insufficient documentation

## 2016-03-27 DIAGNOSIS — M4722 Other spondylosis with radiculopathy, cervical region: Secondary | ICD-10-CM | POA: Insufficient documentation

## 2016-03-27 LAB — GLUCOSE, CAPILLARY: GLUCOSE-CAPILLARY: 124 mg/dL — AB (ref 65–99)

## 2016-03-27 LAB — BASIC METABOLIC PANEL
ANION GAP: 9 (ref 5–15)
BUN: 12 mg/dL (ref 6–20)
CALCIUM: 9.8 mg/dL (ref 8.9–10.3)
CO2: 27 mmol/L (ref 22–32)
Chloride: 102 mmol/L (ref 101–111)
Creatinine, Ser: 0.73 mg/dL (ref 0.44–1.00)
GFR calc Af Amer: >60 mL/min (ref >60)
GLUCOSE: 131 mg/dL — AB (ref 65–99)
POTASSIUM: 3.6 mmol/L (ref 3.5–5.1)
SODIUM: 138 mmol/L (ref 135–145)

## 2016-03-27 LAB — CBC
HCT: 37.9 % (ref 36.0–46.0)
Hemoglobin: 12.3 g/dL (ref 12.0–15.0)
MCH: 30.6 pg (ref 26.0–34.0)
MCHC: 32.5 g/dL (ref 30.0–36.0)
MCV: 94.3 fL (ref 78.0–100.0)
PLATELETS: 207 10*3/uL (ref 150–400)
RBC: 4.02 MIL/uL (ref 3.87–5.11)
RDW: 14.2 % (ref 11.5–15.5)
WBC: 6.6 10*3/uL (ref 4.0–10.5)

## 2016-03-27 LAB — SURGICAL PCR SCREEN
MRSA, PCR: NEGATIVE
Staphylococcus aureus: NEGATIVE

## 2016-03-27 MED ORDER — CHLORHEXIDINE GLUCONATE CLOTH 2 % EX PADS: 6.0000 | MEDICATED_PAD | Freq: Once | CUTANEOUS | Status: DC

## 2016-03-27 NOTE — Pre-Procedure Instructions (Signed)
    [name]  03/27/2016      [facility] [place] [facility] [place], [address] [facility] [address] AT [address] [facility] [place] [address] Phone: [phone] Fax: [phone]    Your procedure is scheduled on 04/04/16.  Report to [facility] Admitting at 12 noon  Call this number if you have problems the morning of surgery:  [phone]   Remember:  Do not eat food or drink liquids after midnight.  Take these medicines the morning of surgery with A SIP OF WATER -tylenol,all inhalers,carvedilol,oxycodone   Do not wear jewelry, make-up or nail polish.  Do not wear lotions, powders, or perfumes, or deoderant.  Do not shave 48 hours prior to surgery.  Men may shave face and neck.  Do not bring valuables to the hospital.  [facility] is not responsible for any belongings or valuables.  Contacts, dentures or bridgework may not be worn into surgery.  Leave your suitcase in the car.  After surgery it may be brought to your room.  For patients admitted to the hospital, discharge time will be determined by your treatment team.  Patients discharged the day of surgery will not be allowed to drive home.   Name and phone number of your driver:    Special instructions:  Do not take any aspirin,anti-inflammatories,vitamins,or herbal supplements 5-7 days prior to surgery.  Please read over the following fact sheets that you were given. MRSA Information

## 2016-03-28 LAB — HEMOGLOBIN A1C
HEMOGLOBIN A1C: 7 % — AB (ref 4.8–5.6)
MEAN PLASMA GLUCOSE: 154 mg/dL

## 2016-03-29 ENCOUNTER — Ambulatory Visit
Admission: RE | Admit: 2016-03-29 | Discharge: 2016-03-29 | Disposition: A | Discharge status: Home / Self Care | Payer: Medicare Other | Source: Ambulatory Visit | Attending: Radiation Oncology | Admitting: Radiation Oncology

## 2016-03-29 DIAGNOSIS — N6489 Other specified disorders of breast: Secondary | ICD-10-CM | POA: Insufficient documentation

## 2016-03-29 DIAGNOSIS — N6310 Unspecified lump in the right breast, unspecified quadrant: Secondary | ICD-10-CM

## 2016-03-29 DIAGNOSIS — N641 Fat necrosis of breast: Secondary | ICD-10-CM | POA: Diagnosis not present

## 2016-03-29 DIAGNOSIS — N63 Unspecified lump in breast: Secondary | ICD-10-CM | POA: Diagnosis present

## 2016-03-29 HISTORY — PX: BREAST BIOPSY: SHX20

## 2016-03-30 LAB — SURGICAL PATHOLOGY

## 2016-04-01 ENCOUNTER — Telehealth: Payer: Self-pay | Admitting: Urology

## 2016-04-01 NOTE — Telephone Encounter (Signed)
Patient has had 2 urinary tract infections with her primary care physician over the summer. I am not sure if the patient was symptomatic when the urine was obtained or if the urine was a catheterized specimen. 2 of the urinalysis contained moderate squamous epithelial cells making me concerned for contamination. Please reach out to the patient to see if she is having symptoms of an UTI at this time.  If she is, we will need a CATH UA for microscopic urinalysis and culture.  If she is not symptomatic at this time, please ask her to contact our office to manage her UTI's.

## 2016-04-04 ENCOUNTER — Inpatient Hospital Stay (HOSPITAL_COMMUNITY): Payer: Medicare Other

## 2016-04-04 ENCOUNTER — Inpatient Hospital Stay (HOSPITAL_COMMUNITY): Payer: Medicare Other | Admitting: Critical Care Medicine

## 2016-04-04 ENCOUNTER — Inpatient Hospital Stay (HOSPITAL_COMMUNITY)
Admission: RE | Admit: 2016-04-04 | Discharge: 2016-04-09 | DRG: 472 | Disposition: A | Discharge status: Skilled Nursing Facility | Payer: Medicare Other | Source: Ambulatory Visit | Attending: Neurosurgery | Admitting: Neurosurgery

## 2016-04-04 ENCOUNTER — Encounter (HOSPITAL_COMMUNITY): Payer: Self-pay | Admitting: *Deleted

## 2016-04-04 ENCOUNTER — Encounter (HOSPITAL_COMMUNITY)
Admission: RE | Disposition: A | Discharge status: Skilled Nursing Facility | Payer: Self-pay | Source: Ambulatory Visit | Attending: Neurosurgery

## 2016-04-04 DIAGNOSIS — M069 Rheumatoid arthritis, unspecified: Secondary | ICD-10-CM | POA: Diagnosis present

## 2016-04-04 DIAGNOSIS — Z7982 Long term (current) use of aspirin: Secondary | ICD-10-CM | POA: Diagnosis not present

## 2016-04-04 DIAGNOSIS — H3530 Unspecified macular degeneration: Secondary | ICD-10-CM | POA: Diagnosis present

## 2016-04-04 DIAGNOSIS — Z9109 Other allergy status, other than to drugs and biological substances: Secondary | ICD-10-CM

## 2016-04-04 DIAGNOSIS — Z882 Allergy status to sulfonamides status: Secondary | ICD-10-CM | POA: Diagnosis not present

## 2016-04-04 DIAGNOSIS — R410 Disorientation, unspecified: Secondary | ICD-10-CM | POA: Diagnosis present

## 2016-04-04 DIAGNOSIS — H9190 Unspecified hearing loss, unspecified ear: Secondary | ICD-10-CM | POA: Diagnosis present

## 2016-04-04 DIAGNOSIS — I10 Essential (primary) hypertension: Secondary | ICD-10-CM | POA: Diagnosis present

## 2016-04-04 DIAGNOSIS — Z853 Personal history of malignant neoplasm of breast: Secondary | ICD-10-CM

## 2016-04-04 DIAGNOSIS — M50122 Cervical disc disorder at C5-C6 level with radiculopathy: Principal | ICD-10-CM | POA: Diagnosis present

## 2016-04-04 DIAGNOSIS — Z880 Allergy status to penicillin: Secondary | ICD-10-CM

## 2016-04-04 DIAGNOSIS — Z79899 Other long term (current) drug therapy: Secondary | ICD-10-CM | POA: Diagnosis not present

## 2016-04-04 DIAGNOSIS — M79602 Pain in left arm: Secondary | ICD-10-CM | POA: Diagnosis present

## 2016-04-04 DIAGNOSIS — F419 Anxiety disorder, unspecified: Secondary | ICD-10-CM | POA: Diagnosis present

## 2016-04-04 DIAGNOSIS — M109 Gout, unspecified: Secondary | ICD-10-CM | POA: Diagnosis present

## 2016-04-04 DIAGNOSIS — Z8672 Personal history of thrombophlebitis: Secondary | ICD-10-CM | POA: Diagnosis not present

## 2016-04-04 DIAGNOSIS — E785 Hyperlipidemia, unspecified: Secondary | ICD-10-CM | POA: Diagnosis present

## 2016-04-04 DIAGNOSIS — M50222 Other cervical disc displacement at C5-C6 level: Secondary | ICD-10-CM | POA: Diagnosis present

## 2016-04-04 DIAGNOSIS — M4712 Other spondylosis with myelopathy, cervical region: Secondary | ICD-10-CM | POA: Diagnosis present

## 2016-04-04 DIAGNOSIS — R200 Anesthesia of skin: Secondary | ICD-10-CM | POA: Diagnosis present

## 2016-04-04 DIAGNOSIS — E119 Type 2 diabetes mellitus without complications: Secondary | ICD-10-CM | POA: Diagnosis present

## 2016-04-04 DIAGNOSIS — Z800 Family history of malignant neoplasm of digestive organs: Secondary | ICD-10-CM | POA: Diagnosis not present

## 2016-04-04 DIAGNOSIS — Z419 Encounter for procedure for purposes other than remedying health state, unspecified: Secondary | ICD-10-CM

## 2016-04-04 DIAGNOSIS — Z8041 Family history of malignant neoplasm of ovary: Secondary | ICD-10-CM

## 2016-04-04 DIAGNOSIS — Z825 Family history of asthma and other chronic lower respiratory diseases: Secondary | ICD-10-CM

## 2016-04-04 DIAGNOSIS — F329 Major depressive disorder, single episode, unspecified: Secondary | ICD-10-CM | POA: Diagnosis present

## 2016-04-04 DIAGNOSIS — M4722 Other spondylosis with radiculopathy, cervical region: Secondary | ICD-10-CM | POA: Diagnosis present

## 2016-04-04 DIAGNOSIS — R531 Weakness: Secondary | ICD-10-CM | POA: Diagnosis present

## 2016-04-04 DIAGNOSIS — Z7984 Long term (current) use of oral hypoglycemic drugs: Secondary | ICD-10-CM | POA: Diagnosis not present

## 2016-04-04 DIAGNOSIS — Z8371 Family history of colonic polyps: Secondary | ICD-10-CM

## 2016-04-04 HISTORY — PX: ANTERIOR CERVICAL DECOMP/DISCECTOMY FUSION: SHX1161

## 2016-04-04 LAB — GLUCOSE, CAPILLARY
GLUCOSE-CAPILLARY: 140 mg/dL — AB (ref 65–99)
Glucose-Capillary: 144 mg/dL — ABNORMAL HIGH (ref 65–99)
Glucose-Capillary: 135 mg/dL — ABNORMAL HIGH (ref 65–99)

## 2016-04-04 SURGERY — ANTERIOR CERVICAL DECOMPRESSION/DISCECTOMY FUSION 3 LEVELS
Anesthesia: General

## 2016-04-04 MED ORDER — MORPHINE SULFATE (PF) 2 MG/ML IV SOLN: 1.0000 mg | INTRAVENOUS | Status: DC | PRN

## 2016-04-04 MED ORDER — THROMBIN 20000 UNITS EX SOLR
CUTANEOUS | Status: AC
  Filled 2016-04-04: qty 20000

## 2016-04-04 MED ORDER — INSULIN ASPART 100 UNIT/ML ~~LOC~~ SOLN
0.0000 [IU] | SUBCUTANEOUS | Status: DC
  Administered 2016-04-04: 3 [IU] via SUBCUTANEOUS
  Administered 2016-04-05 (×3): 4 [IU] via SUBCUTANEOUS
  Administered 2016-04-05 (×2): 7 [IU] via SUBCUTANEOUS
  Administered 2016-04-05 – 2016-04-06 (×2): 4 [IU] via SUBCUTANEOUS
  Administered 2016-04-06: 3 [IU] via SUBCUTANEOUS
  Administered 2016-04-06: 4 [IU] via SUBCUTANEOUS

## 2016-04-04 MED ORDER — HYDROCODONE-ACETAMINOPHEN 5-325 MG PO TABS
1.0000 | ORAL_TABLET | ORAL | Status: DC | PRN
  Administered 2016-04-07 (×2): 2 via ORAL
  Administered 2016-04-07: 1 via ORAL
  Administered 2016-04-08: 2 via ORAL
  Administered 2016-04-09: 1 via ORAL
  Filled 2016-04-04 (×4): qty 2
  Filled 2016-04-04: qty 1

## 2016-04-04 MED ORDER — VANCOMYCIN HCL IN DEXTROSE 1-5 GM/200ML-% IV SOLN
1000.0000 mg | INTRAVENOUS | Status: AC
  Administered 2016-04-04: 1000 mg via INTRAVENOUS
  Filled 2016-04-04: qty 200

## 2016-04-04 MED ORDER — DEXAMETHASONE 4 MG PO TABS
4.0000 mg | ORAL_TABLET | Freq: Four times a day (QID) | ORAL | Status: AC
  Administered 2016-04-04 – 2016-04-05 (×3): 4 mg via ORAL
  Filled 2016-04-04 (×3): qty 1

## 2016-04-04 MED ORDER — DOCUSATE SODIUM 100 MG PO CAPS
100.0000 mg | ORAL_CAPSULE | Freq: Two times a day (BID) | ORAL | Status: DC
  Administered 2016-04-04 – 2016-04-09 (×10): 100 mg via ORAL
  Filled 2016-04-04 (×10): qty 1

## 2016-04-04 MED ORDER — FENTANYL CITRATE (PF) 100 MCG/2ML IJ SOLN
INTRAMUSCULAR | Status: AC
  Filled 2016-04-04: qty 2

## 2016-04-04 MED ORDER — PHENYLEPHRINE 40 MCG/ML (10ML) SYRINGE FOR IV PUSH (FOR BLOOD PRESSURE SUPPORT)
PREFILLED_SYRINGE | INTRAVENOUS | Status: AC
  Filled 2016-04-04: qty 10

## 2016-04-04 MED ORDER — FENTANYL CITRATE (PF) 100 MCG/2ML IJ SOLN
INTRAMUSCULAR | Status: AC
  Filled 2016-04-04: qty 4

## 2016-04-04 MED ORDER — LABETALOL HCL 5 MG/ML IV SOLN
INTRAVENOUS | Status: AC
  Filled 2016-04-04: qty 4

## 2016-04-04 MED ORDER — HYDROMORPHONE HCL 1 MG/ML IJ SOLN
INTRAMUSCULAR | Status: AC
  Filled 2016-04-04: qty 1

## 2016-04-04 MED ORDER — CARVEDILOL 6.25 MG PO TABS
6.2500 mg | ORAL_TABLET | Freq: Two times a day (BID) | ORAL | Status: DC
  Administered 2016-04-05 – 2016-04-09 (×9): 6.25 mg via ORAL
  Filled 2016-04-04 (×9): qty 1

## 2016-04-04 MED ORDER — PHENOL 1.4 % MT LIQD: 1.0000 | OROMUCOSAL | Status: DC | PRN

## 2016-04-04 MED ORDER — ADULT MULTIVITAMIN W/MINERALS CH
1.0000 | ORAL_TABLET | Freq: Every day | ORAL | Status: DC
  Administered 2016-04-04 – 2016-04-09 (×6): 1 via ORAL
  Filled 2016-04-04 (×6): qty 1

## 2016-04-04 MED ORDER — LABETALOL HCL 5 MG/ML IV SOLN
5.0000 mg | Freq: Once | INTRAVENOUS | Status: AC
  Administered 2016-04-04: 5 mg via INTRAVENOUS

## 2016-04-04 MED ORDER — DIAZEPAM 5 MG PO TABS
5.0000 mg | ORAL_TABLET | Freq: Four times a day (QID) | ORAL | Status: DC | PRN
  Administered 2016-04-06 – 2016-04-07 (×2): 5 mg via ORAL
  Filled 2016-04-04 (×2): qty 1

## 2016-04-04 MED ORDER — FOLIC ACID 400 MCG PO TABS: 400.0000 ug | ORAL_TABLET | Freq: Every day | ORAL | Status: DC

## 2016-04-04 MED ORDER — ALUM & MAG HYDROXIDE-SIMETH 200-200-20 MG/5ML PO SUSP: 30.0000 mL | Freq: Four times a day (QID) | ORAL | Status: DC | PRN

## 2016-04-04 MED ORDER — MENTHOL 3 MG MT LOZG: 1.0000 | LOZENGE | OROMUCOSAL | Status: DC | PRN

## 2016-04-04 MED ORDER — BUPIVACAINE-EPINEPHRINE (PF) 0.5% -1:200000 IJ SOLN
INTRAMUSCULAR | Status: AC
  Filled 2016-04-04: qty 30

## 2016-04-04 MED ORDER — LOSARTAN POTASSIUM 50 MG PO TABS
100.0000 mg | ORAL_TABLET | Freq: Every day | ORAL | Status: DC
  Administered 2016-04-04 – 2016-04-09 (×6): 100 mg via ORAL
  Filled 2016-04-04 (×6): qty 2

## 2016-04-04 MED ORDER — BISACODYL 10 MG RE SUPP: 10.0000 mg | Freq: Every day | RECTAL | Status: DC | PRN

## 2016-04-04 MED ORDER — ACETAMINOPHEN 325 MG PO TABS
650.0000 mg | ORAL_TABLET | ORAL | Status: DC | PRN
  Administered 2016-04-08: 650 mg via ORAL
  Filled 2016-04-04: qty 2

## 2016-04-04 MED ORDER — SUGAMMADEX SODIUM 200 MG/2ML IV SOLN
INTRAVENOUS | Status: DC | PRN
  Administered 2016-04-04: 150 mg via INTRAVENOUS

## 2016-04-04 MED ORDER — ONDANSETRON HCL 4 MG/2ML IJ SOLN
INTRAMUSCULAR | Status: AC
  Filled 2016-04-04: qty 2

## 2016-04-04 MED ORDER — ROCURONIUM BROMIDE 100 MG/10ML IV SOLN
INTRAVENOUS | Status: DC | PRN
  Administered 2016-04-04: 60 mg via INTRAVENOUS

## 2016-04-04 MED ORDER — BACITRACIN ZINC 500 UNIT/GM EX OINT
TOPICAL_OINTMENT | CUTANEOUS | Status: AC
  Filled 2016-04-04: qty 28.35

## 2016-04-04 MED ORDER — LACTATED RINGERS IV SOLN
INTRAVENOUS | Status: DC
  Administered 2016-04-04 (×2): via INTRAVENOUS

## 2016-04-04 MED ORDER — FOLIC ACID 1 MG PO TABS
1.0000 mg | ORAL_TABLET | Freq: Every day | ORAL | Status: DC
  Administered 2016-04-04 – 2016-04-09 (×6): 1 mg via ORAL
  Filled 2016-04-04 (×6): qty 1

## 2016-04-04 MED ORDER — PAROXETINE HCL 20 MG PO TABS
30.0000 mg | ORAL_TABLET | Freq: Every day | ORAL | Status: DC
  Administered 2016-04-04 – 2016-04-05 (×2): 30 mg via ORAL
  Filled 2016-04-04 (×3): qty 2

## 2016-04-04 MED ORDER — OXYCODONE-ACETAMINOPHEN 5-325 MG PO TABS
1.0000 | ORAL_TABLET | ORAL | Status: DC | PRN
  Administered 2016-04-04 – 2016-04-07 (×8): 1 via ORAL
  Administered 2016-04-07 – 2016-04-08 (×2): 2 via ORAL
  Filled 2016-04-04 (×2): qty 1
  Filled 2016-04-04: qty 2
  Filled 2016-04-04: qty 1
  Filled 2016-04-04: qty 2
  Filled 2016-04-04 (×2): qty 1
  Filled 2016-04-04 (×2): qty 2
  Filled 2016-04-04: qty 1

## 2016-04-04 MED ORDER — PROMETHAZINE HCL 25 MG/ML IJ SOLN: 6.2500 mg | INTRAMUSCULAR | Status: DC | PRN

## 2016-04-04 MED ORDER — PRAVASTATIN SODIUM 40 MG PO TABS
40.0000 mg | ORAL_TABLET | Freq: Every day | ORAL | Status: DC
  Administered 2016-04-05 – 2016-04-09 (×5): 40 mg via ORAL
  Filled 2016-04-04 (×5): qty 1

## 2016-04-04 MED ORDER — LACTATED RINGERS IV SOLN
INTRAVENOUS | Status: DC
  Administered 2016-04-05: 02:00:00 via INTRAVENOUS

## 2016-04-04 MED ORDER — ONDANSETRON HCL 4 MG/2ML IJ SOLN: 4.0000 mg | INTRAMUSCULAR | Status: DC | PRN

## 2016-04-04 MED ORDER — ALLOPURINOL 100 MG PO TABS
300.0000 mg | ORAL_TABLET | Freq: Every day | ORAL | Status: DC
  Administered 2016-04-04 – 2016-04-09 (×6): 300 mg via ORAL
  Filled 2016-04-04 (×6): qty 3

## 2016-04-04 MED ORDER — METFORMIN HCL 500 MG PO TABS
500.0000 mg | ORAL_TABLET | Freq: Two times a day (BID) | ORAL | Status: DC
  Administered 2016-04-05 – 2016-04-09 (×10): 500 mg via ORAL
  Filled 2016-04-04 (×10): qty 1

## 2016-04-04 MED ORDER — VANCOMYCIN HCL IN DEXTROSE 750-5 MG/150ML-% IV SOLN
750.0000 mg | Freq: Once | INTRAVENOUS | Status: AC
  Administered 2016-04-05: 750 mg via INTRAVENOUS
  Filled 2016-04-04: qty 150

## 2016-04-04 MED ORDER — PROPOFOL 10 MG/ML IV BOLUS
INTRAVENOUS | Status: AC
  Filled 2016-04-04: qty 20

## 2016-04-04 MED ORDER — PHENYLEPHRINE HCL 10 MG/ML IJ SOLN
INTRAVENOUS | Status: DC | PRN
  Administered 2016-04-04: 30 ug/min via INTRAVENOUS

## 2016-04-04 MED ORDER — PROPOFOL 10 MG/ML IV BOLUS
INTRAVENOUS | Status: DC | PRN
  Administered 2016-04-04: 120 mg via INTRAVENOUS

## 2016-04-04 MED ORDER — PHENYLEPHRINE HCL 10 MG/ML IJ SOLN
INTRAMUSCULAR | Status: DC | PRN
Start: 2016-04-04 — End: 2016-04-04
  Administered 2016-04-04 (×3): 80 ug via INTRAVENOUS

## 2016-04-04 MED ORDER — THROMBIN 5000 UNITS EX SOLR
CUTANEOUS | Status: AC
  Filled 2016-04-04: qty 5000

## 2016-04-04 MED ORDER — 0.9 % SODIUM CHLORIDE (POUR BTL) OPTIME
TOPICAL | Status: DC | PRN
  Administered 2016-04-04: 1000 mL

## 2016-04-04 MED ORDER — ALBUTEROL SULFATE (2.5 MG/3ML) 0.083% IN NEBU: 3.0000 mL | INHALATION_SOLUTION | Freq: Four times a day (QID) | RESPIRATORY_TRACT | Status: DC | PRN

## 2016-04-04 MED ORDER — SUGAMMADEX SODIUM 200 MG/2ML IV SOLN
INTRAVENOUS | Status: AC
  Filled 2016-04-04: qty 2

## 2016-04-04 MED ORDER — SODIUM CHLORIDE 0.9 % IR SOLN
Status: DC | PRN
  Administered 2016-04-04: 13:00:00

## 2016-04-04 MED ORDER — HYDROMORPHONE HCL 1 MG/ML IJ SOLN
0.2500 mg | INTRAMUSCULAR | Status: DC | PRN
  Administered 2016-04-04 (×2): 0.5 mg via INTRAVENOUS

## 2016-04-04 MED ORDER — LIDOCAINE HCL (CARDIAC) 20 MG/ML IV SOLN
INTRAVENOUS | Status: DC | PRN
  Administered 2016-04-04: 100 mg via INTRAVENOUS

## 2016-04-04 MED ORDER — ACETAMINOPHEN 650 MG RE SUPP: 650.0000 mg | RECTAL | Status: DC | PRN

## 2016-04-04 MED ORDER — THROMBIN 20000 UNITS EX SOLR
CUTANEOUS | Status: DC | PRN
  Administered 2016-04-04: 13:00:00 via TOPICAL

## 2016-04-04 MED ORDER — DEXAMETHASONE SODIUM PHOSPHATE 4 MG/ML IJ SOLN: 4.0000 mg | Freq: Four times a day (QID) | INTRAMUSCULAR | Status: AC

## 2016-04-04 MED ORDER — THROMBIN 5000 UNITS EX SOLR
CUTANEOUS | Status: DC | PRN
  Administered 2016-04-04: 13:00:00 via TOPICAL

## 2016-04-04 MED ORDER — ONDANSETRON HCL 4 MG/2ML IJ SOLN
INTRAMUSCULAR | Status: DC | PRN
  Administered 2016-04-04: 4 mg via INTRAVENOUS

## 2016-04-04 MED ORDER — FENTANYL CITRATE (PF) 100 MCG/2ML IJ SOLN
INTRAMUSCULAR | Status: DC | PRN
  Administered 2016-04-04 (×3): 50 ug via INTRAVENOUS
  Administered 2016-04-04: 100 ug via INTRAVENOUS
  Administered 2016-04-04: 50 ug via INTRAVENOUS

## 2016-04-04 SURGICAL SUPPLY — 62 items
BAG DECANTER FOR FLEXI CONT (MISCELLANEOUS) ×3 IMPLANT
BENZOIN TINCTURE PRP APPL 2/3 (GAUZE/BANDAGES/DRESSINGS) ×3 IMPLANT
BIT DRILL NEURO 2X3.1 SFT TUCH (MISCELLANEOUS) ×1 IMPLANT
BLADE SURG 15 STRL LF DISP TIS (BLADE) ×1 IMPLANT
BLADE SURG 15 STRL SS (BLADE) ×2
BLADE ULTRA TIP 2M (BLADE) ×3 IMPLANT
BUR BARREL STRAIGHT FLUTE 4.0 (BURR) ×3 IMPLANT
BUR MATCHSTICK NEURO 3.0 LAGG (BURR) ×3 IMPLANT
CANISTER SUCT 3000ML PPV (MISCELLANEOUS) ×3 IMPLANT
CLIP TI MEDIUM 6 (CLIP) ×3 IMPLANT
CLOSURE WOUND 1/2 X4 (GAUZE/BANDAGES/DRESSINGS) ×1
COVER MAYO STAND STRL (DRAPES) ×3 IMPLANT
DRAPE LAPAROTOMY 100X72 PEDS (DRAPES) ×3 IMPLANT
DRAPE MICROSCOPE LEICA (MISCELLANEOUS) ×3 IMPLANT
DRAPE POUCH INSTRU U-SHP 10X18 (DRAPES) ×3 IMPLANT
DRAPE SURG 17X23 STRL (DRAPES) ×6 IMPLANT
DRILL NEURO 2X3.1 SOFT TOUCH (MISCELLANEOUS) ×3
ELECT REM PT RETURN 9FT ADLT (ELECTROSURGICAL) ×3
ELECTRODE REM PT RTRN 9FT ADLT (ELECTROSURGICAL) ×1 IMPLANT
GAUZE SPONGE 4X4 12PLY STRL (GAUZE/BANDAGES/DRESSINGS) ×3 IMPLANT
GAUZE SPONGE 4X4 16PLY XRAY LF (GAUZE/BANDAGES/DRESSINGS) IMPLANT
GLOVE BIO SURGEON STRL SZ8 (GLOVE) ×6 IMPLANT
GLOVE BIO SURGEON STRL SZ8.5 (GLOVE) ×3 IMPLANT
GLOVE BIOGEL PI IND STRL 7.0 (GLOVE) ×2 IMPLANT
GLOVE BIOGEL PI IND STRL 7.5 (GLOVE) ×1 IMPLANT
GLOVE BIOGEL PI INDICATOR 7.0 (GLOVE) ×4
GLOVE BIOGEL PI INDICATOR 7.5 (GLOVE) ×2
GLOVE EXAM NITRILE LRG STRL (GLOVE) IMPLANT
GLOVE EXAM NITRILE XL STR (GLOVE) IMPLANT
GLOVE EXAM NITRILE XS STR PU (GLOVE) IMPLANT
GLOVE INDICATOR 8.5 STRL (GLOVE) ×3 IMPLANT
GOWN STRL REUS W/ TWL LRG LVL3 (GOWN DISPOSABLE) IMPLANT
GOWN STRL REUS W/ TWL XL LVL3 (GOWN DISPOSABLE) ×4 IMPLANT
GOWN STRL REUS W/TWL LRG LVL3 (GOWN DISPOSABLE)
GOWN STRL REUS W/TWL XL LVL3 (GOWN DISPOSABLE) ×8
HEMOSTAT POWDER KIT SURGIFOAM (HEMOSTASIS) ×3 IMPLANT
KIT BASIN OR (CUSTOM PROCEDURE TRAY) ×3 IMPLANT
KIT ROOM TURNOVER OR (KITS) ×3 IMPLANT
MARKER SKIN DUAL TIP RULER LAB (MISCELLANEOUS) ×3 IMPLANT
NEEDLE HYPO 22GX1.5 SAFETY (NEEDLE) ×3 IMPLANT
NEEDLE SPNL 18GX3.5 QUINCKE PK (NEEDLE) ×3 IMPLANT
NS IRRIG 1000ML POUR BTL (IV SOLUTION) ×3 IMPLANT
PACK LAMINECTOMY NEURO (CUSTOM PROCEDURE TRAY) ×3 IMPLANT
PATTIES SURGICAL .5 X.5 (GAUZE/BANDAGES/DRESSINGS) ×6 IMPLANT
PATTIES SURGICAL 1X1 (DISPOSABLE) ×3 IMPLANT
PEEK OPTIMA VISTA-S 11X14X5MM (Peek) ×6 IMPLANT
PEEK S VISTA 7X11X14 (Peek) ×3 IMPLANT
PIN DISTRACTION 14MM (PIN) ×6 IMPLANT
PLATE ANT CERV XTEND 3 LV 42 (Plate) ×3 IMPLANT
PUTTY KINEX BIOACTIVE 5CC (Bone Implant) ×3 IMPLANT
RUBBERBAND STERILE (MISCELLANEOUS) IMPLANT
SCREW XTD VAR 4.2 SELF TAP 12 (Screw) ×24 IMPLANT
SPONGE INTESTINAL PEANUT (DISPOSABLE) ×6 IMPLANT
SPONGE SURGIFOAM ABS GEL 100 (HEMOSTASIS) ×3 IMPLANT
STRIP CLOSURE SKIN 1/2X4 (GAUZE/BANDAGES/DRESSINGS) ×2 IMPLANT
SUT VIC AB 0 CT1 27 (SUTURE) ×2
SUT VIC AB 0 CT1 27XBRD ANTBC (SUTURE) ×1 IMPLANT
SUT VIC AB 3-0 SH 8-18 (SUTURE) ×6 IMPLANT
TAPE CLOTH SURG 4X10 WHT LF (GAUZE/BANDAGES/DRESSINGS) ×3 IMPLANT
TOWEL OR 17X24 6PK STRL BLUE (TOWEL DISPOSABLE) ×3 IMPLANT
TOWEL OR 17X26 10 PK STRL BLUE (TOWEL DISPOSABLE) ×3 IMPLANT
WATER STERILE IRR 1000ML POUR (IV SOLUTION) ×3 IMPLANT

## 2016-04-04 NOTE — Progress Notes (Signed)
Patient ID: ROVENIA MINERVINI, female   DOB: [birth year], 82 y.o.   MRN: [id number] Subjective:  The patient is somnolent but easily arousable. She is in no apparent distress.  Objective: Vital signs in last 24 hours: Temp:  [97.2 F (36.2 C)-98.2 F (36.8 C)] 97.2 F (36.2 C) (10/04 1645) Pulse Rate:  [64-65] 65 (10/04 1645) Resp:  [18] 18 (10/04 1645) BP: (167-203)/(85-86) 203/86 (10/04 1645) SpO2:  [92 %-99 %] 92 % (10/04 1645) Weight:  [71.2 kg (157 lb)] 71.2 kg (157 lb) (10/04 1219)  Intake/Output from previous day: No intake/output data recorded. Intake/Output this shift: Total I/O In: 1500 [I.V.:1500] Out: 200 [Blood:200]  Physical exam the patient is somnolent but arousable. She is moving all 4 extremities well.  Her dressing is soft, clean and dry. There is no evidence of hematoma or shift.  Lab Results: No results for input(s): WBC, HGB, HCT, PLT in the last 72 hours. BMET No results for input(s): NA, K, CL, CO2, GLUCOSE, BUN, CREATININE, CALCIUM in the last 72 hours.  Studies/Results: Dg Cervical Spine 2-3 Views  Result Date: 04/04/2016 CLINICAL DATA:  82 year old female with a history of intraoperative anterior cervical discectomy infusion EXAM: CERVICAL SPINE - 2-3 VIEW COMPARISON:  Plain film 02/24/2016 FINDINGS: Intraoperative cross-table lateral images of the cervical spine. Initial image labeled 1 demonstrates surgical probe identifying the disc space of C3-C4. The second image labeled 2 demonstrates surgical packing in the soft tissues anterior to the cervical spine. Endotracheal tube partially visualized. IMPRESSION: Limited intraoperative cross-table lateral images of the cervical spine, with identification of the C3-C4 level with surgical probe, as above. Signed, Dulcy Fanny. Earleen Newport, DO [facility] Electronically Signed   By: Corrie Mckusick D.O.   On: 04/04/2016 16:33    Assessment/Plan: The patient is doing  well. I spoke with her family. Dr. Saintclair Halsted is going to see the patient in my absence.  LOS: 0 days     Daril Warga D 04/04/2016, 5:08 PM

## 2016-04-04 NOTE — Op Note (Signed)
Brief history: The patient is an 82 year old white female who has complained of neck and left arm pain consistent with a cervical radiculopathy. She has failed medical management and was worked up with a cervical MRI. This demonstrated spondylosis and herniated disc at C5-6, C6-7 and C7-T1. I discussed the various treatment options with the patient. She has decided to proceed with surgery after weighing the risks, benefits, and alternatives.  Preoperative diagnosis: C5-6, C6-7 and C7-T1 spondylosis, herniated disc, cervical radiculopathy, cervicalgia  Postoperative diagnosis: The same  Procedure: C5-6, C6-7 and C7-T1 Anterior cervical discectomy/decompression; C5-6, C6-7 and C7-T1 interbody arthrodesis with local morcellized autograft bone and Kinnex bone graft extender; insertion of interbody prosthesis at C5-6, C6-7 and C7-T1 (Zimmer peek interbody prosthesis); anterior cervical plating from C5-T1 with globus titanium plate  Surgeon: Dr. Earle Gell  Asst.: Dr. Kary Kos  Anesthesia: Gen. endotracheal  Estimated blood loss: 150 mL  Drains: None  Complications: None  Description of procedure: The patient was brought to the operating room by the anesthesia team. General endotracheal anesthesia was induced. A roll was placed under the patient's shoulders to keep the neck in the neutral position. The patient's anterior cervical region was then prepared with Betadine scrub and Betadine solution. Sterile drapes were applied.  The area to be incised was then injected with Marcaine with epinephrine solution. I then used a scalpel to make a transverse incision in the patient's left anterior neck. I used the Metzenbaum scissors to divide the platysmal muscle and then to dissect medial to the sternocleidomastoid muscle, jugular vein, and carotid artery. I carefully dissected down towards the anterior cervical spine identifying the esophagus and retracting it medially. Then using Kitner swabs to clear  soft tissue from the anterior cervical spine. We then inserted a bent spinal needle into the upper exposed intervertebral disc space. We then obtained intraoperative radiographs confirm our location.  I then used electrocautery to detach the medial border of the longus colli muscle bilaterally from the C5-6, C6-7 and C7-T1 intervertebral disc spaces. I then inserted the Caspar self-retaining retractor underneath the longus colli muscle bilaterally to provide exposure.  We then incised the intervertebral disc at C5-6. We then performed a partial intervertebral discectomy with a pituitary forceps and the Karlin curettes. I then inserted distraction screws into the vertebral bodies at C5-6. We then distracted the interspace. We then used the high-speed drill to decorticate the vertebral endplates at [id number]-GRM, to drill away the remainder of the intervertebral disc, to drill away some posterior spondylosis, and to thin out the posterior longitudinal ligament. I then incised ligament with the arachnoid knife. We then removed the ligament with a Kerrison punches undercutting the vertebral endplates and decompressing the thecal sac. We then performed foraminotomies about the bilateral C6 nerve roots. This completed the decompression at this level.  We then repeated this procedure and analogous fashion at C6-7 and C7-T1 decompressing the thecal sac and the bilateral C7 and T1 nerve roots. We did encounter a large herniated disc at C7-T1 on the right. We removed with the Kerrison punches and pituitary forceps.  We now turned our to attention to the interbody fusion. We used the trial spacers to determine the appropriate size for the interbody prosthesis. We then pre-filled prosthesis with a combination of local morcellized autograft bone that we obtained during decompression as well as Kinnex bone graft extender. We then inserted the prosthesis into the distracted interspace at C5-6, C6-7 and C7-T1. We then removed the  distraction screws. There was  a good snug fit of the prosthesis in the interspace.  Having completed the fusion we now turned attention to the anterior spinal instrumentation. We used the high-speed drill to drill away some anterior spondylosis at the disc spaces so that the plate lay down flat. We selected the appropriate length titanium anterior cervical plate. We laid it along the anterior aspect of the vertebral bodies from C5-C7. We then drilled 12 mm holes at C5, C6, C7 and T1. We then secured the plate to the vertebral bodies by placing two 12 mm self-tapping screws at C5, C6, C7 and T1. We then obtained intraoperative radiograph. The demonstrating good position of the instrumentation. We therefore secured the screws the plate the locking each cam. This completed the instrumentation.  We then obtained hemostasis using bipolar electrocautery. We irrigated the wound out with bacitracin solution. We then removed the retractor. We inspected the esophagus for any damage. There was none apparent. We then reapproximated patient's platysmal muscle with interrupted 3-0 Vicryl suture. We then reapproximated the subcutaneous tissue with interrupted 3-0 Vicryl suture. The skin was reapproximated with Steri-Strips and benzoin. The wound was then covered with bacitracin ointment. A sterile dressing was applied. The drapes were removed. Patient was subsequently extubated by the anesthesia team and transported to the post anesthesia care unit in stable condition. All sponge instrument and needle counts were reportedly correct at the end of this case.

## 2016-04-04 NOTE — Anesthesia Procedure Notes (Addendum)
Procedure Name: Intubation Date/Time: 04/04/2016 1:08 PM Performed by: Lance Coon Pre-anesthesia Checklist: Emergency Drugs available, Patient identified, Suction available, Patient being monitored and Timeout performed Patient Re-evaluated:Patient Re-evaluated prior to inductionOxygen Delivery Method: Circle system utilized Preoxygenation: Pre-oxygenation with 100% oxygen Intubation Type: IV induction Ventilation: Mask ventilation without difficulty Laryngoscope Size: Glidescope and 3 Grade View: Grade I Tube type: Oral Tube size: 7.0 mm Number of attempts: 1 Airway Equipment and Method: Stylet and Video-laryngoscopy Placement Confirmation: ETT inserted through vocal cords under direct vision,  positive ETCO2 and breath sounds checked- equal and bilateral Secured at: 21 cm Tube secured with: Tape Dental Injury: Teeth and Oropharynx as per pre-operative assessment

## 2016-04-04 NOTE — H&P (Signed)
Subjective: The patient is an 82 year old white female who S complained of left neck and arm pain with weakness in her hand. She has failed medical management and was worked up with a lumbar MRI. This demonstrated she had her herniated disc with foraminal stenosis at C5-6, C6-7 and C7-T1. I discussed the various treatment options with the patient including surgery. She has weighed the risks, benefits, and alternatives surgery and decided to proceed with a C5-6, C6-7 and C7-T1 anterior cervical discectomy, fusion, and plating.   Past Medical History:  Diagnosis Date  . Anxiety   . Breast cancer ([place]) 2015   right- radiation  . Cough   . Depression   . Diabetes mellitus without complication ([place])   . Gout   . HLD (hyperlipidemia)   . HTN (hypertension)   . Hypertension   . Interstitial lung disease ([place])   . Lumbar spinal stenosis   . Macular degeneration   . Migraines   . Osteoarthritis   . Phlebitis   . Pulmonary nodule   . Rheumatoid arthritis [facility])     Past Surgical History:  Procedure Laterality Date  . ABDOMINAL HYSTERECTOMY    . BREAST BIOPSY Right 02/21/2015   path pending  . BREAST BIOPSY Right 03/29/2016   path pending  . COLONOSCOPY    . ESOPHAGOGASTRODUODENOSCOPY    . MASTECTOMY, PARTIAL      Allergies  Allergen Reactions  . Lisinopril Cough  . Penicillins Other (See Comments)    Makes pt very "forgetful"  . Clarithromycin Rash  . Sulfa Antibiotics Rash    Social History  Substance Use Topics  . Smoking status: Never Smoker  . Smokeless tobacco: Never Used  . Alcohol use No    Family History  Problem Relation Age of Onset  . Colon cancer Mother   . Stomach cancer Mother   . Esophageal cancer Mother   . COPD Father   . Colon polyps Sister   . Ovarian cancer Sister   . Kidney disease Neg Hx   . Bladder Cancer Neg Hx    Prior to Admission medications   Medication Sig Start Date End Date Taking? Authorizing Provider  Adalimumab (HUMIRA) 40 MG/0.8ML  PSKT Inject 40 mg into the skin every 21 ( twenty-one) days.   Yes Historical Provider, MD  allopurinol (ZYLOPRIM) 300 MG tablet Take 300 mg by mouth daily.   Yes Historical Provider, MD  aspirin EC 325 MG tablet Take 325 mg by mouth daily.    Yes Historical Provider, MD  carvedilol (COREG) 6.25 MG tablet Take 6.25 mg by mouth 2 (two) times daily with a meal.   Yes Historical Provider, MD  Cranberry 400 MG CAPS Take 1 capsule by mouth 2 (two) times daily.   Yes Historical Provider, MD  folic acid (FOLVITE) A999333 MCG tablet Take 400 mcg by mouth daily.   Yes Historical Provider, MD  ibuprofen (ADVIL,MOTRIN) 200 MG tablet Take 400 mg by mouth every 4 (four) hours as needed for mild pain or moderate pain.   Yes Historical Provider, MD  losartan (COZAAR) 100 MG tablet Take 100 mg by mouth daily.   Yes Historical Provider, MD  lovastatin (MEVACOR) 40 MG tablet Take 40 mg by mouth at bedtime.   Yes Historical Provider, MD  metFORMIN (GLUCOPHAGE) 500 MG tablet Take 500 mg by mouth 2 (two) times daily with a meal.   Yes Historical Provider, MD  methotrexate (RHEUMATREX) 2.5 MG tablet Take 15 mg by mouth once a week. Pt  takes on Wednesday.   Caution:Chemotherapy. Protect from light.   Yes Historical Provider, MD  Multiple Vitamin (MULTIVITAMIN WITH MINERALS) TABS tablet Take 1 tablet by mouth daily.   Yes Historical Provider, MD  nitrofurantoin, macrocrystal-monohydrate, (MACROBID) 100 MG capsule Take 100 mg by mouth 2 (two) times daily.   Yes Historical Provider, MD  oxyCODONE-acetaminophen (PERCOCET/ROXICET) 5-325 MG tablet Take 1 tablet by mouth 3 (three) times daily as needed for severe pain.   Yes Historical Provider, MD  PARoxetine (PAXIL) 30 MG tablet Take 30 mg by mouth daily.    Yes Historical Provider, MD  acetaminophen (TYLENOL) 500 MG tablet Take 1,000 mg by mouth every 4 (four) hours as needed for mild pain or headache. Reported on 08/08/2015    Historical Provider, MD  albuterol (PROVENTIL  HFA;VENTOLIN HFA) 108 (90 Base) MCG/ACT inhaler Inhale 2 puffs into the lungs every 6 (six) hours as needed for wheezing or shortness of breath. Reported on 10/04/2015    Historical Provider, MD     Review of Systems  Positive ROS: As above  All other systems have been reviewed and were otherwise negative with the exception of those mentioned in the HPI and as above.  Objective: Vital signs in last 24 hours: Temp:  [98.2 F (36.8 C)] 98.2 F (36.8 C) (10/04 1202) Pulse Rate:  [64] 64 (10/04 1202) Resp:  [18] 18 (10/04 1202) BP: (167)/(85) 167/85 (10/04 1202) SpO2:  [99 %] 99 % (10/04 1202) Weight:  [71.2 kg (157 lb)] 71.2 kg (157 lb) (10/04 1219)  General Appearance: Alert, cooperative, no distress, Head: Normocephalic, without obvious abnormality, atraumatic Eyes: PERRL, conjunctiva/corneas clear, EOM's intact,    Ears: Normal  Throat: Normal  Neck: Supple, symmetrical, trachea midline, no adenopathy; thyroid: No enlargement/tenderness/nodules; no carotid bruit or JVD Back: Symmetric, no curvature, ROM normal, no CVA tenderness Lungs: Clear to auscultation bilaterally, respirations unlabored Heart: Regular rate and rhythm, no murmur, rub or gallop Abdomen: Soft, non-tender,, no masses, no organomegaly Extremities: Extremities normal, atraumatic, no cyanosis or edema Pulses: 2+ and symmetric all extremities Skin: Skin color, texture, turgor normal, no rashes or lesions  NEUROLOGIC:   Mental status: alert and oriented, no aphasia, good attention span, Fund of knowledge/ memory ok Motor Exam - grossly normal except the patient has weakness in her left hand. Sensory Exam - grossly normal except the patient has numbness in the left C8 distribution. Reflexes:  Coordination - grossly normal Gait - grossly normal Balance - grossly normal Cranial Nerves: I: smell Not tested  II: visual acuity  OS: Normal  OD: Normal   II: visual fields Full to confrontation  II: pupils Equal,  round, reactive to light  III,VII: ptosis None  III,IV,VI: extraocular muscles  Full ROM  V: mastication Normal  V: facial light touch sensation  Normal  V,VII: corneal reflex  Present  VII: facial muscle function - upper  Normal  VII: facial muscle function - lower Normal  VIII: hearing Not tested  IX: soft palate elevation  Normal  IX,X: gag reflex Present  XI: trapezius strength  5/5  XI: sternocleidomastoid strength 5/5  XI: neck flexion strength  5/5  XII: tongue strength  Normal    Data Review Lab Results  Component Value Date   WBC 6.6 03/27/2016   HGB 12.3 03/27/2016   HCT 37.9 03/27/2016   MCV 94.3 03/27/2016   PLT 207 03/27/2016   Lab Results  Component Value Date   NA 138 03/27/2016   K 3.6 03/27/2016  CL 102 03/27/2016   CO2 27 03/27/2016   BUN 12 03/27/2016   CREATININE 0.73 03/27/2016   GLUCOSE 131 (H) 03/27/2016   Lab Results  Component Value Date   INR 1.0 02/27/2014    Assessment/Plan: The 58, C6-7 and C7-T1 herniated disc, spondylosis, stenosis, cervical radiculopathy, cervical myelopathy. I have discussed the situation with the patient and reviewed her imaging studies with her. We have discussed the various treatment options including surgery. I have described the surgical treatment option of a C5-6, C6-7 and C7-T1 anterior cervical discectomy, fusion, and plating. I have shown her surgical models. We have discussed the risks, benefits, alternatives, expected postoperative course, and likelihood of achieving our goals with surgery. I have answered all the patient's questions. The patient has decided to proceed with surgery.   Katieann Hungate D 04/04/2016 12:53 PM

## 2016-04-04 NOTE — Transfer of Care (Signed)
Immediate Anesthesia Transfer of Care Note  Patient: [place]  Procedure(s) Performed: Procedure(s): ANTERIOR CERVICAL DECOMPRESSION/DISCECTOMY FUSION, INTERBODY PROSTHESIS,PLATE CERVICAL  FIVE-SIX,CERVICAL SIX-SEVEN,CERVICAL SEVEN-THORACIC ONE (N/A)  Patient Location: PACU  Anesthesia Type:General  Level of Consciousness: awake, alert  and patient cooperative  Airway & Oxygen Therapy: Patient Spontanous Breathing and Patient connected to face mask oxygen  Post-op Assessment: Report given to RN, Post -op Vital signs reviewed and stable and Patient moving all extremities X 4  Post vital signs: Reviewed and stable  Last Vitals:  Vitals:   04/04/16 1202  BP: (!) 167/85  Pulse: 64  Resp: 18  Temp: 36.8 C    Last Pain:  Vitals:   04/04/16 1219  TempSrc:   PainSc: 5       Patients Stated Pain Goal: 3 ([id number])  Complications: No apparent anesthesia complications

## 2016-04-04 NOTE — Anesthesia Preprocedure Evaluation (Signed)
Anesthesia Evaluation  Patient identified by MRN, date of birth, ID band Patient awake    Reviewed: Allergy & Precautions, NPO status , Patient's Chart, lab work & pertinent test results, reviewed documented beta blocker date and time   History of Anesthesia Complications Negative for: history of anesthetic complications  Airway Mallampati: II  TM Distance: >3 FB Neck ROM: Full    Dental  (+) Edentulous Lower, Edentulous Upper, Dental Advisory Given   Pulmonary  Interstitial Lung Disease   Pulmonary exam normal        Cardiovascular hypertension, Pt. on medications and Pt. on home beta blockers Normal cardiovascular exam     Neuro/Psych PSYCHIATRIC DISORDERS Anxiety Depression    GI/Hepatic negative GI ROS, Neg liver ROS,   Endo/Other  diabetes  Renal/GU negative Renal ROS     Musculoskeletal   Abdominal   Peds  Hematology   Anesthesia Other Findings   Reproductive/Obstetrics                             Anesthesia Physical Anesthesia Plan  ASA: III  Anesthesia Plan: General   Post-op Pain Management:    Induction: Intravenous  Airway Management Planned: Oral ETT  Additional Equipment:   Intra-op Plan:   Post-operative Plan: Extubation in OR  Informed Consent: I have reviewed the patients History and Physical, chart, labs and discussed the procedure including the risks, benefits and alternatives for the proposed anesthesia with the patient or authorized representative who has indicated his/her understanding and acceptance.   Dental advisory given  Plan Discussed with: CRNA, Anesthesiologist and Surgeon  Anesthesia Plan Comments:         Anesthesia Quick Evaluation

## 2016-04-04 NOTE — Progress Notes (Signed)
Patient arrived to 5M18. Patient is alert, oriented x3, HOH, gauze dressing with new drainage, aspen brace on and aligned, patient assisted to bathroom with RN assistance, patient is two assist, stand and pivot to bathroom.  Patient and family oriented to room, unit, staff.

## 2016-04-04 NOTE — Anesthesia Postprocedure Evaluation (Signed)
Anesthesia Post Note  Patient: [name]  Procedure(s) Performed: Procedure(s) (LRB): ANTERIOR CERVICAL DECOMPRESSION/DISCECTOMY FUSION, INTERBODY PROSTHESIS,PLATE CERVICAL  FIVE-SIX,CERVICAL SIX-SEVEN,CERVICAL SEVEN-THORACIC ONE (N/A)  Patient location during evaluation: PACU Anesthesia Type: General Level of consciousness: awake and alert Pain management: pain level controlled Vital Signs Assessment: post-procedure vital signs reviewed and stable Respiratory status: spontaneous breathing, nonlabored ventilation, respiratory function stable and patient connected to nasal cannula oxygen Cardiovascular status: blood pressure returned to baseline and stable Postop Assessment: no signs of nausea or vomiting Anesthetic complications: no    Last Vitals:  Vitals:   04/04/16 1819 04/04/16 1845  BP:  (!) 194/79  Pulse: 66 71  Resp: 14 18  Temp:  37.1 C    Last Pain:  Vitals:   04/04/16 1845  TempSrc: Oral  PainSc:                  [name]

## 2016-04-04 NOTE — Progress Notes (Signed)
Ant neck dressing saturated, RN changed dressing with gauze and tape. Steri strips intact with drainage. Small amount of edema noted on left side of incision. Pain medication given with HS meds prior to change. Patient asleep directly after dressing change. Will continue to monitor.

## 2016-04-04 NOTE — Progress Notes (Signed)
Pharmacy Antibiotic Note  [name] is a 82 y.o. female with herniated disc with foraminal stenosis at C5-6, C6-7 and C7-T1 admitted on 04/04/2016 for anterior cervical discectomy, fusion, and plating. Post op, pharmacy has been consulted for IV Vancomycin dosing for surgical prophylaxis.  No drain placed.  Vancomycin x1 dose 12 hours post op She received preop Vancomycin 1g IV x1 @13 :00 today. SCr 0.73, estimated Crcl ~ 50 ml/min  Plan:  Vancomycin 750 mg IV  x1 dose 12 hours post op Pharmacy will sign off.   Height: 5\' 2"  (157.5 cm) Weight: 157 lb (71.2 kg) IBW/kg (Calculated) : 50.1  Temp (24hrs), Avg:97.8 F (36.6 C), Min:97.2 F (36.2 C), Max:98.7 F (37.1 C)  No results for input(s): WBC, CREATININE, LATICACIDVEN, VANCOTROUGH, VANCOPEAK, VANCORANDOM, GENTTROUGH, GENTPEAK, GENTRANDOM, TOBRATROUGH, TOBRAPEAK, TOBRARND, AMIKACINPEAK, AMIKACINTROU, AMIKACIN in the last 168 hours.  Estimated Creatinine Clearance: 50.1 mL/min (by C-G formula based on SCr of 0.73 mg/dL).    Allergies  Allergen Reactions  . Lisinopril Cough  . Penicillins Other (See Comments)    Makes pt very "forgetful"  . Clarithromycin Rash  . Sulfa Antibiotics Rash    Antimicrobials this admission:  Dose adjustments this admission: n/a  Microbiology results: none  Thank you for allowing pharmacy to be a part of this patient's care.  Nicole Cella, RPh Clinical Pharmacist Pager: [phone] 04/04/2016 7:05 PM

## 2016-04-05 ENCOUNTER — Encounter (HOSPITAL_COMMUNITY): Payer: Self-pay | Admitting: *Deleted

## 2016-04-05 LAB — GLUCOSE, CAPILLARY
GLUCOSE-CAPILLARY: 118 mg/dL — AB (ref 65–99)
GLUCOSE-CAPILLARY: 153 mg/dL — AB (ref 65–99)
GLUCOSE-CAPILLARY: 165 mg/dL — AB (ref 65–99)
Glucose-Capillary: 178 mg/dL — ABNORMAL HIGH (ref 65–99)
Glucose-Capillary: 188 mg/dL — ABNORMAL HIGH (ref 65–99)
Glucose-Capillary: 201 mg/dL — ABNORMAL HIGH (ref 65–99)
Glucose-Capillary: 236 mg/dL — ABNORMAL HIGH (ref 65–99)

## 2016-04-05 LAB — URINALYSIS, ROUTINE W REFLEX MICROSCOPIC
BILIRUBIN URINE: NEGATIVE
GLUCOSE, UA: NEGATIVE mg/dL
HGB URINE DIPSTICK: NEGATIVE
KETONES UR: NEGATIVE mg/dL
Leukocytes, UA: NEGATIVE
NITRITE: NEGATIVE
PH: 7 (ref 5.0–8.0)
Protein, ur: NEGATIVE mg/dL
Specific Gravity, Urine: 1.008 (ref 1.005–1.030)

## 2016-04-05 NOTE — Care Management Note (Signed)
Case Management Note  Patient Details  Name: [name] MRN: [id number] Date of Birth: [birth year]  Subjective/Objective:   Pt underwent: ANTERIOR CERVICAL DECOMPRESSION/DISCECTOMY FUSION, INTERBODY PROSTHESIS,PLATE CERVICAL  FIVE-SIX,CERVICAL SIX-SEVEN,CERVICAL SEVEN-THORACIC ONE. She is from home alone.                 Action/Plan: Awaiting PT/OT recommendations. CM following for discharge disposition.   Expected Discharge Date:                  Expected Discharge Plan:     In-House Referral:     Discharge planning Services     Post Acute Care Choice:    Choice offered to:     DME Arranged:    DME Agency:     HH Arranged:    HH Agency:     Status of Service:  In process, will continue to follow  If discussed at Long Length of Stay Meetings, dates discussed:    Additional Comments:  Pollie Friar, RN 04/05/2016, 1:15 PM

## 2016-04-05 NOTE — Progress Notes (Signed)
Patient ID: KHILEE KASIM, female   DOB: [birth year], 82 y.o.   MRN: [id number] The patient seems to be okay but very difficult to communicate with her as she is very hard of hearing.  Moves all extremities well incision clean dry and intact  We'll get physical therapy to mobilize the patient probably will need an additional 24 hours prior to discharge.

## 2016-04-05 NOTE — Evaluation (Signed)
Clinical/Bedside Swallow Evaluation Patient Details  Name: [name] MRN: [id number] Date of Birth: [birth year]  Today's Date: 04/05/2016 Time: SLP Start Time (ACUTE ONLY): 1340 SLP Stop Time (ACUTE ONLY): 1355 SLP Time Calculation (min) (ACUTE ONLY): 15 min  Past Medical History:  Past Medical History:  Diagnosis Date  . Anxiety   . Breast cancer ([place]) 2015   right- radiation  . Cough   . Depression   . Diabetes mellitus without complication ([place])   . Gout   . HLD (hyperlipidemia)   . HTN (hypertension)   . Hypertension   . Interstitial lung disease ([place])   . Lumbar spinal stenosis   . Macular degeneration   . Migraines   . Osteoarthritis   . Phlebitis   . Pulmonary nodule   . Rheumatoid arthritis [facility])    Past Surgical History:  Past Surgical History:  Procedure Laterality Date  . ABDOMINAL HYSTERECTOMY    . ANTERIOR CERVICAL DECOMP/DISCECTOMY FUSION N/A 04/04/2016   Procedure: ANTERIOR CERVICAL DECOMPRESSION/DISCECTOMY FUSION, INTERBODY PROSTHESIS,PLATE CERVICAL  FIVE-SIX,CERVICAL SIX-SEVEN,CERVICAL SEVEN-THORACIC ONE;  Surgeon: Newman Pies, MD;  Location: [place];  Service: Neurosurgery;  Laterality: N/A;  . BREAST BIOPSY Right 02/21/2015   path pending  . BREAST BIOPSY Right 03/29/2016   path pending  . COLONOSCOPY    . ESOPHAGOGASTRODUODENOSCOPY    . MASTECTOMY, PARTIAL     HPI:  The patient is an 82 year old white female s/p C5-6, C6-7 and C7-T1 anterior cervical discectomy, fusion, and plating 10/4.     Assessment / Plan / Recommendation Clinical Impression  Patient presents with evidence of decreased airway protection with thin liquids via straw only, eliminated with instruction for small single cup sips, although family endorses coughign across consistencies and bolus presentations prior to admission. No new changes in function noted. Suspect baseline dysphagia with potential post surgical exacerbation. Will f/u with MBS 10/6.     Aspiration Risk  Moderate aspiration risk    Diet Recommendation Regular;Thin liquid   Liquid Administration via: Cup;No straw Medication Administration: Whole meds with puree Supervision: Patient able to self feed;Full supervision/cueing for compensatory strategies Compensations: Slow rate;Small sips/bites Postural Changes: Seated upright at 90 degrees    Other  Recommendations Oral Care Recommendations: Oral care BID   Follow up Recommendations  (TBD)        Swallow Study   General HPI: The patient is an 82 year old white female s/p C5-6, C6-7 and C7-T1 anterior cervical discectomy, fusion, and plating 10/4.   Type of Study: Bedside Swallow Evaluation Previous Swallow Assessment: none Diet Prior to this Study: Dysphagia 3 (soft);Thin liquids Temperature Spikes Noted: No Respiratory Status: Room air History of Recent Intubation: Yes (surgery only) Behavior/Cognition: Alert;Cooperative;Pleasant mood Oral Cavity Assessment:  (left lateral lingual bruising) Oral Care Completed by SLP: No Oral Cavity - Dentition: Missing dentition Vision: Functional for self-feeding Self-Feeding Abilities: Able to feed self Patient Positioning: Upright in chair Baseline Vocal Quality: Normal Volitional Cough: Strong Volitional Swallow: Able to elicit    Oral/Motor/Sensory Function Overall Oral Motor/Sensory Function: Within functional limits   Ice Chips Ice chips: Not tested   Thin Liquid Thin Liquid: Impaired Presentation: Cup;Self Fed;Straw Pharyngeal  Phase Impairments: Cough - Immediate (thin via straw only)    Nectar Thick Nectar Thick Liquid: Not tested   Honey Thick Honey Thick Liquid: Not tested   Puree Puree: Within functional limits Presentation: Self Fed;Spoon   Solid   GO  Amillya Chavira MA, CCC-SLP [phone]  Solid: Within functional limits  Maxden Naji Meryl 04/05/2016,1:58 PM

## 2016-04-05 NOTE — Progress Notes (Signed)
Noted during medication administration, patient continues to cough after drinking water. Daughter noted patient had this problem for a while. Staff will continue to monitor and maintain safety. Patient OOB in chair

## 2016-04-05 NOTE — Progress Notes (Signed)
Patient is tolerating po, saline lock

## 2016-04-05 NOTE — Progress Notes (Addendum)
On call paged [phone] regarding patient's frequent urination; patient and patient daughter stated she finished UTI medication on Monday 04/02/16 and requesting to have it rechecked/another round of medication. Daughter states patient gets frequent UTIs.  Waiting for return call. Second page made [phone].

## 2016-04-06 ENCOUNTER — Inpatient Hospital Stay (HOSPITAL_COMMUNITY): Payer: Medicare Other

## 2016-04-06 LAB — URINE CULTURE: Culture: NO GROWTH

## 2016-04-06 LAB — GLUCOSE, CAPILLARY
GLUCOSE-CAPILLARY: 109 mg/dL — AB (ref 65–99)
GLUCOSE-CAPILLARY: 159 mg/dL — AB (ref 65–99)
GLUCOSE-CAPILLARY: 132 mg/dL — AB (ref 65–99)
Glucose-Capillary: 160 mg/dL — ABNORMAL HIGH (ref 65–99)
Glucose-Capillary: 123 mg/dL — ABNORMAL HIGH (ref 65–99)

## 2016-04-06 MED ORDER — INSULIN ASPART 100 UNIT/ML ~~LOC~~ SOLN
0.0000 [IU] | Freq: Three times a day (TID) | SUBCUTANEOUS | Status: DC
  Administered 2016-04-07 (×2): 3 [IU] via SUBCUTANEOUS
  Administered 2016-04-07: 4 [IU] via SUBCUTANEOUS
  Administered 2016-04-08: 7 [IU] via SUBCUTANEOUS
  Administered 2016-04-08: 3 [IU] via SUBCUTANEOUS
  Administered 2016-04-08: 7 [IU] via SUBCUTANEOUS
  Administered 2016-04-09 (×2): 4 [IU] via SUBCUTANEOUS

## 2016-04-06 MED ORDER — PAROXETINE HCL 20 MG PO TABS
30.0000 mg | ORAL_TABLET | Freq: Every day | ORAL | Status: DC
  Administered 2016-04-06 – 2016-04-08 (×3): 30 mg via ORAL
  Filled 2016-04-06 (×3): qty 2

## 2016-04-06 NOTE — Progress Notes (Signed)
Patient ID: [name], female   DOB: Jul 22, 1931, 82 y.o.   MRN: [id number] Pain better  Wound dry, neuro stabel  PT/Ot/ST

## 2016-04-06 NOTE — Progress Notes (Signed)
Speech Language Pathology  Patient Details Name: [name] MRN: [id number] DOB: February 15, 1932 Today's Date: 04/06/2016 Time: [id number] SLP Time Calculation (min) (ACUTE ONLY): 16 min    MBS completed. Full report will be documented. Recommend: Continue thin liquids (small sips) and soft diet for precaution given ACDF     [name] 04/06/2016, 11:56 AM  [name] Caroli[employer] [phone]

## 2016-04-06 NOTE — Evaluation (Signed)
Physical Therapy Evaluation Patient Details Name: [name] MRN: [id number] DOB: April 21, 1932 Today's Date: 04/06/2016   History of Present Illness  pt is an 82 y/o female with pmh fo DM, HTN, macular deg, RA, admitted with c/o left neck and arm pain with weakness.  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  Clinical Impression  Pt admitted with/for Left neck arm pain and weakness, s/p cervical ACDF.  Pt currently limited functionally due to the problems listed below.  (see problems list.)  Pt will benefit from PT to maximize function and safety to be able to get home safely with available assist of family.  Pt is at min guard level at this time except min to mod for pushing up to sit from L sidelying..     Follow Up Recommendations Home health PT    Equipment Recommendations  None recommended by PT (if does have RW)    Recommendations for Other Services       Precautions / Restrictions Precautions Precautions: Cervical;Fall Required Braces or Orthoses: Cervical Brace Cervical Brace: Hard collar Restrictions Weight Bearing Restrictions: No      Mobility  Bed Mobility Overal bed mobility: Needs Assistance Bed Mobility: Rolling;Sidelying to Sit Rolling: Min assist Sidelying to sit: Mod assist       General bed mobility comments: pt needed more assist up from L side due to L UE weakness;  cues for technique  Transfers Overall transfer level: Needs assistance Equipment used: Rolling walker (2 wheeled) Transfers: Sit to/from Stand Sit to Stand: Min assist         General transfer comment: cues for hand placement  Ambulation/Gait Ambulation/Gait assistance: Min assist;Min guard (min with no AD, min guard with RW) Ambulation Distance (Feet): 200 Feet Assistive device: Rolling walker (2 wheeled);1 person hand held assist Gait Pattern/deviations: Step-through pattern Gait velocity: slower Gait velocity  interpretation: Below normal speed for age/gender General Gait Details: With RW, pt generally steady with mild drift/wander with RW, cues for better use of the RW.  With no device, pt unsteady, with tendency to list posteriorly or forward with started a festinating pattern to catch herself.  Stairs            Wheelchair Mobility    Modified Rankin (Stroke Patients Only)       Balance Overall balance assessment: Needs assistance Sitting-balance support: Feet supported;No upper extremity supported Sitting balance-Leahy Scale: Fair     Standing balance support: No upper extremity supported;Bilateral upper extremity supported Standing balance-Leahy Scale: Fair                               Pertinent Vitals/Pain Pain Assessment: No/denies pain    Home Living Family/patient expects to be discharged to:: Private residence Living Arrangements: Alone Available Help at Discharge: Family;Available PRN/intermittently Type of Home: House Home Access: Stairs to enter   Entrance Stairs-Number of Steps: 1 Home Layout: One level Home Equipment: Shower seat;Grab bars - toilet;Walker - 2 wheels      Prior Function Level of Independence: Independent         Comments: pt reports hx of falls PTA. has been sponge bathing.     Hand Dominance   Dominant Hand: Right    Extremity/Trunk Assessment   Upper Extremity Assessment: Defer to OT evaluation           Lower Extremity Assessment: Overall WFL for tasks  assessed;Generalized weakness (mild proximal weakness)         Communication   Communication: No difficulties  Cognition Arousal/Alertness: Awake/alert Behavior During Therapy: WFL for tasks assessed/performed Overall Cognitive Status: Within Functional Limits for tasks assessed                      General Comments General comments (skin integrity, edema, etc.): pt instructed in neck precautions, need for brace all the time, lifting  restrictions, a dn initiated progression of activity.    Exercises     Assessment/Plan    PT Assessment Patient needs continued PT services  PT Problem List Decreased strength;Decreased activity tolerance;Decreased balance;Decreased mobility;Decreased coordination;Decreased knowledge of use of DME;Decreased knowledge of precautions          PT Treatment Interventions DME instruction;Gait training;Stair training;Functional mobility training;Therapeutic activities;Patient/family education;Balance training    PT Goals (Current goals can be found in the Care Plan section)  Acute Rehab PT Goals Patient Stated Goal: home independent PT Goal Formulation: With patient Time For Goal Achievement: 04/13/16 Potential to Achieve Goals: Good    Frequency Min 5X/week   Barriers to discharge        Co-evaluation               End of Session   Activity Tolerance: Patient tolerated treatment well Patient left: in chair;with call bell/phone within reach;with chair alarm set Nurse Communication: Mobility status         Time: 1340-1409 PT Time Calculation (min) (ACUTE ONLY): 29 min   Charges:   PT Evaluation $PT Eval Low Complexity: 1 Procedure     PT G Codes:        Imagene Boss, Tessie Fass 04/06/2016, 2:31 PM 04/06/2016  Donnella Sham, PT [phone]  (pager)

## 2016-04-06 NOTE — Discharge Instructions (Signed)
Fall Prevention in the Home  Falls can cause injuries and can affect people from all age groups. There are many simple things that you can do to make your home safe and to help prevent falls. WHAT CAN I DO ON THE OUTSIDE OF MY HOME?  Regularly repair the edges of walkways and driveways and fix any cracks.  Remove high doorway thresholds.  Trim any shrubbery on the main path into your home.  Use bright outdoor lighting.  Clear walkways of debris and clutter, including tools and rocks.  Regularly check that handrails are securely fastened and in good repair. Both sides of any steps should have handrails.  Install guardrails along the edges of any raised decks or porches.  Have leaves, snow, and ice cleared regularly.  Use sand or salt on walkways during winter months.  In the garage, clean up any spills right away, including grease or oil spills. WHAT CAN I DO IN THE BATHROOM?  Use night lights.  Install grab bars by the toilet and in the tub and shower. Do not use towel bars as grab bars.  Use non-skid mats or decals on the floor of the tub or shower.  If you need to sit down while you are in the shower, use a plastic, non-slip stool..  Keep the floor dry. Immediately clean up any water that spills on the floor.  Remove soap buildup in the tub or shower on a regular basis.  Attach bath mats securely with double-sided non-slip rug tape.  Remove throw rugs and other tripping hazards from the floor. WHAT CAN I DO IN THE BEDROOM?  Use night lights.  Make sure that a bedside light is easy to reach.  Do not use oversized bedding that drapes onto the floor.  Have a firm chair that has side arms to use for getting dressed.  Remove throw rugs and other tripping hazards from the floor. WHAT CAN I DO IN THE KITCHEN?   Clean up any spills right away.  Avoid walking on wet floors.  Place frequently used items in easy-to-reach places.  If you need to reach for something  above you, use a sturdy step stool that has a grab bar.  Keep electrical cables out of the way.  Do not use floor polish or wax that makes floors slippery. If you have to use wax, make sure that it is non-skid floor wax.  Remove throw rugs and other tripping hazards from the floor. WHAT CAN I DO IN THE STAIRWAYS?  Do not leave any items on the stairs.  Make sure that there are handrails on both sides of the stairs. Fix handrails that are broken or loose. Make sure that handrails are as long as the stairways.  Check any carpeting to make sure that it is firmly attached to the stairs. Fix any carpet that is loose or worn.  Avoid having throw rugs at the top or bottom of stairways, or secure the rugs with carpet tape to prevent them from moving.  Make sure that you have a light switch at the top of the stairs and the bottom of the stairs. If you do not have them, have them installed. WHAT ARE SOME OTHER FALL PREVENTION TIPS?  Wear closed-toe shoes that fit well and support your feet. Wear shoes that have rubber soles or low heels.  When you use a stepladder, make sure that it is completely opened and that the sides are firmly locked. Have someone hold the ladder while you   are using it. Do not climb a closed stepladder.  Add color or contrast paint or tape to grab bars and handrails in your home. Place contrasting color strips on the first and last steps.  Use mobility aids as needed, such as canes, walkers, scooters, and crutches.  Turn on lights if it is dark. Replace any light bulbs that burn out.  Set up furniture so that there are clear paths. Keep the furniture in the same spot.  Fix any uneven floor surfaces.  Choose a carpet design that does not hide the edge of steps of a stairway.  Be aware of any and all pets.  Review your medicines with your healthcare provider. Some medicines can cause dizziness or changes in blood pressure, which increase your risk of falling. Talk  with your health care provider about other ways that you can decrease your risk of falls. This may include working with a physical therapist or trainer to improve your strength, balance, and endurance.   This information is not intended to replace advice given to you by your health care provider. Make sure you discuss any questions you have with your health care provider.   Document Released: 06/08/2002 Document Revised: 11/02/2014 Document Reviewed: 07/23/2014 Elsevier Interactive Patient Education 2016 [employer]  

## 2016-04-06 NOTE — Evaluation (Signed)
Occupational Therapy Evaluation Patient Details Name: [name] MRN: [id number] DOB: 1932-01-03 Today's Date: 04/06/2016    History of Present Illness pt is an 82 y/o female with pmh fo DM, HTN, macular deg, RA, admitted with c/o left neck and arm pain with weakness.  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   Clinical Impression   Pt reports she was independent with ADL PTA. Currently pt overall min guard for ADL and functional mobility with consistent verbal cues to maintain cervical precautions and for safe use of RW. Began cervical, ADL, and safety education with pt. Pt planning to d/c home alone with intermittent supervision from family. Recommending HHOT for follow up to maximize pts independence and safety with ADL and functional mobility upon return home. Pt would benefit from continued skilled OT to address established goals.    Follow Up Recommendations  Home health OT;Supervision - Intermittent    Equipment Recommendations  None recommended by OT    Recommendations for Other Services       Precautions / Restrictions Precautions Precautions: Cervical;Fall Required Braces or Orthoses: Cervical Brace Cervical Brace: Hard collar Restrictions Weight Bearing Restrictions: No      Mobility Bed Mobility Overal bed mobility: Needs Assistance Bed Mobility: Rolling;Sidelying to Sit Rolling: Min assist Sidelying to sit: Mod assist       General bed mobility comments: pt needed more assist up from L side due to L UE weakness;  cues for technique  Transfers Overall transfer level: Needs assistance Equipment used: Rolling walker (2 wheeled) Transfers: Sit to/from Stand Sit to Stand: Min assist         General transfer comment: cues for hand placement    Balance Overall balance assessment: Needs assistance Sitting-balance support: Feet supported;No upper extremity supported Sitting balance-Leahy  Scale: Fair     Standing balance support: No upper extremity supported;Bilateral upper extremity supported Standing balance-Leahy Scale: Fair                              ADL Overall ADL's : Needs assistance/impaired Eating/Feeding: Set up;Sitting   Grooming: Supervision/safety;Standing;Wash/dry hands   Upper Body Bathing: Supervision/ safety;Sitting   Lower Body Bathing: Min guard;Sit to/from stand   Upper Body Dressing : Supervision/safety;Sitting   Lower Body Dressing: Min guard;Sit to/from stand Lower Body Dressing Details (indicate cue type and reason): Pt able to cross foot over opposite knee to pull up sock Toilet Transfer: Min guard;Ambulation;BSC;RW;Cueing for sequencing Toilet Transfer Details (indicate cue type and reason): Cues for safe use of RW. Toileting- Water quality scientist and Hygiene: Min guard;Sit to/from stand       Functional mobility during ADLs: Min guard;Rolling walker;Cueing for sequencing General ADL Comments: Educated pt on cervical precautions and maintining during functional activities. Pt requires consistent verbal cues to maintain precautions. Pt also requires cues for safe use of RW and proper technique for transfers using RW.     Vision Vision Assessment?: No apparent visual deficits   Perception     Praxis      Pertinent Vitals/Pain Pain Assessment: No/denies pain     Hand Dominance Right   Extremity/Trunk Assessment Upper Extremity Assessment Upper Extremity Assessment: Overall WFL for tasks assessed (proximal pain in LUE)   Lower Extremity Assessment Lower Extremity Assessment: Defer to PT evaluation   Cervical / Trunk Assessment Cervical / Trunk Assessment: Other exceptions Cervical / Trunk Exceptions: s/p cervical  sx   Communication Communication Communication: No difficulties   Cognition Arousal/Alertness: Awake/alert Behavior During Therapy: WFL for tasks assessed/performed Overall Cognitive Status:  Within Functional Limits for tasks assessed                     General Comments       Exercises       Shoulder Instructions      Home Living Family/patient expects to be discharged to:: Private residence Living Arrangements: Alone Available Help at Discharge: Family;Available PRN/intermittently Type of Home: House Home Access: Stairs to enter [employer] of Steps: 1   Home Layout: One level     Bathroom Shower/Tub: Teacher, early years/pre: Standard     Home Equipment: Shower seat;Grab bars - toilet;Walker - 2 wheels;Bedside commode          Prior Functioning/Environment Level of Independence: Independent        Comments: pt reports hx of falls PTA. has been sponge bathing.        OT Problem List: Decreased strength;Impaired balance (sitting and/or standing);Decreased safety awareness;Decreased knowledge of use of DME or AE;Decreased knowledge of precautions   OT Treatment/Interventions: Self-care/ADL training;Energy conservation;DME and/or AE instruction;Therapeutic activities;Patient/family education;Balance training    OT Goals(Current goals can be found in the care plan section) Acute Rehab OT Goals Patient Stated Goal: home independent OT Goal Formulation: With patient Time For Goal Achievement: 04/20/16 Potential to Achieve Goals: Good ADL Goals Pt Will Perform Grooming: with modified independence;standing Pt Will Transfer to Toilet: with modified independence;ambulating;bedside commode Pt Will Perform Toileting - Clothing Manipulation and hygiene: with modified independence;sit to/from stand Pt Will Perform Tub/Shower Transfer: Tub transfer;with modified independence;ambulating;shower seat;rolling walker Additional ADL Goal #1: Pt will independently verbally recall cervical precautions and maintain throughout ADL.  OT Frequency: Min 2X/week   Barriers to D/C: Decreased caregiver support  pt lives alone        Co-evaluation              End of Session Equipment Utilized During Treatment: Cervical collar;Rolling walker Nurse Communication: Mobility status  Activity Tolerance: Patient tolerated treatment well Patient left: in chair;with call bell/phone within reach;with chair alarm set   Time: 1340-1409 OT Time Calculation (min): 29 min Charges:  OT General Charges $OT Visit: 1 Procedure OT Evaluation $OT Eval Moderate Complexity: 1 Procedure G-Codes:     Binnie Kand M.S., OTR/L Pager: [phone]  04/06/2016, 3:59 PM

## 2016-04-06 NOTE — Progress Notes (Signed)
Patient ambulated 1 assist with RN with rolling walker 150 feet. Tolerated well. Returned to bed. Will continue to monitor.

## 2016-04-07 LAB — GLUCOSE, CAPILLARY
GLUCOSE-CAPILLARY: 129 mg/dL — AB (ref 65–99)
GLUCOSE-CAPILLARY: 106 mg/dL — AB (ref 65–99)
GLUCOSE-CAPILLARY: 193 mg/dL — AB (ref 65–99)
Glucose-Capillary: 137 mg/dL — ABNORMAL HIGH (ref 65–99)

## 2016-04-07 NOTE — Progress Notes (Signed)
Pt medicated for c/o left arm pain as ordered. Multiple episodes of urinary frequency also noted, no foul odor to urine, appearance clear. Monitoring to continue

## 2016-04-07 NOTE — Progress Notes (Signed)
Patient ID: [name], female   DOB: 1932/01/11, 82 y.o.   MRN: [id number] Vital signs are stable Left upper extremity is still weak Patient seems prone to gagging and choking easily Her motor function otherwise appears stable I contacted daughter today regarding discharge planning She notes that no one has discussed this with her and she feels that her mother is unsafe to be at home alone even with maximal family support at this time She notes concerns about her stability on her feet I noted physical therapy findings of unsteadiness and given the patient's current status she may be best served by being placed in a skilled nursing facility for a period of time to recuperate further I have placed an order for social services to discuss skilled nursing facility placement.

## 2016-04-07 NOTE — Progress Notes (Signed)
Physical Therapy Treatment Patient Details Name: [name] MRN: [id number] DOB: Feb 28, 1932 Today's Date: 04/07/2016    History of Present Illness pt is an 82 y/o female with pmh fo DM, HTN, macular deg, RA, admitted with c/o left neck and arm pain with weakness.  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    PT Comments    Patient tolerated increase in gait distance this session. Pt reported L UE pain upon arrival and bilat UE post mobility. Attempted ambulating without RW and HHA however pt is very unsteady and reported no decrease in UE without RW. Continue to progress as tolerated with anticipated d/c home with HHPT.   Follow Up Recommendations  Home health PT     Equipment Recommendations  None recommended by PT (has RW at home)    Recommendations for Other Services       Precautions / Restrictions Precautions Precautions: Cervical;Fall Required Braces or Orthoses: Cervical Brace Cervical Brace: Hard collar    Mobility  Bed Mobility Overal bed mobility: Needs Assistance Bed Mobility: Rolling;Sidelying to Sit Rolling: Min guard Sidelying to sit: Min assist       General bed mobility comments: cues for sequencing; rolled to R side; assist to elevate trunk all the way into sitting  Transfers Overall transfer level: Needs assistance Equipment used: Rolling walker (2 wheeled) Transfers: Sit to/from Stand Sit to Stand: Min guard         General transfer comment: cues for hand placement  Ambulation/Gait Ambulation/Gait assistance: Min guard;Min assist Ambulation Distance (Feet): 240 Feet Assistive device: Rolling walker (2 wheeled);1 person hand held assist Gait Pattern/deviations: Step-through pattern;Decreased stride length Gait velocity: slower   General Gait Details: attempted gait with HHA due to UE pain however pt very unsteady and reached for objects on opposite side to hold to for support;  pt with shorter, shuffling steps without AD and pt reported there is no decrease in pain without RW; min guard with RW and min cues for posture and safe use of AD   Stairs            Wheelchair Mobility    Modified Rankin (Stroke Patients Only)       Balance     Sitting balance-Leahy Scale: Fair       Standing balance-Leahy Scale: Fair                      Cognition Arousal/Alertness: Awake/alert Behavior During Therapy: WFL for tasks assessed/performed Overall Cognitive Status: Within Functional Limits for tasks assessed                      Exercises      General Comments General comments (skin integrity, edema, etc.): reviewed precautions      Pertinent Vitals/Pain Pain Assessment: 0-10 Pain Score: 5  Pain Location: L UE prior to ambulation and bilat UE post ambulation Pain Descriptors / Indicators: Aching;Grimacing;Guarding;Radiating Pain Intervention(s): Limited activity within patient's tolerance;Monitored during session;Premedicated before session;Repositioned    Home Living                      Prior Function            PT Goals (current goals can now be found in the care plan section) Acute Rehab PT Goals Patient Stated Goal: home independent Progress towards PT goals: Progressing toward goals    Frequency    Min  5X/week      PT Plan Current plan remains appropriate    Co-evaluation             End of Session Equipment Utilized During Treatment: Gait belt;Cervical collar Activity Tolerance: Patient tolerated treatment well Patient left: in chair;with call bell/phone within reach;with chair alarm set     Time: [id number] PT Time Calculation (min) (ACUTE ONLY): 24 min  Charges:  $Gait Training: 8-22 mins $Therapeutic Activity: 8-22 mins                    G Codes:      Salina April, PTA Pager: [phone]   04/07/2016, 9:18 AM

## 2016-04-07 NOTE — Progress Notes (Signed)
Pt observed to be sleeping in chair having been medicated earlier with pain meds. No issues to report at this time

## 2016-04-08 LAB — GLUCOSE, CAPILLARY
GLUCOSE-CAPILLARY: 147 mg/dL — AB (ref 65–99)
GLUCOSE-CAPILLARY: 115 mg/dL — AB (ref 65–99)
Glucose-Capillary: 216 mg/dL — ABNORMAL HIGH (ref 65–99)
Glucose-Capillary: 208 mg/dL — ABNORMAL HIGH (ref 65–99)

## 2016-04-08 MED ORDER — DEXAMETHASONE SODIUM PHOSPHATE 4 MG/ML IJ SOLN
4.0000 mg | Freq: Four times a day (QID) | INTRAMUSCULAR | Status: AC
  Administered 2016-04-08 – 2016-04-09 (×4): 4 mg via INTRAVENOUS
  Filled 2016-04-08 (×4): qty 1

## 2016-04-08 MED ORDER — SODIUM CHLORIDE 0.9% FLUSH
3.0000 mL | Freq: Two times a day (BID) | INTRAVENOUS | Status: DC
  Administered 2016-04-08 (×2): 3 mL via INTRAVENOUS

## 2016-04-08 MED ORDER — SODIUM CHLORIDE 0.9% FLUSH
3.0000 mL | INTRAVENOUS | Status: DC | PRN
Start: 2016-04-08 — End: 2016-04-09

## 2016-04-08 NOTE — Progress Notes (Signed)
Patient ID: [name], female   DOB: Jun 20, 1932, 82 y.o.   MRN: [id number] Seems more confused and disoriented today. Continues to complain of left arm aching. No numbness or tingling. She has good strength in all muscle groups of the left upper extremity. It is at least equal to the right. Gait not tested. Will likely need skilled nursing facility placement.

## 2016-04-08 NOTE — Progress Notes (Addendum)
Physical Therapy Treatment Patient Details Name: [name] MRN: [id number] DOB: [birth year] Today's Date: 04/08/2016    History of Present Illness pt is an 82 y/o female with pmh fo DM, HTN, macular deg, RA, admitted with c/o left neck and arm pain with weakness.  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    PT Comments    Agree with Mel Almond, OT and Dr. Ronnald Ramp; impulsive and disoriented, spoke with Marge, RN -- perhaps this is related to pain meds (?); worth considering SNF for rehab to facilitate dc home independently and safely.  Follow Up Recommendations  SNF to maximize independence and safety with mobility prior to dc home     Equipment Recommendations  3in1 (PT)    Recommendations for Other Services       Precautions / Restrictions Precautions Precautions: Cervical;Fall Precaution Comments: Pt unable to recall cervical precautions from previous education. Reviewed precautions with pt. Required Braces or Orthoses: Cervical Brace Cervical Brace: Hard collar Restrictions Weight Bearing Restrictions: No    Mobility  Bed Mobility               General bed mobility comments: Pt OOB in chair upon arrival.  Transfers Overall transfer level: Needs assistance Equipment used: Rolling walker (2 wheeled) Transfers: Sit to/from Stand Sit to Stand: Min guard         General transfer comment: Cues for hand placement and technique.  Ambulation/Gait Ambulation/Gait assistance: Min guard Ambulation Distance (Feet): 120 Feet Assistive device: Rolling walker (2 wheeled) Gait Pattern/deviations: Step-through pattern;Decreased step length - right;Decreased step length - left;Decreased stride length Gait velocity: slower   General Gait Details: Cues to self-monitor for activity tolerance; adjusted RW to better fit; occasionally havign rW too far in fornt of her   Stairs            Wheelchair Mobility     Modified Rankin (Stroke Patients Only)       Balance Overall balance assessment: Needs assistance Sitting-balance support: Feet supported;No upper extremity supported Sitting balance-Leahy Scale: Fair     Standing balance support: No upper extremity supported;During functional activity Standing balance-Leahy Scale: Poor Standing balance comment: Unsteady at sink while washing hands without UE support                    Cognition Arousal/Alertness: Awake/alert Behavior During Therapy: WFL for tasks assessed/performed Overall Cognitive Status: Impaired/Different from baseline Area of Impairment: Orientation;Memory;Safety/judgement;Awareness Orientation Level: Disoriented to;Place;Situation   Memory: Decreased recall of precautions;Decreased short-term memory   Safety/Judgement: Decreased awareness of safety;Decreased awareness of deficits Awareness: Intellectual   General Comments: Pt asking "when am I going back to the hospital?" Unable to identify why she was in hospital despite seeing bandage and cervical collar on her in mirror.    Exercises      General Comments        Pertinent Vitals/Pain Pain Assessment: Faces Faces Pain Scale: Hurts even more Pain Location: intermittent pain in LUE Pain Descriptors / Indicators: Aching;Grimacing Pain Intervention(s): Monitored during session;Repositioned    Home Living                      Prior Function            PT Goals (current goals can now be found in the care plan section) Acute Rehab PT Goals Patient Stated Goal: take a nap PT Goal Formulation: With patient Time For Goal Achievement:  04/13/16 Potential to Achieve Goals: Good Progress towards PT goals: Progressing toward goals (slowly)    Frequency    Min 5X/week      PT Plan Discharge plan needs to be updated    Co-evaluation             End of Session Equipment Utilized During Treatment: Gait belt;Cervical collar Activity  Tolerance: Patient tolerated treatment well Patient left: in chair;with call bell/phone within reach;with chair alarm set     Time: 1013-1030 PT Time Calculation (min) (ACUTE ONLY): 17 min  Charges:  $Gait Training: 8-22 mins                    G Codes:      Roney Marion Hamff 04/08/2016, 11:11 AM   Roney Marion, [place] Pager [phone] Office [phone]

## 2016-04-08 NOTE — Progress Notes (Signed)
Patient had received Percocet for pain at 0910 this am.  Appears markedly confused at this time, disoriented to place, time, and situation, and is impulsive.  Will alert staff as to possible effect of Percocet on patient.

## 2016-04-08 NOTE — Progress Notes (Signed)
Occupational Therapy Treatment Patient Details Name: [name] MRN: [id number] DOB: Apr 18, 1932 Today's Date: 04/08/2016    History of present illness pt is an 82 y/o female with pmh fo DM, HTN, macular deg, RA, admitted with c/o left neck and arm pain with weakness.  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   OT comments  Pt with increased confusion today; disoriented to place and situation and demonstrating impulsivity with functional tasks. Demonstrating decreased safety awareness and poor awareness of deficits throughout session today. Requires min guard assist for functional mobility but max verbal cues for safe use of RW. At this time feel pt is unable to d/c home with level of supervision that family is able to provide; updating d/c plan to SNF for further rehab prior to return home. Will continue to follow acutely.    Follow Up Recommendations  SNF;Supervision/Assistance - 24 hour    Equipment Recommendations  None recommended by OT    Recommendations for Other Services      Precautions / Restrictions Precautions Precautions: Cervical;Fall Precaution Comments: Pt unable to recall cervical precautions from previous education. Reviewed precautions with pt. Required Braces or Orthoses: Cervical Brace Cervical Brace: Hard collar Restrictions Weight Bearing Restrictions: No       Mobility Bed Mobility               General bed mobility comments: Pt OOB in chair upon arrival.  Transfers Overall transfer level: Needs assistance Equipment used: Rolling walker (2 wheeled) Transfers: Sit to/from Stand Sit to Stand: Min guard         General transfer comment: Cues for hand placement and technique.    Balance Overall balance assessment: Needs assistance Sitting-balance support: Feet supported;No upper extremity supported Sitting balance-Leahy Scale: Fair     Standing balance support: No upper  extremity supported;During functional activity Standing balance-Leahy Scale: Poor Standing balance comment: Unsteady at sink while washing hands without UE support                   ADL Overall ADL's : Needs assistance/impaired     Grooming: Min guard;Standing;Wash/dry Geophysical data processor Transfer: Min guard;Ambulation;BSC;RW;Cueing for safety;Cueing for sequencing   Toileting- Clothing Manipulation and Hygiene: Min guard;Sit to/from stand       Functional mobility during ADLs: Min guard;Rolling walker;Cueing for sequencing;Cueing for safety General ADL Comments: Pt with increased confusion today and requires max verbal cues for safety with RW and functional activities. Increased unsteadiness on feet and attempting to walk without use of RW.      Vision                     Perception     Praxis      Cognition   Behavior During Therapy: [facility] for tasks assessed/performed Overall Cognitive Status: Impaired/Different from baseline Area of Impairment: Orientation;Memory;Safety/judgement;Awareness Orientation Level: Disoriented to;Place;Situation   Memory: Decreased recall of precautions;Decreased short-term memory    Safety/Judgement: Decreased awareness of safety;Decreased awareness of deficits Awareness: Intellectual   General Comments: Pt asking "when am I going back to the hospital?" Unable to identify why she was in hospital despite seeing bandage and cervical collar on her in mirror.    Extremity/Trunk Assessment               Exercises     Shoulder Instructions  General Comments      Pertinent Vitals/ Pain       Pain Assessment: Faces Faces Pain Scale: Hurts even more Pain Location: intermittently LUE Pain Descriptors / Indicators: Grimacing;Guarding Pain Intervention(s): Monitored during session  Home Living                                          Prior Functioning/Environment               Frequency  Min 2X/week        Progress Toward Goals  OT Goals(current goals can now be found in the care plan section)  Progress towards OT goals: Not progressing toward goals - comment (increased confusion, continues to require min guard)  Acute Rehab OT Goals Patient Stated Goal: take a nap OT Goal Formulation: With patient  Plan Discharge plan needs to be updated    Co-evaluation                 End of Session Equipment Utilized During Treatment: Gait belt;Rolling walker;Cervical collar   Activity Tolerance Patient tolerated treatment well   Patient Left in chair;with call bell/phone within reach;with chair alarm set   Nurse Communication          Time: 1000-1015 OT Time Calculation (min): 15 min  Charges: OT General Charges $OT Visit: 1 Procedure OT Treatments $Self Care/Home Management : 8-22 mins  Binnie Kand M.S., OTR/L Pager: [phone]  04/08/2016, 10:22 AM

## 2016-04-09 LAB — GLUCOSE, CAPILLARY
GLUCOSE-CAPILLARY: 179 mg/dL — AB (ref 65–99)
Glucose-Capillary: 183 mg/dL — ABNORMAL HIGH (ref 65–99)

## 2016-04-09 MED ORDER — DOCUSATE SODIUM 100 MG PO CAPS: 100.0000 mg | ORAL_CAPSULE | Freq: Two times a day (BID) | ORAL | 0 refills | Status: DC

## 2016-04-09 MED ORDER — HYDROCODONE-ACETAMINOPHEN 5-325 MG PO TABS: 1.0000 | ORAL_TABLET | ORAL | 0 refills | Status: DC | PRN

## 2016-04-09 NOTE — Plan of Care (Signed)
Problem: SLP Dysphagia Goals Goal: Patient will utilize recommended strategies Patient will utilize recommended strategies during swallow to increase swallowing safety with  Outcome: Not Met (add Reason) AMS

## 2016-04-09 NOTE — Care Management Note (Signed)
Case Management Note  Patient Details  Name: TAHYA RAAK MRN: [id number] Date of Birth: 06-Apr-1932  Subjective/Objective:                    Action/Plan: Pt discharging to [facility]. No further needs per CM.  Expected Discharge Date:                  Expected Discharge Plan:  Skilled Nursing Facility  In-House Referral:  Clinical Social Work  Discharge planning Services     Post Acute Care Choice:    Choice offered to:     DME Arranged:    DME Agency:     HH Arranged:    [place] Agency:     Status of Service:  Completed, signed off  If discussed at [employer], dates discussed:    Additional Comments:  Pollie Friar, RN 04/09/2016, 6:56 PM

## 2016-04-09 NOTE — Care Management Important Message (Signed)
Important Message  Patient Details  Name: [name] MRN: [id number] Date of Birth: 1932-05-29   Medicare Important Message Given:  Yes    Audon Heymann 04/09/2016, 11:46 AM

## 2016-04-09 NOTE — Clinical Social Work Note (Signed)
Clinical Social Work Assessment  Patient Details  Name: [name] MRN: [id number] Date of Birth: [birth year]  Date of referral:  04/07/16               Reason for consult:  Facility Placement                Permission sought to share information with:  Family Supports Permission granted to share information::  Yes, Verbal Permission Granted  Name::     [name]  Agency::     Relationship::  Daughter  Contact Information:  [phone]  Housing/Transportation Living arrangements for the past 2 months:  Single Family Home Source of Information:  Adult Children (Daughter [name] at the bedside) Patient Interpreter Needed:  None Criminal Activity/Legal Involvement Pertinent to Current Situation/Hospitalization:  No - Comment as needed Significant Relationships:  Other Family Members, Adult Children Lives with:  Self Do you feel safe going back to the place where you live?  No (Daughter and patient agree that ST rehab needed before patient can d/c home, expecially since she lives alone ) Need for family participation in patient care:  Yes (Comment)  Care giving concerns:  Daughter expressed concern regarding patient discharging home as she lives alone. Ms. Duanne Guess reported that she has a sister, however she is the primary caretaker for patient and handles her business also.  Social Worker assessment / plan:  CSW talked with patient and daughter [name] (at the bedside) regarding discharge planning and recommendation of ST rehab. Patient was in the restroom when CSW arrived and ambulated to bed using a walker. Daughter and patient in agreement with ST rehab and CSW explained SNF search process and provided patient/daughter SNF list for [facility]. Preferences are [facility], [facility] (patient's sister at this facility) and [facility].  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  Patient/Family's Understanding of and Emotional Response to Diagnosis, Current Treatment, and Prognosis:  Not discussed.  Emotional Assessment Appearance:  Appears stated age Attitude/Demeanor/Rapport:  Other (Appropriate) Affect (typically observed):  Appropriate, Pleasant Orientation:  Oriented to Self, Oriented to Place, Oriented to  Time, Oriented to Situation, Fluctuating Orientation (Suspected and/or reported [place]) (Fluctuating orientation suspected with time) Alcohol / Substance use:  Tobacco Use, Alcohol Use, Illicit Drugs (Patient reported that she never smoked and does not drink or use illicit drugs) Psych involvement (Current and /or in the community):  No (Comment)  Discharge Needs  Concerns to be addressed:  Discharge Planning Concerns Readmission within the last 30 days:  No Current discharge risk:  None Barriers to Discharge:  No Barriers Identified   Sable Feil, LCSW 04/09/2016, 2:55 PM

## 2016-04-09 NOTE — Progress Notes (Signed)
Speech Language Pathology Treatment: Dysphagia  Patient Details Name: [name] MRN: [id number] DOB: 1931/07/15 Today's Date: 04/09/2016 Time: 0955-1006 SLP Time Calculation (min) (ACUTE ONLY): 11 min  Assessment / Plan / Recommendation Clinical Impression  F/u diet tolerance assessment complete. Patient able to consume clinician provided po trials with min s/s of aspiration, cough x 1 with large bolus size only. Moderate cueing provided today for use of compensatory strategies which eliminated overt indication of aspiration. Note that current medications impacting mentation and ability to carry out strategies with modified independence as goal states. Daughter presents, aware of precautions, and assisting to provide cueing for patient until mentation clears. No further SLP needs indicated.    HPI HPI: The patient is an 82 year old white female s/p C5-6, C6-7 and C7-T1 anterior cervical discectomy, fusion, and plating 10/4. MBS recommended.        SLP Plan  Discharge SLP treatment due to (comment) (see HPI)     Recommendations  Diet recommendations: Dysphagia 3 (mechanical soft);Thin liquid Liquids provided via: Cup;No straw Medication Administration: Whole meds with liquid Supervision: Patient able to self feed;Full supervision/cueing for compensatory strategies (until mentation clears) Compensations: Slow rate;Small sips/bites Postural Changes and/or Swallow Maneuvers: Seated upright 90 degrees                Oral Care Recommendations: Oral care BID Follow up Recommendations: None Plan: Discharge SLP treatment due to (comment) (see HPI)       [place], CCC-SLP [phone]   [name] 04/09/2016, 10:19 AM

## 2016-04-09 NOTE — Progress Notes (Signed)
Report given to Kim from [facility]. All belongings sent with pt's family. Awaiting for transport.  Ave Filter, RN

## 2016-04-09 NOTE — [place] FL2 (Signed)
[place] MEDICAID FL2 LEVEL OF CARE SCREENING TOOL     IDENTIFICATION  Patient Name: [name] Birthdate: [birth year] Sex: female Admission Date (Current Location): 04/04/2016  [facility] and [place] Number:  Herbalist and Address:  [facility] [place], [place] [address], [place], [place] [address]      Provider Number: [id number]  Attending Physician Name and Address:  Newman Pies, MD  Relative Name and Phone Number:  [name] - daughter.  [phone]    Current Level of Care: Hospital Recommended Level of Care: [place] Prior Approval Number:    Date Approved/Denied:   PASRR Number:  ([id number])  Discharge Plan: SNF    Current Diagnoses: Patient Active Problem List   Diagnosis Date Noted  . Cervical spondylosis with radiculopathy 04/04/2016  . Recurrent UTI 10/06/2015  . Vaginal atrophy 10/06/2015  . Diabetes mellitus, type 2 ([place]) 08/02/2015  . ILD (interstitial lung disease) ([place]) 08/02/2015  . Headache, migraine 08/02/2015  . Inflammation of a vein 08/02/2015  . Anxiety and depression 10/15/2013  . Essential (primary) hypertension 10/15/2013  . Gout 10/15/2013  . Degeneration macular 10/15/2013  . Combined fat and carbohydrate induced hyperlipemia 10/15/2013  . Arthritis, degenerative 10/15/2013  . Arthritis or polyarthritis, rheumatoid ([place]) 10/15/2013    Orientation RESPIRATION BLADDER Height & Weight     Self, Time, Situation, Place (Orientation to Place fluctuates)  Normal Continent Weight: 157 lb (71.2 kg) Height:  5\' 2"  (157.5 cm)  BEHAVIORAL SYMPTOMS/MOOD NEUROLOGICAL BOWEL NUTRITION STATUS      Continent Diet (Low sodium - Heart healthy)  AMBULATORY STATUS COMMUNICATION OF NEEDS Skin   Limited Assist Verbally Other (Comment) (Incision neck. Patient has neck brace)                       Personal Care Assistance Level of Assistance  Bathing, Feeding, Dressing Bathing Assistance:  Limited assistance Feeding assistance: Independent (Help with set-up) Dressing Assistance: Limited assistance     Functional Limitations Info  Sight, Hearing, Speech Sight Info: Adequate Hearing Info: Impaired (Primarily left ear) Speech Info: Adequate    SPECIAL CARE FACTORS FREQUENCY  PT (By licensed PT), OT (By licensed OT), Speech therapy     PT Frequency: Evaluation 10/6 and a minimum of 5X per week therapy recommended OT Frequency: Evaluation 10/6 and a minimum of 2X per week therapy recommended     Speech Therapy Frequency: Evaluation 10/5      Contractures Contractures Info: Not present    Additional Factors Info  Insulin Sliding Scale, Allergies, Code Status Code Status Info: Full code Allergies Info: Lisinopril, Penicillins, Clarithromycin, Sulfa Antibiotics   Insulin Sliding Scale Info: 0-20 Units 3X a day before meals and at bedtime       Current Medications (04/09/2016):  This is the current hospital active medication list Current Facility-Administered Medications  Medication Dose Route Frequency Provider Last Rate Last Dose  . acetaminophen (TYLENOL) tablet 650 mg  650 mg Oral Q4H PRN Newman Pies, MD   650 mg at 04/08/16 1801   Or  . acetaminophen (TYLENOL) suppository 650 mg  650 mg Rectal Q4H PRN Newman Pies, MD      . albuterol (PROVENTIL) (2.5 MG/3ML) 0.083% nebulizer solution 3 mL  3 mL Inhalation Q6H PRN Newman Pies, MD      . allopurinol (ZYLOPRIM) tablet 300 mg  300 mg Oral Daily Newman Pies, MD   300 mg at 04/09/16 0842  . alum &  mag hydroxide-simeth (MAALOX/MYLANTA) 200-200-20 MG/5ML suspension 30 mL  30 mL Oral Q6H PRN Newman Pies, MD      . bisacodyl (DULCOLAX) suppository 10 mg  10 mg Rectal Daily PRN Newman Pies, MD      . carvedilol (COREG) tablet 6.25 mg  6.25 mg Oral BID WC Newman Pies, MD   6.25 mg at 04/09/16 0842  . docusate sodium (COLACE) capsule 100 mg  100 mg Oral BID Newman Pies, MD   100 mg at 04/09/16  0842  . folic acid (FOLVITE) tablet 1 mg  1 mg Oral Daily Newman Pies, MD   1 mg at 04/09/16 0842  . HYDROcodone-acetaminophen (NORCO/VICODIN) 5-325 MG per tablet 1-2 tablet  1-2 tablet Oral Q4H PRN Newman Pies, MD   1 tablet at 04/09/16 1124  . insulin aspart (novoLOG) injection 0-20 Units  0-20 Units Subcutaneous TID AC & HS Newman Pies, MD   4 Units at 04/09/16 1210  . lactated [place] infusion   Intravenous Continuous Duane Boston, MD   Stopped at 04/04/16 1631  . lactated [place] infusion   Intravenous Continuous Newman Pies, MD 75 mL/hr at 04/05/16 0154    . losartan (COZAAR) tablet 100 mg  100 mg Oral Daily Newman Pies, MD   100 mg at 04/09/16 0842  . menthol-cetylpyridinium (CEPACOL) lozenge 3 mg  1 lozenge Oral PRN Newman Pies, MD       Or  . phenol (CHLORASEPTIC) mouth spray 1 spray  1 spray Mouth/Throat PRN Newman Pies, MD      . metFORMIN (GLUCOPHAGE) tablet 500 mg  500 mg Oral BID WC Newman Pies, MD   500 mg at 04/09/16 0842  . morphine 2 MG/ML injection 1-4 mg  1-4 mg Intravenous Q3H PRN Newman Pies, MD      . multivitamin with minerals tablet 1 tablet  1 tablet Oral Daily Newman Pies, MD   1 tablet at 04/09/16 [phone]  . ondansetron (ZOFRAN) injection 4 mg  4 mg Intravenous Q4H PRN Newman Pies, MD      . oxyCODONE-acetaminophen (PERCOCET/ROXICET) 5-325 MG per tablet 1-2 tablet  1-2 tablet Oral Q4H PRN Newman Pies, MD   2 tablet at 04/08/16 0910  . PARoxetine (PAXIL) tablet 30 mg  30 mg Oral Daily Newman Pies, MD   30 mg at 04/08/16 2034  . pravastatin (PRAVACHOL) tablet 40 mg  40 mg Oral q1800 Newman Pies, MD   40 mg at 04/08/16 1802  . sodium chloride flush (NS) 0.9 % injection 3 mL  3 mL Intravenous Q12H Kary Kos, MD   3 mL at 04/08/16 2200  . sodium chloride flush (NS) 0.9 % injection 3 mL  3 mL Intravenous PRN Kary Kos, MD         Discharge Medications: Please see discharge summary for a list of discharge  medications.  Relevant Imaging Results:  Relevant Lab Results:   Additional Information ss#[id number].  Surgery on C5-6, C6-7 and C7-T1 on 10/5.  Sable Feil, LCSW

## 2016-04-09 NOTE — Clinical Social Work Placement (Signed)
   CLINICAL SOCIAL WORK PLACEMENT  NOTE 04/09/16 - DISCHARGED TO [facility], [place]  Date:  04/09/2016  Patient Details  Name: [name] MRN: [id number] Date of Birth: [birth year]  Clinical Social Work is seeking post-discharge placement for this patient at the [place] level of care (*CSW will initial, date and re-position this form in  chart as items are completed):  Yes   Patient/family provided with [facility] Work Department's list of facilities offering this level of care within the geographic area requested by the patient (or if unable, by the patient's family).  Yes   Patient/family informed of their freedom to choose among providers that offer the needed level of care, that participate in Medicare, Medicaid or managed care program needed by the patient, have an available bed and are willing to accept the patient.  Yes   Patient/family informed of [facility]'s ownership interest in [facility] and [facility], as well as of the fact that they are under no obligation to receive care at these facilities.  PASRR submitted to EDS on       PASRR number received on       Existing PASRR number confirmed on 04/09/16     FL2 transmitted to all facilities in geographic area requested by pt/family on 04/09/16     FL2 transmitted to all facilities within larger geographic area on       Patient informed that his/her managed care company has contracts with or will negotiate with certain facilities, including the following:        Yes   Patient/family informed of bed offers received.  Patient chooses bed at [facility]     Physician recommends and patient chooses bed at      Patient to be transferred to [facility] on 04/09/16.  Patient to be transferred to facility by Ambulance     Patient family notified on 04/02/16 of transfer.  Name of family member notified:  Daughter [name], at the bedside      PHYSICIAN       Additional Comment:    _______________________________________________ Sable Feil, LCSW 04/09/2016, 3:01 PM

## 2016-04-09 NOTE — Discharge Summary (Signed)
Physician Discharge Summary  Patient ID: [name] MRN: [id number] DOB/AGE: 07/11/1931 82 y.o.  Admit date: 04/04/2016 Discharge date: 04/09/2016  Admission Diagnoses:C5-6, C6-7 and C7-T1 herniated disc, spondylosis, cervical radiculopathy, cervicalgia, cervical myelopathy  Discharge Diagnoses: The same Active Problems:   Cervical spondylosis with radiculopathy   Discharged Condition: good  Hospital Course: I performed a C5-6, C6-7 and C7-T1 anterior cervical discectomy, fusion, and plating on the patient on 04/04/2016. The surgery went well.  The patient's postoperative course was remarkable for some confusion. This was attributed to be hospitalized and medications. This resolved.  The patient's family did not feel they could adequately care for her at home so arrangements were made for transfer to a skilled nursing facility.  Consults: Physical therapy, speech therapy Significant Diagnostic Studies: None Treatments: C5-6, C6-7 and C7-T1 anterior cervical discectomy, fusion, and plating. Discharge Exam: Blood pressure (!) 177/74, pulse 82, temperature 98.6 F (37 C), temperature source Oral, resp. rate 20, height 5\' 2"  (1.575 m), weight 71.2 kg (157 lb), SpO2 98 %. The patient is alert and pleasant. She is Glasgow Coma Scale 15, oriented 3. She is moving all 4 extremities well. Her radicular pain has resolved with surgery. Her incision is healing well. There is no hematoma or midline shift.  Disposition: Skilled nursing facility  Discharge Instructions    Call MD for:  difficulty breathing, headache or visual disturbances    Complete by:  As directed    Call MD for:  extreme fatigue    Complete by:  As directed    Call MD for:  hives    Complete by:  As directed    Call MD for:  persistant dizziness or light-headedness    Complete by:  As directed    Call MD for:  persistant nausea and vomiting    Complete by:  As directed    Call MD for:  redness, tenderness, or  signs of infection (pain, swelling, redness, odor or green/yellow discharge around incision site)    Complete by:  As directed    Call MD for:  severe uncontrolled pain    Complete by:  As directed    Call MD for:  temperature >100.4    Complete by:  As directed    Diet - low sodium heart healthy    Complete by:  As directed    Discharge instructions    Complete by:  As directed    Call [phone] for a followup appointment. Take a stool softener while you are using pain medications.   Driving Restrictions    Complete by:  As directed    Do not drive for 2 weeks.   Increase activity slowly    Complete by:  As directed    Lifting restrictions    Complete by:  As directed    Do not lift more than 5 pounds. No excessive bending or twisting.   May shower / Bathe    Complete by:  As directed    He may shower after the pain she is removed 3 days after surgery. Leave the incision alone.   No dressing needed    Complete by:  As directed        Medication List    STOP taking these medications   acetaminophen 500 MG tablet Commonly known as:  TYLENOL   ibuprofen 200 MG tablet Commonly known as:  ADVIL,MOTRIN   nitrofurantoin (macrocrystal-monohydrate) 100 MG capsule Commonly known as:  MACROBID   oxyCODONE-acetaminophen 5-325 MG tablet Commonly  known as:  PERCOCET/ROXICET     TAKE these medications   albuterol 108 (90 Base) MCG/ACT inhaler Commonly known as:  PROVENTIL HFA;VENTOLIN HFA Inhale 2 puffs into the lungs every 6 (six) hours as needed for wheezing or shortness of breath. Reported on 10/04/2015   allopurinol 300 MG tablet Commonly known as:  ZYLOPRIM Take 300 mg by mouth daily.   aspirin EC 325 MG tablet Take 325 mg by mouth daily.   carvedilol 6.25 MG tablet Commonly known as:  COREG Take 6.25 mg by mouth 2 (two) times daily with a meal.   Cranberry 400 MG Caps Take 1 capsule by mouth 2 (two) times daily.   docusate sodium 100 MG capsule Commonly known  as:  COLACE Take 1 capsule (100 mg total) by mouth 2 (two) times daily.   folic acid A999333 MCG tablet Commonly known as:  FOLVITE Take 400 mcg by mouth daily.   HUMIRA 40 MG/0.8ML Pskt Generic drug:  Adalimumab Inject 40 mg into the skin every 21 ( twenty-one) days.   HYDROcodone-acetaminophen 5-325 MG tablet Commonly known as:  NORCO/VICODIN Take 1 tablet by mouth every 4 (four) hours as needed for moderate pain.   losartan 100 MG tablet Commonly known as:  COZAAR Take 100 mg by mouth daily.   lovastatin 40 MG tablet Commonly known as:  MEVACOR Take 40 mg by mouth at bedtime.   metFORMIN 500 MG tablet Commonly known as:  GLUCOPHAGE Take 500 mg by mouth 2 (two) times daily with a meal.   methotrexate 2.5 MG tablet Commonly known as:  RHEUMATREX Take 15 mg by mouth once a week. Pt takes on Wednesday.   Caution:Chemotherapy. Protect from light.   multivitamin with minerals Tabs tablet Take 1 tablet by mouth daily.   PARoxetine 30 MG tablet Commonly known as:  PAXIL Take 30 mg by mouth daily.        SignedNewman Pies D 04/09/2016, 10:10 AM

## 2016-04-10 NOTE — Telephone Encounter (Signed)
Spoke with pt daughter in reference to pt having UTI. Daughter stated that pt had neck surgery last week to remove disk and was just moved to rehab last night. Reinforced with daughter while at rehab pt should be cath'd for specimen and once she goes home she needs to come into our office for a cath specimen. Daughter voiced understanding.

## 2016-04-17 ENCOUNTER — Encounter (HOSPITAL_COMMUNITY): Payer: Self-pay | Admitting: Neurosurgery

## 2016-10-01 NOTE — Progress Notes (Signed)
10:34 AM   [name] 1931/09/22 [id number]  Referring provider: Dion Body, MD [place] [facility] [name], [name] [id number]  Chief Complaint  Patient presents with  . Recurrent UTI    1 year follow up  . Vaginal Atrophy    HPI: Patient is an 83 year old Caucasian female with a history of recurrent urinary tract infections who presents today for one year follow-up.    Background story 5 positive urine culture's for E. Coli and eight documented positive E. Coli  UTI's over the previous year.  Risk factors for UTI's are age, vaginal atrophy, constipation, drinking little if any water and drinking quite a few sodas per day.    S/p hysterectomy and bladder tack. No history of kidney stones.    She is currently on Macrobid for an UTI by her PCP at this time.  She is having urgency x 4-7, frequency x 4-7, engages in toilet mapping, incontinence x 8 and nocturia x 4-7.  She is not having dysuria, gross hematuria and suprapubic pain.  She fevers, chills, nausea or vomiting.  Her PVR was 19 mL.    PMH: Past Medical History:  Diagnosis Date  . Anxiety   . Breast cancer ([place]) 2015   right- radiation  . Cough   . Depression   . Diabetes mellitus without complication ([place])   . Gout   . HLD (hyperlipidemia)   . HTN (hypertension)   . Hypertension   . Interstitial lung disease ([place])   . Lumbar spinal stenosis   . Macular degeneration   . Migraines   . Osteoarthritis   . Phlebitis   . Pulmonary nodule   . Rheumatoid arthritis [facility])     Surgical History: Past Surgical History:  Procedure Laterality Date  . ABDOMINAL HYSTERECTOMY    . ANTERIOR CERVICAL DECOMP/DISCECTOMY FUSION N/A 04/04/2016   Procedure: ANTERIOR CERVICAL DECOMPRESSION/DISCECTOMY FUSION, INTERBODY PROSTHESIS,PLATE CERVICAL  FIVE-SIX,CERVICAL SIX-SEVEN,CERVICAL SEVEN-THORACIC ONE;  Surgeon: Newman Pies, MD;  Location: [facility];  Service: Neurosurgery;  Laterality: N/A;  . BREAST  BIOPSY Right 02/21/2015   path pending  . BREAST BIOPSY Right 03/29/2016   path pending  . COLONOSCOPY    . ESOPHAGOGASTRODUODENOSCOPY    . MASTECTOMY, PARTIAL Right     Home Medications:  Allergies as of 10/02/2016      Reactions   Lisinopril Cough   Oxycodone-acetaminophen Other (See Comments)   Other reaction(s): Hallucination Caused AMS & hallucinations   Penicillins Other (See Comments)   Makes pt very "forgetful"   Clarithromycin Rash   Sulfa Antibiotics Rash      Medication List       Accurate as of 10/02/16 10:34 AM. Always use your most recent med list.          albuterol 108 (90 Base) MCG/ACT inhaler Commonly known as:  PROVENTIL HFA;VENTOLIN HFA Inhale 2 puffs into the lungs every 6 (six) hours as needed for wheezing or shortness of breath. Reported on 10/04/2015   allopurinol 300 MG tablet Commonly known as:  ZYLOPRIM Take 300 mg by mouth daily.   aspirin EC 325 MG tablet Take 325 mg by mouth daily.   busPIRone 5 MG tablet Commonly known as:  BUSPAR TAKE 1 TABLET BY MOUTH TWICE DAILY   Calcium Carbonate-Vitamin D3 600-400 MG-UNIT Tabs Take by mouth.   carvedilol 6.25 MG tablet Commonly known as:  COREG Take 6.25 mg by mouth 2 (two) times daily with a meal.   Cranberry 400 MG Caps Take  1 capsule by mouth 2 (two) times daily.   docusate sodium 100 MG capsule Commonly known as:  COLACE Take 1 capsule (100 mg total) by mouth 2 (two) times daily.   fluticasone 50 MCG/ACT nasal spray Commonly known as:  FLONASE Place into the nose.   folic acid 130 MCG tablet Commonly known as:  FOLVITE Take 400 mcg by mouth daily.   HUMIRA 40 MG/0.8ML Pskt Generic drug:  Adalimumab Inject 40 mg into the skin every 21 ( twenty-one) days.   HYDROcodone-acetaminophen 5-325 MG tablet Commonly known as:  NORCO/VICODIN Take 1 tablet by mouth every 4 (four) hours as needed for moderate pain.   losartan 100 MG tablet Commonly known as:  COZAAR Take 100 mg by mouth  daily.   lovastatin 40 MG tablet Commonly known as:  MEVACOR Take 40 mg by mouth at bedtime.   metFORMIN 500 MG tablet Commonly known as:  GLUCOPHAGE Take 500 mg by mouth 2 (two) times daily with a meal.   methotrexate 2.5 MG tablet Commonly known as:  RHEUMATREX Take 15 mg by mouth once a week. Pt takes on Wednesday.   Caution:Chemotherapy. Protect from light.   methotrexate 2.5 MG tablet Commonly known as:  RHEUMATREX TAKE 6 TABLETS(15 MG) BY MOUTH EVERY 7 DAYS   MULTI-VITAMINS Tabs Take by mouth.   multivitamin with minerals Tabs tablet Take 1 tablet by mouth daily.   nitrofurantoin (macrocrystal-monohydrate) 100 MG capsule Commonly known as:  MACROBID Take by mouth.   PARoxetine 30 MG tablet Commonly known as:  PAXIL Take 30 mg by mouth daily.   triamcinolone cream 0.5 % Commonly known as:  KENALOG Apply topically.       Allergies:  Allergies  Allergen Reactions  . Lisinopril Cough  . Oxycodone-Acetaminophen Other (See Comments)    Other reaction(s): Hallucination Caused AMS & hallucinations  . Penicillins Other (See Comments)    Makes pt very "forgetful"  . Clarithromycin Rash  . Sulfa Antibiotics Rash    Family History: Family History  Problem Relation Age of Onset  . Colon cancer Mother   . Stomach cancer Mother   . Esophageal cancer Mother   . COPD Father   . Colon polyps Sister   . Ovarian cancer Sister   . Kidney disease Neg Hx   . Bladder Cancer Neg Hx   . Kidney cancer Neg Hx     Social History:  reports that she has never smoked. She has never used smokeless tobacco. She reports that she does not drink alcohol or use drugs.  ROS: UROLOGY Frequent Urination?: No Hard to postpone urination?: No Burning/pain with urination?: No Get up at night to urinate?: Yes Leakage of urine?: Yes Urine stream starts and stops?: No Trouble starting stream?: No Do you have to strain to urinate?: No Blood in urine?: No Urinary tract infection?:  Yes Sexually transmitted disease?: No Injury to kidneys or bladder?: No Painful intercourse?: No Weak stream?: No Currently pregnant?: No Vaginal bleeding?: No Last menstrual period?: n  Gastrointestinal Nausea?: No Vomiting?: No Indigestion/heartburn?: No Diarrhea?: No Constipation?: No  Constitutional Fever: No Night sweats?: No Weight loss?: No Fatigue?: No  Skin Skin rash/lesions?: Yes Itching?: Yes  Eyes Blurred vision?: No Double vision?: No  Ears/Nose/Throat Sore throat?: No Sinus problems?: No  Hematologic/Lymphatic Swollen glands?: No Easy bruising?: No  Cardiovascular Leg swelling?: No Chest pain?: No  Respiratory Cough?: No Shortness of breath?: No  Endocrine Excessive thirst?: No  Musculoskeletal Back pain?: No Joint pain?: No  Neurological Headaches?: No Dizziness?: No  Psychologic Depression?: Yes Anxiety?: Yes  Physical Exam: BP (!) 157/80   Pulse 76   Ht 5' 1.5" (1.562 m)   Wt 154 lb 4.8 oz (70 kg)   BMI 28.68 kg/m   Constitutional:  Alert and oriented, No acute distress. HEENT: [place] AT, moist mucus membranes.  Trachea midline, no masses. Cardiovascular: No clubbing, cyanosis, or edema. Respiratory: Normal respiratory effort, no increased work of breathing. Skin: No rashes, bruises or suspicious lesions. Lymph: No cervical or inguinal adenopathy. Neurologic: Grossly intact, no focal deficits, moving all 4 extremities. Psychiatric: Normal mood and affect.  Laboratory Data:  Lab Results  Component Value Date   WBC 6.6 03/27/2016   HGB 12.3 03/27/2016   HCT 37.9 03/27/2016   MCV 94.3 03/27/2016   PLT 207 03/27/2016    Lab Results  Component Value Date   CREATININE 0.73 03/27/2016     Lab Results  Component Value Date   HGBA1C 7.0 (H) 03/27/2016    Pertinent Imaging Results for Mackenzie, [name] (MRN [id number]) as of 10/02/2016 10:41  Ref. Range 10/02/2016 10:41  Scan Result Unknown 19   Assessment & Plan:     1. History of recurrent UTI  - Reviewed UTI preventative strategies  - Asked the patient to contact us with symptoms of urinary tract infections  - Follow-up in one year for symptom recheck  2. Nocturia  - I explained to the patient that nocturia is often multi-factorial and difficult to treat.  Sleeping disorders, heart conditions, peripheral vascular disease, diabetes, an enlarged prostate for men, an urethral stricture causing bladder outlet obstruction and/or certain medications can contribute to nocturia.  - I have suggested that the patient avoid caffeine after noon and alcohol in the evening.  He or she may also benefit from fluid restrictions after 6:00 in the evening and voiding just prior to bedtime.  - I have explained that research studies have showed that over 84% of patients with sleep apnea reported frequent nighttime urination.   With sleep apnea, oxygen decreases, carbon dioxide increases, the blood become more acidic, the heart rate drops and blood vessels in the lung constrict.  The body is then alerted that something is very wrong. The sleeper must wake enough to reopen the airway. By this time, the heart is racing and experiences a false signal of fluid overload. The heart excretes a hormone-like protein that tells the body to get rid of sodium and water, resulting in nocturia.  -  I also informed the patient that a recent study noted that decreasing sodium intake to 2.3 grams daily, if they don't have issues with hyponatremia, can also reduce the number of nightly voids  - There is also an increased incidence in sleep apnea with menopause, symptoms include night sweats, daytime sleepiness, depressed mood, and cognitive complaints like poor concentration or problems with short-term memory   - The patient may benefit from a discussion with his or her primary care physician to see if he or she has risk factors for sleep apnea or other sleep disturbances and obtaining a sleep  study.  3. Urge incontinence  - offered behavioral therapies, bladder training, bladder control strategies and pelvic floor muscle training - patient declined  - fluid management - encouraged patient to drink more water - patient states water makes her sick  - offered medical therapy with anticholinergic therapy or beta-3 adrenergic receptor agonist and the potential side effects of each therapy - not a candidate  for anticholinergic due to age  - would like to try the beta-3 adrenergic receptor agonist (Myrbetriq).  Given Myrbetriq 25 mg samples, #28.  I have reviewed with the patient of the side effects of Myrbetriq, such as: elevation in BP, urinary retention and/or HA.    - RTC in 3 weeks for PVR and symptom recheck   4. Vaginal Atrophy-  Not a candidate for vaginal estrogen d/t h/o breast cancer. Patient using coconut oil as a vaginal moisturizer.  Return in about 3 weeks (around 10/23/2016) for PVR and OAB questionnaire.  These notes generated with voice recognition software. I apologize for typographical errors.  Zara Council, [facility] [address] [place], [place] [address] [phone]

## 2016-10-02 ENCOUNTER — Encounter: Payer: Self-pay | Admitting: Urology

## 2016-10-02 ENCOUNTER — Ambulatory Visit (INDEPENDENT_AMBULATORY_CARE_PROVIDER_SITE_OTHER): Payer: Medicare Other | Admitting: Urology

## 2016-10-02 VITALS — BP 157/80 | HR 76 | Ht 61.5 in | Wt 154.3 lb

## 2016-10-02 DIAGNOSIS — R351 Nocturia: Secondary | ICD-10-CM | POA: Diagnosis not present

## 2016-10-02 DIAGNOSIS — N952 Postmenopausal atrophic vaginitis: Secondary | ICD-10-CM | POA: Diagnosis not present

## 2016-10-02 DIAGNOSIS — Z87440 Personal history of urinary (tract) infections: Secondary | ICD-10-CM | POA: Diagnosis not present

## 2016-10-02 DIAGNOSIS — N3941 Urge incontinence: Secondary | ICD-10-CM

## 2016-10-02 LAB — BLADDER SCAN AMB NON-IMAGING: Scan Result: 19

## 2016-10-02 NOTE — Patient Instructions (Addendum)
Urinary Tract Infection Prevention Patient Education Stay Hydrated: Urinary tract infections (UTIs) are less likely to occur in someone who is drinking enough water to promote regular urination, so it is very important to stay hydrated in order to help flush out bacteria from the urinary tract. Respond to "Nature's Call": It is always a good idea to urinate as soon as you feel the need. While "holding it in" does not directly cause an infection, it can cause overdistension that can damage the lining of the bladder, making it more vulnerable to bacteria. Remove Tampons Before Going: Remember to always take out tampons before urinating, and change tampons often.  Practice Proper Bathroom Hygiene: To keep bacteria near the urethral opening to a minimum, it is important to practice proper wiping techniques (i.e. front to back wiping) to help prevent rectal bacteria from entering the uretro-genital area. It can also be helpful to take showers and avoid soaking in the bathtub.  Take a Vitamin C Supplement: About 1,000 milligrams of vitamin C taken daily can help inhibit the growth of some bacteria by acidifying the urine. Maintain Control with Cranberries: Cranberries contain hippuronic acid, which is a natural antiseptic that may help prevent the adherence of bacteria to the bladder lining. Drinking 100% pure cranberry juice or taking over the counter cranberry supplements twice daily may help to prevent an infection. However, it is important to note that cranberry juices/supplements are not helpful once a urinary tract infection (UTI) is present. Strengthen Your Core: Often, a lazy bladder (unable to empty urine properly) occurs due to lower back problem, so consider doing exercises to help strengthen your back, pelvic floor, and stomach muscles.  Pay Attention to Your Urine: Your urine can change color for a variety of reasons, including from the medications you  take, so pay close attention to it to monitor your overall health. One key thing to note is that if your urine is typically a darker yellow, your body is dehydrated, so you need to step up your water intake.    DASH Eating Plan DASH stands for "Dietary Approaches to Stop Hypertension." The DASH eating plan is a healthy eating plan that has been shown to reduce high blood pressure (hypertension). It may also reduce your risk for type 2 diabetes, heart disease, and stroke. The DASH eating plan may also help with weight loss. What are tips for following this plan? General guidelines   Avoid eating more than 2,300 mg (milligrams) of salt (sodium) a day. If you have hypertension, you may need to reduce your sodium intake to 1,500 mg a day.  Limit alcohol intake to no more than 1 drink a day for nonpregnant women and 2 drinks a day for men. One drink equals 12 oz of beer, 5 oz of wine, or 1 oz of hard liquor.  Work with your health care provider to maintain a healthy body weight or to lose weight. Ask what an ideal weight is for you.  Get at least 30 minutes of exercise that causes your heart to beat faster (aerobic exercise) most days of the week. Activities may include walking, swimming, or biking.  Work with your health care provider or diet and nutrition specialist (dietitian) to adjust your eating plan to your  individual calorie needs. Reading food labels   Check food labels for the amount of sodium per serving. Choose foods with less than 5 percent of the Daily Value of sodium. Generally, foods with less than 300 mg of sodium per serving fit into this eating plan.  To find whole grains, look for the word "whole" as the first word in the ingredient list. Shopping   Buy products labeled as "low-sodium" or "no salt added."  Buy fresh foods. Avoid canned foods and premade or frozen meals. Cooking   Avoid adding salt when cooking. Use salt-free seasonings or herbs instead of table salt or  sea salt. Check with your health care provider or pharmacist before using salt substitutes.  Do not fry foods. Cook foods using healthy methods such as baking, boiling, grilling, and broiling instead.  Cook with heart-healthy oils, such as olive, canola, soybean, or sunflower oil. Meal planning    Eat a balanced diet that includes:  5 or more servings of fruits and vegetables each day. At each meal, try to fill half of your plate with fruits and vegetables.  Up to 6-8 servings of whole grains each day.  Less than 6 oz of lean meat, poultry, or fish each day. A 3-oz serving of meat is about the same size as a deck of cards. One egg equals 1 oz.  2 servings of low-fat dairy each day.  A serving of nuts, seeds, or beans 5 times each week.  Heart-healthy fats. Healthy fats called Omega-3 fatty acids are found in foods such as flaxseeds and coldwater fish, like sardines, salmon, and mackerel.  Limit how much you eat of the following:  Canned or prepackaged foods.  Food that is high in trans fat, such as fried foods.  Food that is high in saturated fat, such as fatty meat.  Sweets, desserts, sugary drinks, and other foods with added sugar.  Full-fat dairy products.  Do not salt foods before eating.  Try to eat at least 2 vegetarian meals each week.  Eat more home-cooked food and less restaurant, buffet, and fast food.  When eating at a restaurant, ask that your food be prepared with less salt or no salt, if possible. What foods are recommended? The items listed may not be a complete list. Talk with your dietitian about what dietary choices are best for you. Grains  Whole-grain or whole-wheat bread. Whole-grain or whole-wheat pasta. Brown rice. [place]. Bulgur. Whole-grain and low-sodium cereals. Pita bread. Low-fat, low-sodium crackers. Whole-wheat flour tortillas. Vegetables  Fresh or frozen vegetables (raw, steamed, roasted, or grilled). Low-sodium or reduced-sodium  tomato and vegetable juice. Low-sodium or reduced-sodium tomato sauce and tomato paste. Low-sodium or reduced-sodium canned vegetables. Fruits  All fresh, dried, or frozen fruit. Canned fruit in natural juice (without added sugar). Meat and other protein foods  Skinless chicken or [place]. Ground chicken or [place]. Pork with fat trimmed off. Fish and seafood. Egg whites. Dried beans, peas, or lentils. Unsalted nuts, nut butters, and seeds. Unsalted canned beans. Lean cuts of beef with fat trimmed off. Low-sodium, lean deli meat. Dairy  Low-fat (1%) or fat-free (skim) milk. Fat-free, low-fat, or reduced-fat cheeses. Nonfat, low-sodium ricotta or cottage cheese. Low-fat or nonfat yogurt. Low-fat, low-sodium cheese. Fats and oils  Soft margarine without trans fats. Vegetable oil. Low-fat, reduced-fat, or light mayonnaise and salad dressings (reduced-sodium). Canola, safflower, olive, soybean, and sunflower oils. Avocado. Seasoning and other foods  Herbs. Spices. Seasoning mixes without salt. Unsalted popcorn and pretzels. Fat-free sweets.  What foods are not recommended? The items listed may not be a complete list. Talk with your dietitian about what dietary choices are best for you. Grains  Baked goods made with fat, such as croissants, muffins, or some breads. Dry pasta or rice meal packs. Vegetables  Creamed or fried vegetables. Vegetables in a cheese sauce. Regular canned vegetables (not low-sodium or reduced-sodium). Regular canned tomato sauce and paste (not low-sodium or reduced-sodium). Regular tomato and vegetable juice (not low-sodium or reduced-sodium). Angie Fava. Olives. Fruits  Canned fruit in a light or heavy syrup. Fried fruit. Fruit in cream or butter sauce. Meat and other protein foods  Fatty cuts of meat. Ribs. Fried meat. Berniece Salines. Sausage. Bologna and other processed lunch meats. Salami. Fatback. Hotdogs. Bratwurst. Salted nuts and seeds. Canned beans with added salt. Canned or smoked  fish. Whole eggs or egg yolks. Chicken or [place] with skin. Dairy  Whole or 2% milk, cream, and half-and-half. Whole or full-fat cream cheese. Whole-fat or sweetened yogurt. Full-fat cheese. Nondairy creamers. Whipped toppings. Processed cheese and cheese spreads. Fats and oils  Butter. Stick margarine. Lard. Shortening. Ghee. Bacon fat. Tropical oils, such as coconut, palm kernel, or palm oil. Seasoning and other foods  Salted popcorn and pretzels. Onion salt, garlic salt, seasoned salt, table salt, and sea salt. Worcestershire sauce. Tartar sauce. Barbecue sauce. Teriyaki sauce. Soy sauce, including reduced-sodium. Steak sauce. Canned and packaged gravies. Fish sauce. Oyster sauce. Cocktail sauce. Horseradish that you find on the shelf. Ketchup. Mustard. Meat flavorings and tenderizers. Bouillon cubes. Hot sauce and Tabasco sauce. Premade or packaged marinades. Premade or packaged taco seasonings. Relishes. Regular salad dressings. Where to find more information:  National Heart, Lung, and [place]: https://wilson-eaton.com/  American Heart Association: www.heart.org Summary  The DASH eating plan is a healthy eating plan that has been shown to reduce high blood pressure (hypertension). It may also reduce your risk for type 2 diabetes, heart disease, and stroke.  With the DASH eating plan, you should limit salt (sodium) intake to 2,300 mg a day. If you have hypertension, you may need to reduce your sodium intake to 1,500 mg a day.  When on the DASH eating plan, aim to eat more fresh fruits and vegetables, whole grains, lean proteins, low-fat dairy, and heart-healthy fats.  Work with your health care provider or diet and nutrition specialist (dietitian) to adjust your eating plan to your individual calorie needs. This information is not intended to replace advice given to you by your health care provider. Make sure you discuss any questions you have with your health care provider. Document  Released: 06/07/2011 Document Revised: 06/11/2016 Document Reviewed: 06/11/2016 Elsevier Interactive Patient Education  2017 [employer].

## 2016-10-04 ENCOUNTER — Ambulatory Visit: Payer: Medicare Other | Admitting: Urology

## 2016-10-23 ENCOUNTER — Ambulatory Visit
Admission: RE | Admit: 2016-10-23 | Discharge: 2016-10-23 | Disposition: A | Discharge status: Home / Self Care | Payer: Medicare Other | Source: Ambulatory Visit | Attending: Radiation Oncology | Admitting: Radiation Oncology

## 2016-10-23 ENCOUNTER — Other Ambulatory Visit: Payer: Self-pay | Admitting: *Deleted

## 2016-10-23 VITALS — BP 184/82 | HR 78 | Temp 95.8°F | Resp 18 | Wt 156.5 lb

## 2016-10-23 DIAGNOSIS — C50411 Malignant neoplasm of upper-outer quadrant of right female breast: Secondary | ICD-10-CM

## 2016-10-23 NOTE — Progress Notes (Signed)
Radiation Oncology Follow up Note  Name: [name]   Date:   10/23/2016 MRN:  [id number] DOB: 1932/05/27    This 82 y.o. 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  REFERRING PROVIDER: Dion Body, MD  HPI: Patient is an 83 year old female now seen out 2 years having completed radiation therapy to her right breast for stage I invasive mammary carcinoma triple negative. She is seen today in routine follow-up and is doing well. Last mammogram was back in September 2017. At that time there was a irregular partially calcified mass at 1:00 position which was biopsied and found to be fat necrosis. She is not on antiestrogen therapy based on the triple negative nature of her disease. She specifically denies breast tenderness cough or bone pain.   COMPLICATIONS OF TREATMENT: none  FOLLOW UP COMPLIANCE: keeps appointments   PHYSICAL EXAM:  BP (!) 184/82   Pulse 78   Temp (!) 95.8 F (35.4 C) (Tympanic)   Resp 18   Wt 156 lb 8.4 oz (71 kg)   BMI 29.10 kg/m  Lungs are clear to A&P cardiac examination essentially unremarkable with regular rate and rhythm. No dominant mass or nodularity is noted in either breast in 2 positions examined. Incision is well-healed. No axillary or supraclavicular adenopathy is appreciated. Cosmetic result is excellent. Well-developed well-nourished patient in NAD. HEENT reveals PERLA, EOMI, discs not visualized.  Oral cavity is clear. No oral mucosal lesions are identified. Neck is clear without evidence of cervical or supraclavicular adenopathy. Lungs are clear to A&P. Cardiac examination is essentially unremarkable with regular rate and rhythm without murmur rub or thrill. Abdomen is benign with no organomegaly or masses noted. Motor sensory and DTR levels are equal and symmetric in the upper and lower extremities. Cranial nerves II  through XII are grossly intact. Proprioception is intact. No peripheral adenopathy or edema is identified. No motor or sensory levels are noted. Crude visual fields are within normal range.  RADIOLOGY RESULTS: Mammograms from September 2017 are reviewed and compatible with the above-stated findings  PLAN: Present time patient is doing well with no evidence of disease. I'm please were overall progress. I have ordered follow-up mammograms on the patient for this fall. I've asked to see her back in 1 year for follow-up. Patient is to call sooner with any concerns.  I would like to take this opportunity to thank you for allowing me to participate in the care of your patient.[name]., MD

## 2016-10-24 ENCOUNTER — Ambulatory Visit: Payer: Medicare Other | Admitting: Urology

## 2016-11-05 ENCOUNTER — Ambulatory Visit: Payer: Medicare Other | Admitting: Urology

## 2016-11-06 ENCOUNTER — Telehealth: Payer: Self-pay | Admitting: Urology

## 2016-11-06 NOTE — Telephone Encounter (Signed)
Spoke with pt daughter, Amy, in reference to pt being seen tomorrow. Amy stated that she would see what she could do and call back in the morning.

## 2016-11-06 NOTE — Telephone Encounter (Signed)
Would see be able to come at 1:30 pm tomorrow?

## 2016-11-06 NOTE — Telephone Encounter (Signed)
Pt's daughter, Latanya Presser, called and thinks pt has another UTI.  She has had 3 within the past few months.  She has appt with Larene Beach this Friday in [place].  Please give her a call [phone].

## 2016-11-06 NOTE — Telephone Encounter (Signed)
Spoke with pt daughter in reference pt having UTI s/s. Daughter described s/s to be dysuria, pressure, urgency, and frequency. Daughter stated that she has been giving pt AZO and pt has an appt with Larene Beach on Friday. Please advise. Offered pt nurse visit for tomorrow but daughter requested Larene Beach be made aware first before they have 2 visits.

## 2016-11-07 ENCOUNTER — Telehealth: Payer: Self-pay | Admitting: Urology

## 2016-11-07 ENCOUNTER — Other Ambulatory Visit: Payer: Self-pay | Admitting: *Deleted

## 2016-11-07 DIAGNOSIS — N390 Urinary tract infection, site not specified: Secondary | ICD-10-CM

## 2016-11-07 NOTE — Progress Notes (Signed)
10:21 AM   [name] 07/08/1931 [id number]  Referring provider: Dion Body, MD [facility] [place], [place] [address]  Chief Complaint  Patient presents with  . Recurrent UTI    3 week follow up   . Vaginal Atrophy    HPI: Patient is an 83 year old Caucasian female who presents today for a three week follow up with a trial of Myrbetriq for urge incontinence.   Patient has a history of recurrent urinary tract infections.  5 positive urine culture's for E. Coli and eight documented positive E. Coli  UTI's over the previous year.  Risk factors for UTI's are age, vaginal atrophy, constipation, drinking little if any water and drinking quite a few sodas per day.    S/p hysterectomy and bladder tack. No history of kidney stones.    Daughter recently contacted the office a few days ago with symptoms of a urinary tract infection.  Daughter stated the patient was complaining of dysuria, pressure, urgency and frequency.  They were offered a nurse visit, but they declined and wanted to wait until her follow-up appointment today.     Patient's daughter states that she is taking cranberry tablets daily, probiotics daily, Vitamin C daily (not sure of the mg), but she is only drinking 8 oz of water daily.    Today, The patient has been experiencing urgency x 4-7 (stable), frequency x 4-7 (stable), not restricting fluids to avoid visits to the restroom, is engaging in toilet mapping, incontinence x 8 (stable) and nocturia x 8 (worse).    She feels that the Myrbetriq was not effective.   She states that all of her urinary problems happen at night, but she has not had a sleep study.  She states that her dysuria has abated since taking OTC AZO.  Her UA today demonstrated 6-30 WBC's.   PVR was 100 mL.      PMH: Past Medical History:  Diagnosis Date  . Anxiety   . Breast cancer ([place]) 2015   right- radiation  . Cough   . Depression   . Diabetes mellitus  without complication ([place])   . Gout   . HLD (hyperlipidemia)   . HTN (hypertension)   . Hypertension   . Interstitial lung disease ([place])   . Lumbar spinal stenosis   . Macular degeneration   . Migraines   . Osteoarthritis   . Phlebitis   . Pulmonary nodule   . Rheumatoid arthritis [facility])     Surgical History: Past Surgical History:  Procedure Laterality Date  . ABDOMINAL HYSTERECTOMY    . ANTERIOR CERVICAL DECOMP/DISCECTOMY FUSION N/A 04/04/2016   Procedure: ANTERIOR CERVICAL DECOMPRESSION/DISCECTOMY FUSION, INTERBODY PROSTHESIS,PLATE CERVICAL  FIVE-SIX,CERVICAL SIX-SEVEN,CERVICAL SEVEN-THORACIC ONE;  Surgeon: Newman Pies, MD;  Location: [facility];  Service: Neurosurgery;  Laterality: N/A;  . BREAST BIOPSY Right 02/21/2015   path pending  . BREAST BIOPSY Right 03/29/2016   path pending  . COLONOSCOPY    . ESOPHAGOGASTRODUODENOSCOPY    . MASTECTOMY, PARTIAL Right     Home Medications:  Allergies as of 11/09/2016      Reactions   Lisinopril Cough   Oxycodone-acetaminophen Other (See Comments)   Other reaction(s): Hallucination Caused AMS & hallucinations   Penicillins Other (See Comments)   Makes pt very "forgetful"   Clarithromycin Rash   Sulfa Antibiotics Rash      Medication List       Accurate as of 11/09/16 10:21 AM. Always use your most recent med list.  albuterol 108 (90 Base) MCG/ACT inhaler Commonly known as:  PROVENTIL HFA;VENTOLIN HFA Inhale 2 puffs into the lungs every 6 (six) hours as needed for wheezing or shortness of breath. Reported on 10/04/2015   allopurinol 300 MG tablet Commonly known as:  ZYLOPRIM Take 300 mg by mouth daily.   aspirin EC 325 MG tablet Take 325 mg by mouth daily.   busPIRone 5 MG tablet Commonly known as:  BUSPAR TAKE 1 TABLET BY MOUTH TWICE DAILY   Calcium Carbonate-Vitamin D3 600-400 MG-UNIT Tabs Take by mouth.   carvedilol 6.25 MG tablet Commonly known as:  COREG Take 6.25 mg by mouth 2 (two) times daily  with a meal.   Cranberry 400 MG Caps Take 1 capsule by mouth 2 (two) times daily.   fluticasone 50 MCG/ACT nasal spray Commonly known as:  FLONASE Place into the nose.   folic acid 696 MCG tablet Commonly known as:  FOLVITE Take 400 mcg by mouth daily.   HUMIRA 40 MG/0.8ML Pskt Generic drug:  Adalimumab Inject 40 mg into the skin every 21 ( twenty-one) days.   losartan 100 MG tablet Commonly known as:  COZAAR Take 100 mg by mouth daily.   lovastatin 40 MG tablet Commonly known as:  MEVACOR Take 40 mg by mouth at bedtime.   metFORMIN 500 MG tablet Commonly known as:  GLUCOPHAGE Take 500 mg by mouth 2 (two) times daily with a meal.   methotrexate 2.5 MG tablet Commonly known as:  RHEUMATREX TAKE 6 TABLETS(15 MG) BY MOUTH EVERY 7 DAYS   multivitamin with minerals Tabs tablet Take 1 tablet by mouth daily.   PARoxetine 30 MG tablet Commonly known as:  PAXIL Take 30 mg by mouth daily.   triamcinolone cream 0.5 % Commonly known as:  KENALOG Apply topically.       Allergies:  Allergies  Allergen Reactions  . Lisinopril Cough  . Oxycodone-Acetaminophen Other (See Comments)    Other reaction(s): Hallucination Caused AMS & hallucinations  . Penicillins Other (See Comments)    Makes pt very "forgetful"  . Clarithromycin Rash  . Sulfa Antibiotics Rash    Family History: Family History  Problem Relation Age of Onset  . Colon cancer Mother   . Stomach cancer Mother   . Esophageal cancer Mother   . COPD Father   . Colon polyps Sister   . Ovarian cancer Sister   . Kidney disease Neg Hx   . Bladder Cancer Neg Hx   . Kidney cancer Neg Hx     Social History:  reports that she has never smoked. She has never used smokeless tobacco. She reports that she does not drink alcohol or use drugs.  ROS: UROLOGY Frequent Urination?: Yes Hard to postpone urination?: No Burning/pain with urination?: No Get up at night to urinate?: Yes Leakage of urine?: Yes Urine  stream starts and stops?: No Trouble starting stream?: Yes Do you have to strain to urinate?: No Blood in urine?: No Urinary tract infection?: Yes Sexually transmitted disease?: No Injury to kidneys or bladder?: No Painful intercourse?: No Weak stream?: No Currently pregnant?: No Vaginal bleeding?: No Last menstrual period?: n  Gastrointestinal Nausea?: No Vomiting?: No Indigestion/heartburn?: No Diarrhea?: No Constipation?: No  Constitutional Fever: No Night sweats?: No Weight loss?: No Fatigue?: No  Skin Skin rash/lesions?: No Itching?: No  Eyes Blurred vision?: No Double vision?: No  Ears/Nose/Throat Sore throat?: No Sinus problems?: No  Hematologic/Lymphatic Swollen glands?: No Easy bruising?: No  Cardiovascular Leg swelling?: No Chest  pain?: No  Respiratory Cough?: No Shortness of breath?: No  Endocrine Excessive thirst?: No  Musculoskeletal Back pain?: No Joint pain?: Yes  Neurological Headaches?: No Dizziness?: No  Psychologic Depression?: Yes Anxiety?: Yes  Physical Exam: BP (!) 154/72   Pulse 78   Ht 5\' 1"  (1.549 m)   Wt 152 lb (68.9 kg)   BMI 28.72 kg/m   Constitutional:  Alert and oriented, No acute distress. HEENT: [place] AT, moist mucus membranes.  Trachea midline, no masses. Cardiovascular: No clubbing, cyanosis, or edema. Respiratory: Normal respiratory effort, no increased work of breathing. Skin: No rashes, bruises or suspicious lesions. Lymph: No cervical or inguinal adenopathy. Neurologic: Grossly intact, no focal deficits, moving all 4 extremities. Psychiatric: Normal mood and affect.  Laboratory Data:  Lab Results  Component Value Date   WBC 6.6 03/27/2016   HGB 12.3 03/27/2016   HCT 37.9 03/27/2016   MCV 94.3 03/27/2016   PLT 207 03/27/2016    Lab Results  Component Value Date   CREATININE 0.73 03/27/2016     Lab Results  Component Value Date   HGBA1C 7.0 (H) 03/27/2016      Assessment & Plan:     1. History of recurrent UTI  - Reviewed UTI preventative strategies  - Asked the patient to contact us with symptoms of urinary tract infections  - Follow-up in one year for symptom recheck  2. Nocturia  - Reviewed with the patient and her daughter that nocturia is often multi-factorial and difficult to treat.  Sleeping disorders, heart conditions, peripheral vascular disease, diabetes, an enlarged prostate for men, an urethral stricture causing bladder outlet obstruction and/or certain medications can contribute to nocturia.  - I have suggested that the patient avoid caffeine after noon and alcohol in the evening.  He or she may also benefit from fluid restrictions after 6:00 in the evening and voiding just prior to bedtime.  - I have explained that research studies have showed that over 84% of patients with sleep apnea reported frequent nighttime urination.   With sleep apnea, oxygen decreases, carbon dioxide increases, the blood become more acidic, the heart rate drops and blood vessels in the lung constrict.  The body is then alerted that something is very wrong. The sleeper must wake enough to reopen the airway. By this time, the heart is racing and experiences a false signal of fluid overload. The heart excretes a hormone-like protein that tells the body to get rid of sodium and water, resulting in nocturia.  -  I also informed the patient that a recent study noted that decreasing sodium intake to 2.3 grams daily, if they don't have issues with hyponatremia, can also reduce the number of nightly voids  - There is also an increased incidence in sleep apnea with menopause, symptoms include night sweats, daytime sleepiness, depressed mood, and cognitive complaints like poor concentration or problems with short-term memory   - The patient may benefit from a discussion with his or her primary care physician to see if he or she has risk factors for sleep apnea or other sleep disturbances and obtaining  a sleep study.- she is not interested in pursuing a sleep study at this time  3. Urge incontinence  - offered behavioral therapies, bladder training, bladder control strategies and pelvic floor muscle training - patient declined  - fluid management - encouraged patient to drink more water - patient states water makes her sick  - failed Myrbetriq  - offered PTNS therapy and patient and  daughter declined  4. Vaginal Atrophy-  Not a candidate for vaginal estrogen d/t h/o breast cancer. Patient using coconut oil as a vaginal moisturizer.  Return for pending urine resutls.  These notes generated with voice recognition software. I apologize for typographical errors.  Zara Council, [facility] [address], [place], [place] [phone]

## 2016-11-07 NOTE — Telephone Encounter (Signed)
Pt's daughter, Amy, called this morning and said they would just keep appt for Friday and not come in today.  Just F.Y.I.

## 2016-11-09 ENCOUNTER — Ambulatory Visit (INDEPENDENT_AMBULATORY_CARE_PROVIDER_SITE_OTHER): Payer: Medicare Other | Admitting: Urology

## 2016-11-09 ENCOUNTER — Other Ambulatory Visit
Admission: RE | Admit: 2016-11-09 | Discharge: 2016-11-09 | Disposition: A | Discharge status: Home / Self Care | Payer: Medicare Other | Source: Ambulatory Visit | Attending: Urology | Admitting: Urology

## 2016-11-09 ENCOUNTER — Other Ambulatory Visit: Payer: Medicare Other

## 2016-11-09 ENCOUNTER — Encounter: Payer: Self-pay | Admitting: Urology

## 2016-11-09 ENCOUNTER — Ambulatory Visit: Payer: Medicare Other

## 2016-11-09 VITALS — BP 154/72 | HR 78 | Ht 61.0 in | Wt 152.0 lb

## 2016-11-09 DIAGNOSIS — R351 Nocturia: Secondary | ICD-10-CM | POA: Diagnosis not present

## 2016-11-09 DIAGNOSIS — Z87440 Personal history of urinary (tract) infections: Secondary | ICD-10-CM

## 2016-11-09 DIAGNOSIS — N952 Postmenopausal atrophic vaginitis: Secondary | ICD-10-CM

## 2016-11-09 DIAGNOSIS — R300 Dysuria: Secondary | ICD-10-CM | POA: Insufficient documentation

## 2016-11-09 DIAGNOSIS — N3941 Urge incontinence: Secondary | ICD-10-CM | POA: Diagnosis not present

## 2016-11-09 DIAGNOSIS — N390 Urinary tract infection, site not specified: Secondary | ICD-10-CM | POA: Insufficient documentation

## 2016-11-09 LAB — URINALYSIS, COMPLETE (UACMP) WITH MICROSCOPIC
BILIRUBIN URINE: NEGATIVE
Glucose, UA: NEGATIVE mg/dL
Hgb urine dipstick: NEGATIVE
Ketones, ur: NEGATIVE mg/dL
LEUKOCYTES UA: NEGATIVE
Nitrite: NEGATIVE
Protein, ur: NEGATIVE mg/dL
Specific Gravity, Urine: 1.02 (ref 1.005–1.030)
pH: 6.5 (ref 5.0–8.0)

## 2016-11-09 NOTE — Progress Notes (Signed)
In and Out Catheterization  Patient is present today for a I & O catheterization due to recurrent uti. Patient was cleaned and prepped in a sterile fashion with betadine and Lidocaine 2% jelly was instilled into the urethra.  A 14FR cath was inserted, no complications 747FT of urine return was noted, urine was amber in color. A clean urine sample was collected for Urinalysis. Bladder was drained and catheter was removed with out difficulty.    Preformed by: Lyndee Hensen CMA

## 2016-11-10 LAB — URINE CULTURE: Culture: NO GROWTH

## 2016-11-12 ENCOUNTER — Telehealth: Payer: Self-pay | Admitting: *Deleted

## 2016-11-12 NOTE — Telephone Encounter (Signed)
-----   Message from Nori Riis, PA-C sent at 11/09/2016 11:31 AM EDT ----- Please let the patient know that her UA looks like there is an infection and we need to send it for culture.  Results will be available on Monday.

## 2016-11-12 NOTE — Telephone Encounter (Signed)
-----   Message from Nori Riis, PA-C sent at 11/10/2016  7:33 PM EDT ----- Please let Mrs. Polak and her daughter know that her urine culture was negative.

## 2016-11-12 NOTE — Telephone Encounter (Signed)
Daughter was informed of negative culture.

## 2016-11-12 NOTE — Telephone Encounter (Signed)
Per note on top of result note stated to call patient/daughter to let them know urine culture was negative. Daughter informed.

## 2016-11-21 ENCOUNTER — Other Ambulatory Visit: Payer: Self-pay | Admitting: Radiation Oncology

## 2016-11-21 ENCOUNTER — Ambulatory Visit
Admission: RE | Admit: 2016-11-21 | Discharge: 2016-11-21 | Disposition: A | Discharge status: Home / Self Care | Payer: Medicare Other | Source: Ambulatory Visit | Attending: Radiation Oncology | Admitting: Radiation Oncology

## 2016-11-21 DIAGNOSIS — R928 Other abnormal and inconclusive findings on diagnostic imaging of breast: Secondary | ICD-10-CM

## 2016-11-21 DIAGNOSIS — C50411 Malignant neoplasm of upper-outer quadrant of right female breast: Secondary | ICD-10-CM

## 2016-11-21 DIAGNOSIS — N6312 Unspecified lump in the right breast, upper inner quadrant: Secondary | ICD-10-CM | POA: Diagnosis not present

## 2016-11-21 DIAGNOSIS — Z853 Personal history of malignant neoplasm of breast: Secondary | ICD-10-CM | POA: Diagnosis not present

## 2016-11-21 DIAGNOSIS — N6310 Unspecified lump in the right breast, unspecified quadrant: Secondary | ICD-10-CM

## 2016-11-30 ENCOUNTER — Other Ambulatory Visit: Payer: Self-pay | Admitting: Radiation Oncology

## 2016-11-30 ENCOUNTER — Ambulatory Visit
Admission: RE | Admit: 2016-11-30 | Discharge: 2016-11-30 | Disposition: A | Discharge status: Home / Self Care | Payer: Medicare Other | Source: Ambulatory Visit | Attending: Radiation Oncology | Admitting: Radiation Oncology

## 2016-11-30 DIAGNOSIS — R928 Other abnormal and inconclusive findings on diagnostic imaging of breast: Secondary | ICD-10-CM

## 2016-11-30 DIAGNOSIS — N6310 Unspecified lump in the right breast, unspecified quadrant: Secondary | ICD-10-CM

## 2016-11-30 DIAGNOSIS — N641 Fat necrosis of breast: Secondary | ICD-10-CM | POA: Diagnosis not present

## 2016-11-30 HISTORY — PX: BREAST BIOPSY: SHX20

## 2016-12-03 LAB — SURGICAL PATHOLOGY

## 2017-02-05 IMAGING — MR MR CERVICAL SPINE W/O CM
5 series · 33 of 48 positions shown · non-contrast
Comparison: Brain MRI 02/28/2014.  Neck CT 05/15/2004.

CLINICAL DATA: 83-year-old female with cervical neck pain, left
upper extremity pain for 1 month and decreased range of motion.
Personal history of falls. Initial encounter.

EXAM:
MRI CERVICAL SPINE WITHOUT CONTRAST
TECHNIQUE: Multiplanar, multisequence MR imaging of the cervical spine was
performed. No intravenous contrast was administered.

[Series 2: T2 · sagittal · 3.0mm · 0.70mm/px · 7 of 13 slices shown (1 of 2)]
[im 1/13]
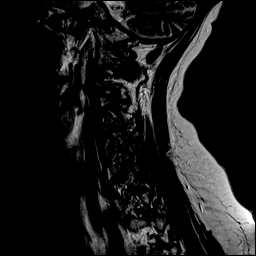
[im 3/13]
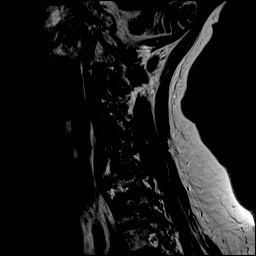
[im 5/13]
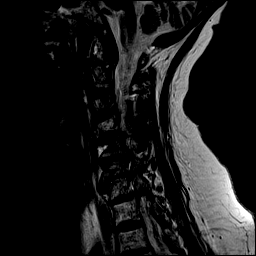
[im 7/13]
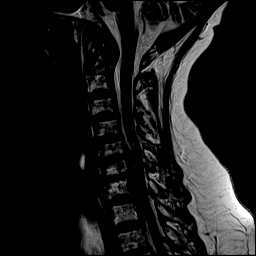
[im 9/13]
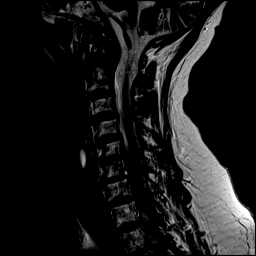
[im 11/13]
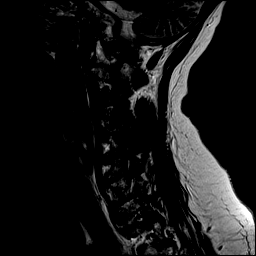
[im 13/13]
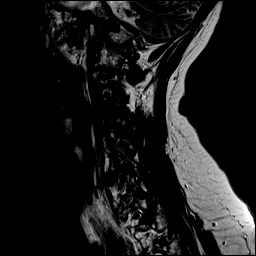

[Series 3: T1 · sagittal · 3.0mm · 0.70mm/px · 7 of 13 slices shown]
[im 1/13]
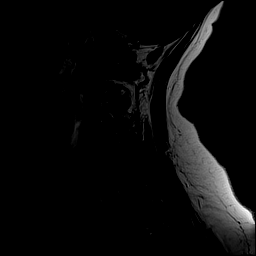
[im 3/13]
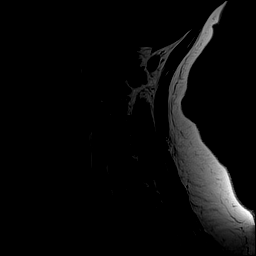
[im 5/13]
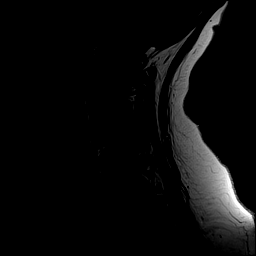
[im 7/13]
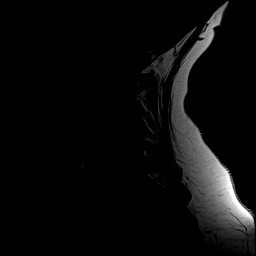
[im 9/13]
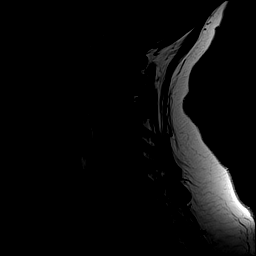
[im 11/13]
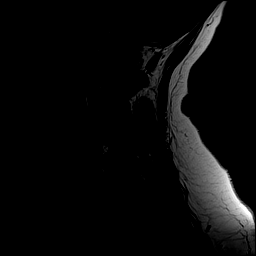
[im 13/13]
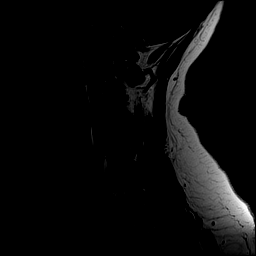

[Series 4: STIR · sagittal · 3.0mm · 0.35mm/px · 8 of 15 slices shown]
[im 1/15]
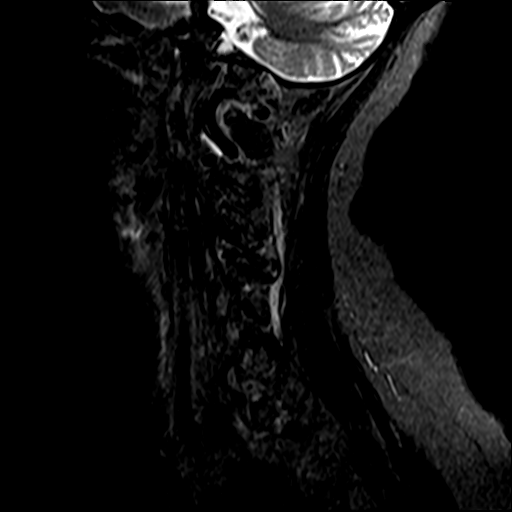
[im 3/15]
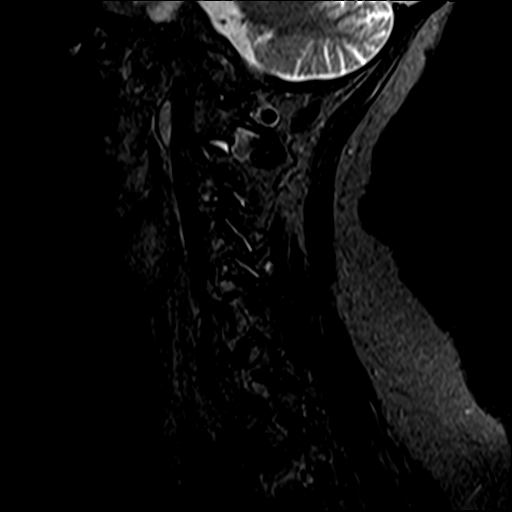
[im 5/15]
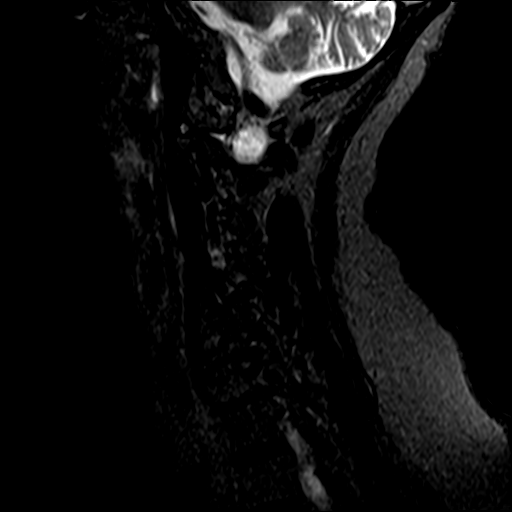
[im 7/15]
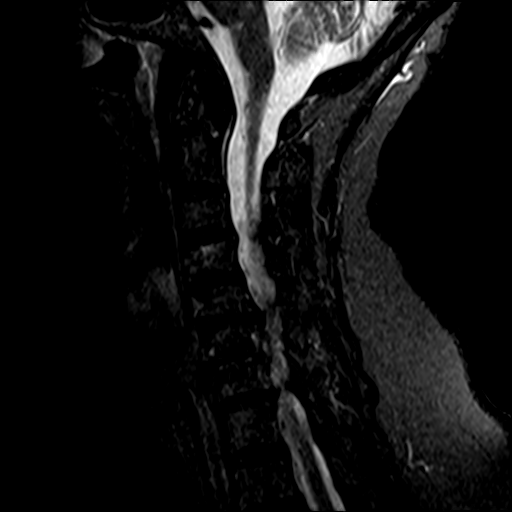
[im 9/15]
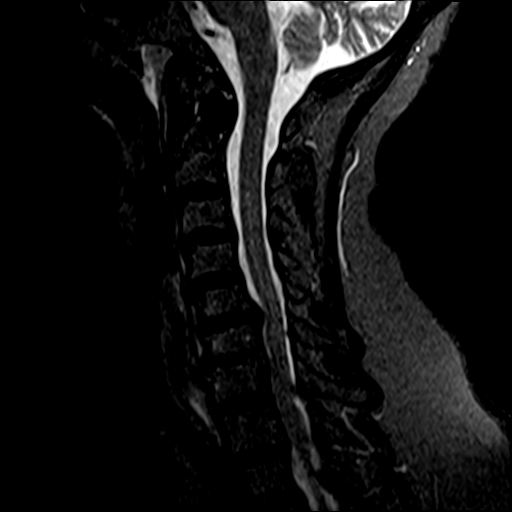
[im 11/15]
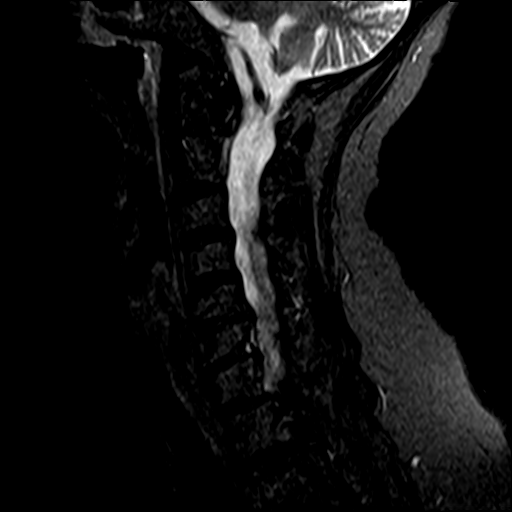
[im 13/15]
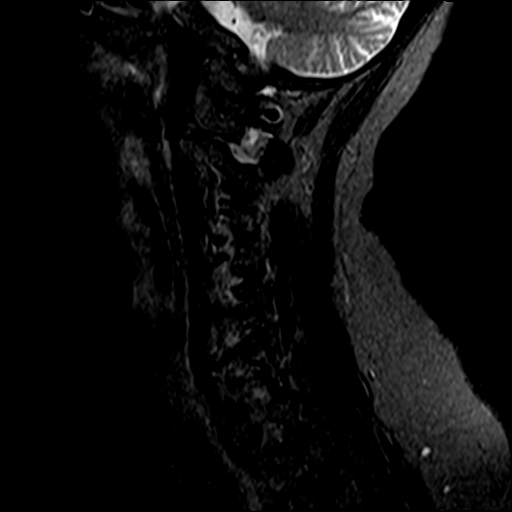
[im 15/15]
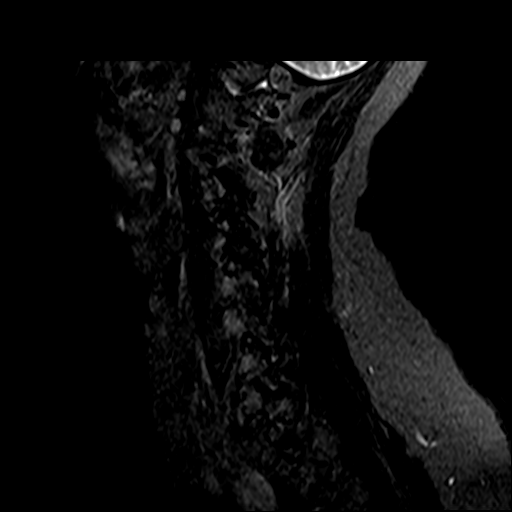

[Series 5: T2 · axial · 3.0mm · 0.70mm/px · z∈[-37,+55]mm · 9 of 25 slices shown (2 of 2)]
[im 1/25]
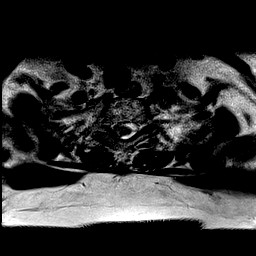
[im 5/25]
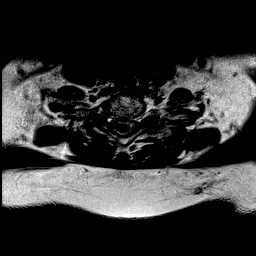
[im 9/25]
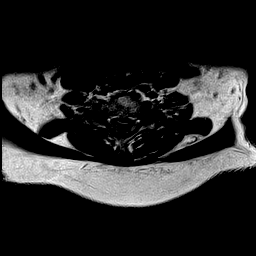
[im 11/25]
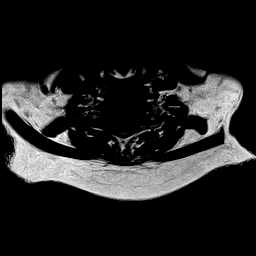
[im 13/25]
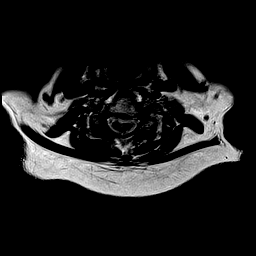
[im 15/25]
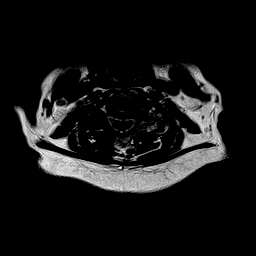
[im 17/25]
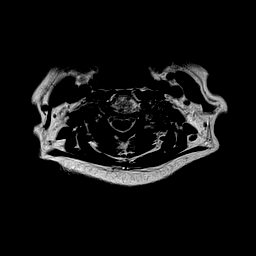
[im 21/25]
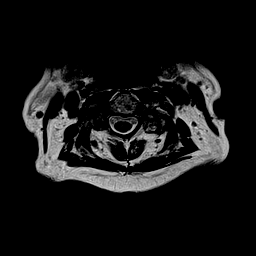
[im 25/25]
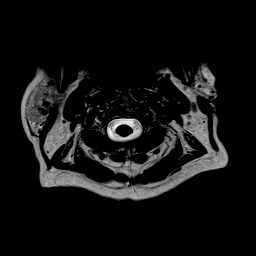

[Series 6: mpgr ax · axial · 3.0mm · 0.35mm/px · z∈[-37,-22]mm · 2 of 25 slices shown]
[im 1/25]
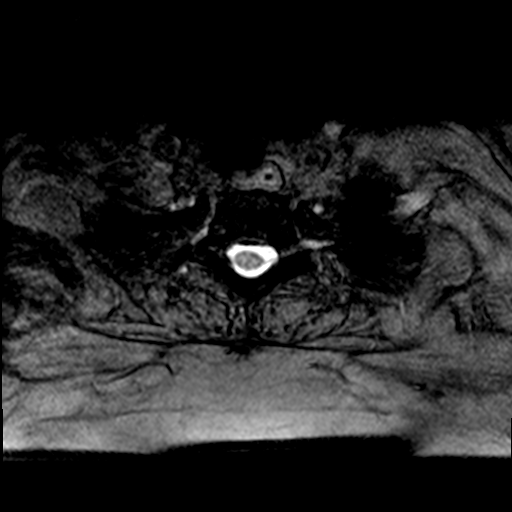
[im 5/25]
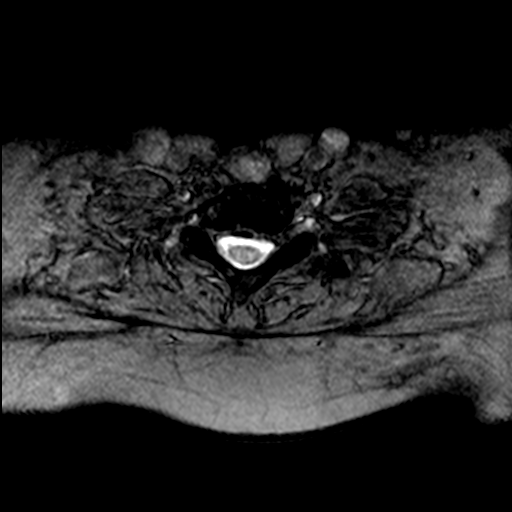

[33 of 48 positions shown; findings below may reference images not displayed]

FINDINGS: Alignment: Chronic multilevel cervical spondylolisthesis, including
1-2 mm of anterolisthesis from C2-C3 to C4-C5 which appears stable
since 8252. Underlying straightening of cervical lordosis.

Vertebrae: Multilevel moderate to severe facet arthropathy
associated with the chronic spondylolisthesis as detailed below. No
marrow edema or evidence of acute osseous abnormality.

Cord: No cervical spinal cord signal abnormality despite spinal
stenosis with spinal cord mass effect as described below.

Posterior Fossa, vertebral arteries, paraspinal tissues: Negative.

Disc levels:

C2-C3: Severe left and moderate right facet hypertrophy. Mild
uncovertebral hypertrophy. No spinal stenosis. Moderate to severe
left greater than right C3 foraminal stenosis.

C3-C4: Severe left and moderate to severe right facet hypertrophy.
Mild uncovertebral hypertrophy. Mild circumferential disc bulge with
superimposed small central disc protrusion (series 8, image 7).
Borderline to mild spinal stenosis, no cord mass effect. Severe left
greater than right C4 foraminal stenosis.

C4-C5: Severe left and moderate to severe right facet hypertrophy.
Ligament flavum hypertrophy. Mild disc bulge. Left greater than
right uncovertebral hypertrophy. No significant spinal stenosis.
Severe left and moderate to severe right C5 foraminal stenosis.

C5-C6: Bulky circumferential disc osteophyte complex with
broad-based left paracentral component (series 6, image 15). Spinal
stenosis with flattening of the ventral and left spinal cord. Bulky
uncovertebral hypertrophy with severe bilateral C6 foraminal
stenosis.

C6-C7: Disc space loss with bulky circumferential disc osteophyte
complex. Broad-based central posterior component (series 6, image
19). Mild ligament flavum hypertrophy. Spinal stenosis with no
definite spinal cord mass effect. Uncovertebral and lesser facet
hypertrophy with severe bilateral C7 foraminal stenosis.

C7-T1: Circumferential disc osteophyte complex eccentric to the
left. Moderate to severe ligament flavum hypertrophy.

Spinal stenosis with no definite spinal cord mass effect. Moderate
bilateral C8 foraminal stenosis.

Partially visible T1-T2 central disc protrusion. No definite upper
thoracic spinal stenosis.
IMPRESSION: 1. Severe cervical spine degeneration, including severe upper
cervical facet arthropathy in the setting of multilevel chronic
cervical spondylolisthesis.
2. Spinal stenosis with spinal cord mass effect at C5-C6, but no
associated cord signal abnormality. Spinal stenosis without spinal
cord mass effect at C3-C4, C6-C7, C7-T1.
3. Severe degenerative neural foraminal stenosis at the left C3,
bilateral C4, left C5, bilateral C6, and bilateral C7 nerve levels.
Moderate cervical foraminal stenosis elsewhere.

## 2017-03-26 DIAGNOSIS — Z66 Do not resuscitate: Secondary | ICD-10-CM | POA: Diagnosis present

## 2017-04-29 ENCOUNTER — Other Ambulatory Visit: Payer: Self-pay | Admitting: *Deleted

## 2017-04-29 ENCOUNTER — Other Ambulatory Visit: Payer: Self-pay | Admitting: Radiation Oncology

## 2017-04-29 DIAGNOSIS — C50411 Malignant neoplasm of upper-outer quadrant of right female breast: Secondary | ICD-10-CM

## 2017-04-29 DIAGNOSIS — Z171 Estrogen receptor negative status [ER-]: Principal | ICD-10-CM

## 2017-06-04 ENCOUNTER — Ambulatory Visit
Admission: RE | Admit: 2017-06-04 | Discharge: 2017-06-04 | Disposition: A | Discharge status: Home / Self Care | Payer: Medicare Other | Source: Ambulatory Visit | Attending: Radiation Oncology | Admitting: Radiation Oncology

## 2017-06-04 ENCOUNTER — Other Ambulatory Visit: Payer: Medicare Other

## 2017-06-04 DIAGNOSIS — N641 Fat necrosis of breast: Secondary | ICD-10-CM | POA: Insufficient documentation

## 2017-06-04 DIAGNOSIS — C50411 Malignant neoplasm of upper-outer quadrant of right female breast: Secondary | ICD-10-CM

## 2017-06-04 DIAGNOSIS — Z171 Estrogen receptor negative status [ER-]: Secondary | ICD-10-CM

## 2017-06-04 DIAGNOSIS — Z9889 Other specified postprocedural states: Secondary | ICD-10-CM | POA: Diagnosis not present

## 2017-06-04 HISTORY — DX: Personal history of irradiation: Z92.3

## 2017-10-24 ENCOUNTER — Ambulatory Visit: Payer: Medicare Other | Admitting: Radiation Oncology

## 2017-11-13 ENCOUNTER — Ambulatory Visit
Admission: RE | Admit: 2017-11-13 | Discharge: 2017-11-13 | Disposition: A | Discharge status: Home / Self Care | Payer: Medicare Other | Source: Ambulatory Visit | Attending: Radiation Oncology | Admitting: Radiation Oncology

## 2017-11-13 ENCOUNTER — Encounter: Payer: Self-pay | Admitting: Radiation Oncology

## 2017-11-13 ENCOUNTER — Other Ambulatory Visit: Payer: Self-pay

## 2017-11-13 VITALS — BP 172/86 | HR 85 | Temp 95.1°F | Wt 149.8 lb

## 2017-11-13 DIAGNOSIS — Z853 Personal history of malignant neoplasm of breast: Secondary | ICD-10-CM | POA: Diagnosis present

## 2017-11-13 DIAGNOSIS — C50411 Malignant neoplasm of upper-outer quadrant of right female breast: Secondary | ICD-10-CM

## 2017-11-13 DIAGNOSIS — Z171 Estrogen receptor negative status [ER-]: Secondary | ICD-10-CM

## 2017-11-13 DIAGNOSIS — Z923 Personal history of irradiation: Secondary | ICD-10-CM | POA: Diagnosis not present

## 2017-11-13 NOTE — Progress Notes (Signed)
Radiation Oncology Follow up Note  Name: [name]   Date:   11/13/2017 MRN:  [id number] DOB: [birth year]    This 84 y.o. female presents to the clinic today for 3 year follow-up status post whole breast radiation to her right breast for triple negative stage I invasive mammary carcinoma.  REFERRING PROVIDER: Dion Body, MD  HPI: patient is an 84 year old female now seen out 3 years having completed whole breast radiation to her right breast for triple negative invasive mammary carcinoma seen today in routine follow-up she is doing well. She specifically denies breast tenderness cough or bone pain..her last mammogram which I have reviewed was back in December was BI-RADS 2 benign. She is not on antiestrogen therapy based on her triple negative nature of her disease.  COMPLICATIONS OF TREATMENT: none  FOLLOW UP COMPLIANCE: keeps appointments   PHYSICAL EXAM:  BP (!) 172/86   Pulse 85   Temp (!) 95.1 F (35.1 C)   Wt 149 lb 12.8 oz (67.9 kg)   BMI 28.30 kg/m  Lungs are clear to A&P cardiac examination essentially unremarkable with regular rate and rhythm. No dominant mass or nodularity is noted in either breast in 2 positions examined. Incision is well-healed. No axillary or supraclavicular adenopathy is appreciated. Cosmetic result is excellent. Well-developed well-nourished patient in NAD. HEENT reveals PERLA, EOMI, discs not visualized.  Oral cavity is clear. No oral mucosal lesions are identified. Neck is clear without evidence of cervical or supraclavicular adenopathy. Lungs are clear to A&P. Cardiac examination is essentially unremarkable with regular rate and rhythm without murmur rub or thrill. Abdomen is benign with no organomegaly or masses noted. Motor sensory and DTR levels are equal and symmetric in the upper and lower extremities. Cranial nerves II through XII are grossly intact. Proprioception is intact. No peripheral adenopathy or edema is identified. No motor or  sensory levels are noted. Crude visual fields are within normal range.  RADIOLOGY RESULTS: mammograms are reviewed and compatible with the above-stated findings  PLAN: present time patient is doing well with no evidence of disease. Based on her age I'll see her one more year and then discontinue follow-up care. Patient is a rescheduled for follow-up mammograms. Patient is to call with any concerns.  I would like to take this opportunity to thank you for allowing me to participate in the care of your patient.Noreene Filbert, MD

## 2017-12-12 NOTE — Progress Notes (Signed)
11:43 AM   [name] 05/28/1932 [id number]  Referring provider: Dion Body, MD [facility] [place], [place] [address]  Chief Complaint  Patient presents with  . Recurrent UTI    HPI: Patient is an 84 year old Caucasian female with a history of recurrent UTI's, urge incontinence and vaginal atrophy who presents today for follow up.    Recurrent UTI's E.coli with variable resistant pattern in 03/21/2017 E.coli with variable resistant pattern in 09/22/2017 E.coli with variable resistant pattern in 10/25/2017  Risk factors for UTI's: age, vaginal atrophy, constipation/diarrhea and little water intake  Patient's daughter states that she is taking cranberry tablets daily, probiotics daily, Vitamin C daily (not sure of the mg), but she is only drinking 8 oz of water daily.    She was seen by Dr. Netty Starring on 12/11/2017 and UA was suspicious for infection.  She is currently on Macrobid.  Urine culture is pending.  Her symptoms at that time were dysuria, dribbling and feels suprapubic pressure.  Patient denies any gross hematuria, dysuria or suprapubic/flank pain.  Patient denies any fevers, chills, nausea or vomiting.     She is feeling some what better on the antibiotic.    Urge incontinence Today, she has been experiencing urgency x 4-7 (stable), frequency x 4-7 (stable), not restricting fluids to avoid visits to the restroom, is engaging in toilet mapping, incontinence x 8 (stable) and nocturia x 8 (worse).   Her PVR today is 22 mL.      PMH: Past Medical History:  Diagnosis Date  . Anxiety   . Breast cancer ([place]) 2015   right- radiation  . Cough   . Depression   . Diabetes mellitus without complication ([place])   . Gout   . HLD (hyperlipidemia)   . HTN (hypertension)   . Hypertension   . Interstitial lung disease ([place])   . Lumbar spinal stenosis   . Macular degeneration   . Migraines   . Osteoarthritis   . Personal history of  radiation therapy   . Phlebitis   . Pulmonary nodule   . Rheumatoid arthritis [facility])     Surgical History: Past Surgical History:  Procedure Laterality Date  . ABDOMINAL HYSTERECTOMY    . ANTERIOR CERVICAL DECOMP/DISCECTOMY FUSION N/A 04/04/2016   Procedure: ANTERIOR CERVICAL DECOMPRESSION/DISCECTOMY FUSION, INTERBODY PROSTHESIS,PLATE CERVICAL  FIVE-SIX,CERVICAL SIX-SEVEN,CERVICAL SEVEN-THORACIC ONE;  Surgeon: Newman Pies, MD;  Location: [place];  Service: Neurosurgery;  Laterality: N/A;  . BREAST BIOPSY Right 02/21/2015   neg  . BREAST BIOPSY Right 03/29/2016   neg  . BREAST BIOPSY Right 2015   +  . COLONOSCOPY    . ESOPHAGOGASTRODUODENOSCOPY    . MASTECTOMY, PARTIAL Right     Home Medications:  Allergies as of 12/13/2017      Reactions   Lisinopril Cough   Oxycodone-acetaminophen Other (See Comments)   Other reaction(s): Hallucination Caused AMS & hallucinations   Penicillins Other (See Comments)   Makes pt very "forgetful"   Clarithromycin Rash   Sulfa Antibiotics Rash      Medication List        Accurate as of 12/13/17 11:43 AM. Always use your most recent med list.          albuterol 108 (90 Base) MCG/ACT inhaler Commonly known as:  PROVENTIL HFA;VENTOLIN HFA Inhale 2 puffs into the lungs every 6 (six) hours as needed for wheezing or shortness of breath. Reported on 10/04/2015   allopurinol 300 MG tablet Commonly known as:  ZYLOPRIM Take 300 mg by mouth daily.   amLODipine 5 MG tablet Commonly known as:  NORVASC Take by mouth.   aspirin EC 325 MG tablet Take 325 mg by mouth daily.   busPIRone 5 MG tablet Commonly known as:  BUSPAR TAKE 1 TABLET BY MOUTH TWICE DAILY   Calcium Carbonate-Vitamin D3 600-400 MG-UNIT Tabs Take by mouth.   carvedilol 6.25 MG tablet Commonly known as:  COREG Take 6.25 mg by mouth 2 (two) times daily with a meal.   Cranberry 400 MG Caps Take 1 capsule by mouth 2 (two) times daily.   fluticasone 50 MCG/ACT nasal  spray Commonly known as:  FLONASE Place into the nose.   folic acid 706 MCG tablet Commonly known as:  FOLVITE Take 400 mcg by mouth daily.   HUMIRA 40 MG/0.8ML Pskt Generic drug:  Adalimumab Inject 40 mg into the skin every 21 ( twenty-one) days.   losartan 100 MG tablet Commonly known as:  COZAAR Take 100 mg by mouth daily.   lovastatin 40 MG tablet Commonly known as:  MEVACOR Take 40 mg by mouth at bedtime.   metFORMIN 500 MG tablet Commonly known as:  GLUCOPHAGE Take 500 mg by mouth 2 (two) times daily with a meal.   methotrexate 2.5 MG tablet Commonly known as:  RHEUMATREX TAKE 6 TABLETS(15 MG) BY MOUTH EVERY 7 DAYS   multivitamin with minerals Tabs tablet Take 1 tablet by mouth daily.   nitrofurantoin (macrocrystal-monohydrate) 100 MG capsule Commonly known as:  MACROBID   PARoxetine 30 MG tablet Commonly known as:  PAXIL Take 30 mg by mouth daily.       Allergies:  Allergies  Allergen Reactions  . Lisinopril Cough  . Oxycodone-Acetaminophen Other (See Comments)    Other reaction(s): Hallucination Caused AMS & hallucinations  . Penicillins Other (See Comments)    Makes pt very "forgetful"  . Clarithromycin Rash  . Sulfa Antibiotics Rash    Family History: Family History  Problem Relation Age of Onset  . Colon cancer Mother   . Stomach cancer Mother   . Esophageal cancer Mother   . COPD Father   . Colon polyps Sister   . Ovarian cancer Sister   . Kidney disease Neg Hx   . Bladder Cancer Neg Hx   . Kidney cancer Neg Hx   . Breast cancer Neg Hx     Social History:  reports that she has never smoked. She has never used smokeless tobacco. She reports that she does not drink alcohol or use drugs.  ROS: UROLOGY Frequent Urination?: No Hard to postpone urination?: No Burning/pain with urination?: Yes Get up at night to urinate?: Yes Leakage of urine?: Yes Urine stream starts and stops?: No Trouble starting stream?: No Do you have to strain  to urinate?: No Blood in urine?: No Urinary tract infection?: Yes Sexually transmitted disease?: No Injury to kidneys or bladder?: No Painful intercourse?: No Weak stream?: No Currently pregnant?: No Vaginal bleeding?: No Last menstrual period?: Hysterectomy  Gastrointestinal Nausea?: No Vomiting?: No Indigestion/heartburn?: No Diarrhea?: Yes Constipation?: No  Constitutional Fever: No Night sweats?: No Weight loss?: No Fatigue?: No  Skin Skin rash/lesions?: No Itching?: No  Eyes Blurred vision?: No Double vision?: No  Ears/Nose/Throat Sore throat?: No Sinus problems?: No  Hematologic/Lymphatic Swollen glands?: No Easy bruising?: No  Cardiovascular Leg swelling?: No Chest pain?: No  Respiratory Cough?: No Shortness of breath?: No  Endocrine Excessive thirst?: No  Musculoskeletal Back pain?: No Joint pain?: Yes  Neurological Headaches?: No Dizziness?: No  Psychologic Depression?: Yes Anxiety?: Yes  Physical Exam: BP (!) 175/76 (BP Location: Left Arm, Patient Position: Sitting, Cuff Size: Normal)   Pulse 77   Ht 5' 1.5" (1.562 m)   Wt 146 lb (66.2 kg)   BMI 27.14 kg/m   Constitutional: Well nourished. Alert and oriented, No acute distress. HEENT: [place] AT, moist mucus membranes. Trachea midline, no masses. Cardiovascular: No clubbing, cyanosis, or edema. Respiratory: Normal respiratory effort, no increased work of breathing. GI: Abdomen is soft, non tender, non distended, no abdominal masses. Liver and spleen not palpable.  No hernias appreciated.  Stool sample for occult testing is not indicated.   GU: No CVA tenderness.  No bladder fullness or masses.  Atrophic external genitalia, normal pubic hair distribution, no lesions.  Normal urethral meatus, no lesions, no prolapse, no discharge.   No urethral masses, tenderness and/or tenderness. No bladder fullness, tenderness or masses. Pale vagina mucosa, poor estrogen effect, no discharge, no  lesions, poor pelvic support, no cystocele or rectocele noted.  Cervix, uterus and adnexa are surgically absent.  Anus and perineum are without rashes or lesions.    Skin: No rashes, bruises or suspicious lesions. Lymph: No cervical or inguinal adenopathy. Neurologic: Grossly intact, no focal deficits, moving all 4 extremities. Psychiatric: Normal mood and affect.  Laboratory Data:  Lab Results  Component Value Date   WBC 6.6 03/27/2016   HGB 12.3 03/27/2016   HCT 37.9 03/27/2016   MCV 94.3 03/27/2016   PLT 207 03/27/2016    Lab Results  Component Value Date   CREATININE 0.73 03/27/2016     Lab Results  Component Value Date   HGBA1C 7.0 (H) 03/27/2016   I have reviewed the labs.  Pertinent Imaging Results for SHELVA, HETZER (MRN [id number]) as of 12/13/2017 11:33  Ref. Range 12/13/2017 11:09  Scan Result Unknown 60mL    Assessment & Plan:    1. History of recurrent UTI Reviewed UTI preventative strategies Asked the patient to contact us with symptoms of urinary tract infections Follow-up in one year  2. Mixed incontinence Discussed behavioral therapies, bladder training, bladder control strategies and pelvic floor therapy - she is not interested  Encouraged patient to drink more water Failed Myrbetriq and does not want to try anticholinergic therapy due to side effects I explained the PTNS provides treatment by indirectly providing electrical stimulation to the nerves responsible for bladder and pelvic floor function - a needle electrode generates an adjustable electrical pulse that travels to the sacral plexus via the tibial nerve which is located in the ankle, among other functions, the sacral nerve plexus regulates bladder and pelvic floor function - treatment protocol requires once-a-week treatments for 12 weeks, 30 minutes per session and many patients begin to see improvements by the 6th treatment. Patients who respond to treatment may require occasional treatments  (~ once every 3 weeks) to sustain improvements. PTNS is a low-risk procedure. The most common side-effects with PTNS treatment are temporary and minor, resulting from the placement of the needle electrode. They include minor bleeding, mild pain and skin inflammation and patients have seen up to an 80% success rate with this form of treatment RTC for PTNS  3. Vaginal Atrophy-  Not a candidate for vaginal estrogen d/t h/o breast cancer. Patient using coconut oil as a vaginal moisturizer.  Return for schedule PTNS treatments .  These notes generated with voice recognition software. I apologize for typographical errors.  Joshaua Epple, PA-C  [place], [place] [address] [phone]

## 2017-12-13 ENCOUNTER — Ambulatory Visit (INDEPENDENT_AMBULATORY_CARE_PROVIDER_SITE_OTHER): Payer: Medicare Other | Admitting: Urology

## 2017-12-13 ENCOUNTER — Encounter: Payer: Self-pay | Admitting: Urology

## 2017-12-13 VITALS — BP 175/76 | HR 77 | Ht 61.5 in | Wt 146.0 lb

## 2017-12-13 DIAGNOSIS — N952 Postmenopausal atrophic vaginitis: Secondary | ICD-10-CM | POA: Diagnosis not present

## 2017-12-13 DIAGNOSIS — Z87440 Personal history of urinary (tract) infections: Secondary | ICD-10-CM

## 2017-12-13 DIAGNOSIS — N3946 Mixed incontinence: Secondary | ICD-10-CM

## 2017-12-13 LAB — BLADDER SCAN AMB NON-IMAGING

## 2017-12-13 NOTE — Patient Instructions (Signed)

## 2017-12-17 ENCOUNTER — Other Ambulatory Visit: Payer: Self-pay

## 2018-01-07 ENCOUNTER — Telehealth: Payer: Self-pay | Admitting: Urology

## 2018-01-07 NOTE — Telephone Encounter (Signed)
Called the patient and spoke to her daughter, Amy.  She will talk to the patient about PTNS treatments and scheduling 12 visits and call the office back to schedule.

## 2018-01-07 NOTE — Telephone Encounter (Signed)
PTNS- 12 visits Approved by patient's insurance [facility] Medicare Complete) [phone].  Spoke to [place].  Prior authorization is not required.  Ref # [id number]

## 2018-05-14 ENCOUNTER — Other Ambulatory Visit: Payer: Self-pay | Admitting: *Deleted

## 2018-05-14 DIAGNOSIS — C50411 Malignant neoplasm of upper-outer quadrant of right female breast: Secondary | ICD-10-CM

## 2018-05-14 DIAGNOSIS — Z171 Estrogen receptor negative status [ER-]: Principal | ICD-10-CM

## 2018-06-05 ENCOUNTER — Ambulatory Visit
Admission: RE | Admit: 2018-06-05 | Discharge: 2018-06-05 | Disposition: A | Discharge status: Home / Self Care | Payer: Medicare Other | Source: Ambulatory Visit | Attending: Radiation Oncology | Admitting: Radiation Oncology

## 2018-06-05 DIAGNOSIS — C50411 Malignant neoplasm of upper-outer quadrant of right female breast: Secondary | ICD-10-CM | POA: Diagnosis present

## 2018-06-05 DIAGNOSIS — Z171 Estrogen receptor negative status [ER-]: Secondary | ICD-10-CM | POA: Insufficient documentation

## 2018-08-02 DIAGNOSIS — J189 Pneumonia, unspecified organism: Secondary | ICD-10-CM

## 2018-08-02 HISTORY — DX: Pneumonia, unspecified organism: J18.9

## 2018-08-04 ENCOUNTER — Emergency Department: Payer: Medicare Other

## 2018-08-04 ENCOUNTER — Encounter: Payer: Self-pay | Admitting: Emergency Medicine

## 2018-08-04 ENCOUNTER — Other Ambulatory Visit: Payer: Self-pay

## 2018-08-04 ENCOUNTER — Observation Stay
Admission: EM | Admit: 2018-08-04 | Discharge: 2018-08-05 | Disposition: A | Discharge status: Home / Self Care | Payer: Medicare Other | Attending: Internal Medicine | Admitting: Internal Medicine

## 2018-08-04 DIAGNOSIS — M069 Rheumatoid arthritis, unspecified: Secondary | ICD-10-CM | POA: Insufficient documentation

## 2018-08-04 DIAGNOSIS — Z888 Allergy status to other drugs, medicaments and biological substances status: Secondary | ICD-10-CM | POA: Insufficient documentation

## 2018-08-04 DIAGNOSIS — J189 Pneumonia, unspecified organism: Principal | ICD-10-CM | POA: Insufficient documentation

## 2018-08-04 DIAGNOSIS — D72829 Elevated white blood cell count, unspecified: Secondary | ICD-10-CM | POA: Insufficient documentation

## 2018-08-04 DIAGNOSIS — I452 Bifascicular block: Secondary | ICD-10-CM | POA: Diagnosis not present

## 2018-08-04 DIAGNOSIS — Z79899 Other long term (current) drug therapy: Secondary | ICD-10-CM | POA: Diagnosis not present

## 2018-08-04 DIAGNOSIS — K529 Noninfective gastroenteritis and colitis, unspecified: Secondary | ICD-10-CM | POA: Diagnosis not present

## 2018-08-04 DIAGNOSIS — M109 Gout, unspecified: Secondary | ICD-10-CM | POA: Insufficient documentation

## 2018-08-04 DIAGNOSIS — Z880 Allergy status to penicillin: Secondary | ICD-10-CM | POA: Insufficient documentation

## 2018-08-04 DIAGNOSIS — J90 Pleural effusion, not elsewhere classified: Secondary | ICD-10-CM | POA: Diagnosis not present

## 2018-08-04 DIAGNOSIS — F329 Major depressive disorder, single episode, unspecified: Secondary | ICD-10-CM | POA: Insufficient documentation

## 2018-08-04 DIAGNOSIS — Z7982 Long term (current) use of aspirin: Secondary | ICD-10-CM | POA: Insufficient documentation

## 2018-08-04 DIAGNOSIS — I119 Hypertensive heart disease without heart failure: Secondary | ICD-10-CM | POA: Diagnosis not present

## 2018-08-04 DIAGNOSIS — Z882 Allergy status to sulfonamides status: Secondary | ICD-10-CM | POA: Insufficient documentation

## 2018-08-04 DIAGNOSIS — E119 Type 2 diabetes mellitus without complications: Secondary | ICD-10-CM | POA: Diagnosis not present

## 2018-08-04 DIAGNOSIS — J9601 Acute respiratory failure with hypoxia: Secondary | ICD-10-CM | POA: Insufficient documentation

## 2018-08-04 DIAGNOSIS — F419 Anxiety disorder, unspecified: Secondary | ICD-10-CM | POA: Diagnosis not present

## 2018-08-04 DIAGNOSIS — Z66 Do not resuscitate: Secondary | ICD-10-CM | POA: Diagnosis not present

## 2018-08-04 DIAGNOSIS — J181 Lobar pneumonia, unspecified organism: Secondary | ICD-10-CM

## 2018-08-04 DIAGNOSIS — E785 Hyperlipidemia, unspecified: Secondary | ICD-10-CM | POA: Diagnosis not present

## 2018-08-04 DIAGNOSIS — Z7984 Long term (current) use of oral hypoglycemic drugs: Secondary | ICD-10-CM | POA: Diagnosis not present

## 2018-08-04 LAB — COMPREHENSIVE METABOLIC PANEL
ALBUMIN: 3.9 g/dL (ref 3.5–5.0)
ALT: 15 U/L (ref 0–44)
ANION GAP: 10 (ref 5–15)
AST: 24 U/L (ref 15–41)
Alkaline Phosphatase: 44 U/L (ref 38–126)
BUN: 13 mg/dL (ref 8–23)
CHLORIDE: 99 mmol/L (ref 98–111)
CO2: 22 mmol/L (ref 22–32)
Calcium: 8.8 mg/dL — ABNORMAL LOW (ref 8.9–10.3)
Creatinine, Ser: 0.61 mg/dL (ref 0.44–1.00)
GFR calc Af Amer: >60 mL/min (ref >60)
Glucose, Bld: 171 mg/dL — ABNORMAL HIGH (ref 70–99)
POTASSIUM: 3.9 mmol/L (ref 3.5–5.1)
Sodium: 131 mmol/L — ABNORMAL LOW (ref 135–145)
TOTAL PROTEIN: 7.2 g/dL (ref 6.5–8.1)
Total Bilirubin: 0.9 mg/dL (ref 0.3–1.2)

## 2018-08-04 LAB — CBC WITH DIFFERENTIAL/PLATELET
Abs Immature Granulocytes: 0.09 10*3/uL — ABNORMAL HIGH (ref 0.00–0.07)
BASOS ABS: 0 10*3/uL (ref 0.0–0.1)
BASOS PCT: 0 %
EOS ABS: 0 10*3/uL (ref 0.0–0.5)
EOS PCT: 0 %
HCT: 34.8 % — ABNORMAL LOW (ref 36.0–46.0)
Hemoglobin: 11.6 g/dL — ABNORMAL LOW (ref 12.0–15.0)
IMMATURE GRANULOCYTES: 1 %
Lymphocytes Relative: 10 %
Lymphs Abs: 1.6 10*3/uL (ref 0.7–4.0)
MCH: 30 pg (ref 26.0–34.0)
MCHC: 33.3 g/dL (ref 30.0–36.0)
MCV: 89.9 fL (ref 80.0–100.0)
Monocytes Absolute: 1.4 10*3/uL — ABNORMAL HIGH (ref 0.1–1.0)
Monocytes Relative: 9 %
NEUTROS PCT: 80 %
NRBC: 0 % (ref 0.0–0.2)
Neutro Abs: 12.7 10*3/uL — ABNORMAL HIGH (ref 1.7–7.7)
PLATELETS: 167 10*3/uL (ref 150–400)
RBC: 3.87 MIL/uL (ref 3.87–5.11)
RDW: 15 % (ref 11.5–15.5)
WBC: 15.9 10*3/uL — ABNORMAL HIGH (ref 4.0–10.5)

## 2018-08-04 LAB — INFLUENZA PANEL BY PCR (TYPE A & B)
INFLAPCR: NEGATIVE
INFLBPCR: NEGATIVE

## 2018-08-04 LAB — HEMOGLOBIN A1C
Hgb A1c MFr Bld: 7.1 % — ABNORMAL HIGH (ref 4.8–5.6)
Mean Plasma Glucose: 157.07 mg/dL

## 2018-08-04 LAB — GLUCOSE, CAPILLARY: Glucose-Capillary: 177 mg/dL — ABNORMAL HIGH (ref 70–99)

## 2018-08-04 LAB — LIPASE, BLOOD: LIPASE: 46 U/L (ref 11–51)

## 2018-08-04 MED ORDER — ENOXAPARIN SODIUM 40 MG/0.4ML ~~LOC~~ SOLN
40.0000 mg | SUBCUTANEOUS | Status: DC
  Administered 2018-08-04: 22:00:00 40 mg via SUBCUTANEOUS
  Filled 2018-08-04: qty 0.4

## 2018-08-04 MED ORDER — INSULIN ASPART 100 UNIT/ML ~~LOC~~ SOLN: 0.0000 [IU] | Freq: Every day | SUBCUTANEOUS | Status: DC

## 2018-08-04 MED ORDER — INSULIN ASPART 100 UNIT/ML ~~LOC~~ SOLN
0.0000 [IU] | Freq: Three times a day (TID) | SUBCUTANEOUS | Status: DC
  Administered 2018-08-05: 08:00:00 2 [IU] via SUBCUTANEOUS
  Filled 2018-08-04: qty 1

## 2018-08-04 MED ORDER — ACETAMINOPHEN 325 MG PO TABS: 650.0000 mg | ORAL_TABLET | Freq: Four times a day (QID) | ORAL | Status: DC | PRN

## 2018-08-04 MED ORDER — METHYLPREDNISOLONE SODIUM SUCC 125 MG IJ SOLR
60.0000 mg | INTRAMUSCULAR | Status: DC
  Administered 2018-08-04: 60 mg via INTRAVENOUS
  Filled 2018-08-04: qty 2

## 2018-08-04 MED ORDER — ALBUTEROL SULFATE (2.5 MG/3ML) 0.083% IN NEBU: 2.5000 mg | INHALATION_SOLUTION | RESPIRATORY_TRACT | Status: DC | PRN

## 2018-08-04 MED ORDER — ACETAMINOPHEN 650 MG RE SUPP: 650.0000 mg | Freq: Four times a day (QID) | RECTAL | Status: DC | PRN

## 2018-08-04 MED ORDER — ONDANSETRON HCL 4 MG PO TABS: 4.0000 mg | ORAL_TABLET | Freq: Four times a day (QID) | ORAL | Status: DC | PRN

## 2018-08-04 MED ORDER — ALBUTEROL SULFATE (2.5 MG/3ML) 0.083% IN NEBU
2.5000 mg | INHALATION_SOLUTION | Freq: Once | RESPIRATORY_TRACT | Status: AC
  Administered 2018-08-04: 2.5 mg via RESPIRATORY_TRACT
  Filled 2018-08-04: qty 3

## 2018-08-04 MED ORDER — AZITHROMYCIN 500 MG IV SOLR
500.0000 mg | INTRAVENOUS | Status: DC
  Administered 2018-08-04: 500 mg via INTRAVENOUS
  Filled 2018-08-04 (×2): qty 500

## 2018-08-04 MED ORDER — ONDANSETRON HCL 4 MG/2ML IJ SOLN
4.0000 mg | Freq: Once | INTRAMUSCULAR | Status: AC
  Administered 2018-08-04: 4 mg via INTRAVENOUS
  Filled 2018-08-04: qty 2

## 2018-08-04 MED ORDER — ONDANSETRON HCL 4 MG/2ML IJ SOLN
4.0000 mg | Freq: Four times a day (QID) | INTRAMUSCULAR | Status: DC | PRN
  Administered 2018-08-04: 4 mg via INTRAVENOUS
  Filled 2018-08-04: qty 2

## 2018-08-04 MED ORDER — SODIUM CHLORIDE 0.9 % IV SOLN
2.0000 g | INTRAVENOUS | Status: DC
  Administered 2018-08-04: 2 g via INTRAVENOUS
  Filled 2018-08-04 (×2): qty 20

## 2018-08-04 MED ORDER — SODIUM CHLORIDE 0.9 % IV BOLUS
250.0000 mL | Freq: Once | INTRAVENOUS | Status: AC
  Administered 2018-08-04: 250 mL via INTRAVENOUS

## 2018-08-04 NOTE — ED Provider Notes (Signed)
[facility] Emergency Department Provider Note    First MD Initiated Contact with Patient 08/04/18 1151     (approximate)  I have reviewed the triage vital signs and the nursing notes.   HISTORY  Chief Complaint Fatigue    HPI Taliaferro is a 85 y.o. female presents from home with chief complaint of nausea vomiting loose diarrhea as well as nonproductive cough.  Has been having fevers and chills at home but no measured temperature.  Denies any headache.  No recent antibiotics.  No recent admission to the hospital.  Has been diagnosed with bronchitis in the past but no known history of COPD or asthma.  Denies any history of congestive heart failure.    Past Medical History:  Diagnosis Date  . Anxiety   . Breast cancer ([place]) 2015   right- radiation  . Cough   . Depression   . Diabetes mellitus without complication ([place])   . Gout   . HLD (hyperlipidemia)   . HTN (hypertension)   . Hypertension   . Interstitial lung disease ([place])   . Lumbar spinal stenosis   . Macular degeneration   . Migraines   . Osteoarthritis   . Personal history of radiation therapy   . Phlebitis   . Pulmonary nodule   . Rheumatoid arthritis ([place])    Family History  Problem Relation Age of Onset  . Colon cancer Mother   . Stomach cancer Mother   . Esophageal cancer Mother   . COPD Father   . Colon polyps Sister   . Ovarian cancer Sister   . Kidney disease Neg Hx   . Bladder Cancer Neg Hx   . Kidney cancer Neg Hx   . Breast cancer Neg Hx    Past Surgical History:  Procedure Laterality Date  . ABDOMINAL HYSTERECTOMY    . ANTERIOR CERVICAL DECOMP/DISCECTOMY FUSION N/A 04/04/2016   Procedure: ANTERIOR CERVICAL DECOMPRESSION/DISCECTOMY FUSION, INTERBODY PROSTHESIS,PLATE CERVICAL  FIVE-SIX,CERVICAL SIX-SEVEN,CERVICAL SEVEN-THORACIC ONE;  Surgeon: Newman Pies, MD;  Location: [facility];  Service: Neurosurgery;  Laterality: N/A;  . BREAST BIOPSY Right 02/21/2015   CALCIFICATION INVOLVING HYALINIZED STROMA AND BENIGN DUCTS  . BREAST BIOPSY Right 03/29/2016   FAT NECROSIS WITH CALCIFICATIONS.   Marland Kitchen BREAST BIOPSY Right 2015   + invasive mam ca  . BREAST BIOPSY Right 11/30/2016   BENIGN BREAST TISSUE WITH ORGANIZING FAT NECROSIS AND CHANGES   . COLONOSCOPY    . ESOPHAGOGASTRODUODENOSCOPY    . MASTECTOMY, PARTIAL Right    Patient Active Problem List   Diagnosis Date Noted  . Malignant neoplasm of upper-outer quadrant of right breast in female, estrogen receptor negative ([place]) 04/29/2017  . Cervical spondylosis with radiculopathy 04/04/2016  . Recurrent UTI 10/06/2015  . Vaginal atrophy 10/06/2015  . Diabetes mellitus, type 2 ([place]) 08/02/2015  . ILD (interstitial lung disease) ([place]) 08/02/2015  . Headache, migraine 08/02/2015  . Inflammation of a vein 08/02/2015  . Anxiety and depression 10/15/2013  . Essential (primary) hypertension 10/15/2013  . Gout 10/15/2013  . Degeneration macular 10/15/2013  . Combined fat and carbohydrate induced hyperlipemia 10/15/2013  . Arthritis, degenerative 10/15/2013  . Arthritis or polyarthritis, rheumatoid ([place]) 10/15/2013      Prior to Admission medications   Medication Sig Start Date End Date Taking? Authorizing Provider  Adalimumab (HUMIRA) 40 MG/0.8ML PSKT Inject 40 mg into the skin every 21 ( twenty-one) days.   Yes [provider]  allopurinol (ZYLOPRIM) 300 MG tablet Take 300 mg by  mouth daily.   Yes [provider]  amLODipine (NORVASC) 5 MG tablet Take by mouth. 08/12/17 08/12/18 Yes [provider]  Ascorbic Acid (VITAMIN C) 1000 MG tablet Take 1,000 mg by mouth 2 (two) times daily.   Yes [provider]  aspirin EC 325 MG tablet Take 325 mg by mouth daily.    Yes [provider]  busPIRone (BUSPAR) 10 MG tablet Take 10 mg by mouth 2 (two) times daily.  09/03/16  Yes [provider]  carvedilol (COREG) 6.25 MG tablet Take 6.25 mg by mouth 2 (two) times  daily with a meal.   Yes [provider]  Cranberry 400 MG CAPS Take 1 capsule by mouth 2 (two) times daily.   Yes [provider]  folic acid (FOLVITE) 563 MCG tablet Take 400 mcg by mouth daily.   Yes [provider]  losartan (COZAAR) 100 MG tablet Take 100 mg by mouth daily.   Yes [provider]  lovastatin (MEVACOR) 40 MG tablet Take 40 mg by mouth at bedtime.   Yes [provider]  metFORMIN (GLUCOPHAGE) 500 MG tablet Take 1,000 mg by mouth 2 (two) times daily with a meal.    Yes [provider]  methotrexate 50 MG/2ML injection Inject 15 mg as directed once a week. 04/07/18  Yes [provider]  Multiple Vitamin (MULTIVITAMIN WITH MINERALS) TABS tablet Take 1 tablet by mouth daily.   Yes [provider]  Multiple Vitamins-Minerals (PRESERVISION/LUTEIN) CAPS Take 1 capsule by mouth daily.   Yes [provider]  PARoxetine (PAXIL) 30 MG tablet Take 30 mg by mouth daily.    Yes [provider]  nitrofurantoin, macrocrystal-monohydrate, (MACROBID) 100 MG capsule  12/11/17   [provider]    Allergies Lisinopril; Oxycodone-acetaminophen; Penicillins; Clarithromycin; and Sulfa antibiotics    Social History Social History   Tobacco Use  . Smoking status: Never Smoker  . Smokeless tobacco: Never Used  Substance Use Topics  . Alcohol use: No    Alcohol/week: 0.0 standard drinks  . Drug use: No    Review of Systems Patient denies headaches, rhinorrhea, blurry vision, numbness, shortness of breath, chest pain, edema, cough, abdominal pain, nausea, vomiting, diarrhea, dysuria, fevers, rashes or hallucinations unless otherwise stated above in HPI. ____________________________________________   PHYSICAL EXAM:  VITAL SIGNS: Vitals:   08/04/18 1155 08/04/18 1300  BP: 140/67 129/62  Pulse: 92 76  Resp: 16 18  Temp: 99.7 F (37.6 C)   SpO2: 95% 98%    Constitutional: Alert and  oriented.  Eyes: Conjunctivae are normal.  Head: Atraumatic. Nose: No congestion/rhinnorhea. Mouth/Throat: Mucous membranes are moist.   Neck: No stridor. Painless ROM.  Cardiovascular: Normal rate, regular rhythm. Grossly normal heart sounds.  Good peripheral circulation. Respiratory: Normal respiratory effort.  No retractions. Lungs coarse bibasilar breath sounds and occasional wheeze. Gastrointestinal: Soft and nontender. No distention. No abdominal bruits. No CVA tenderness. Genitourinary:  Musculoskeletal: No lower extremity tenderness nor edema.  No joint effusions. Neurologic:  Normal speech and language. No gross focal neurologic deficits are appreciated. No facial droop Skin:  Skin is warm, dry and intact. No rash noted. Psychiatric: Mood and affect are normal. Speech and behavior are normal.  ____________________________________________   LABS (all labs ordered are listed, but only abnormal results are displayed)  Results for orders placed or performed during the hospital encounter of 08/04/18 (from the past 24 hour(s))  CBC with Differential/Platelet     Status: Abnormal  Collection Time: 08/04/18 11:56 AM  Result Value Ref Range   WBC 15.9 (H) 4.0 - 10.5 K/uL   RBC 3.87 3.87 - 5.11 MIL/uL   Hemoglobin 11.6 (L) 12.0 - 15.0 g/dL   HCT 34.8 (L) 36.0 - 46.0 %   MCV 89.9 80.0 - 100.0 fL   MCH 30.0 26.0 - 34.0 pg   MCHC 33.3 30.0 - 36.0 g/dL   RDW 15.0 11.5 - 15.5 %   Platelets 167 150 - 400 K/uL   nRBC 0.0 0.0 - 0.2 %   Neutrophils Relative % 80 %   Neutro Abs 12.7 (H) 1.7 - 7.7 K/uL   Lymphocytes Relative 10 %   Lymphs Abs 1.6 0.7 - 4.0 K/uL   Monocytes Relative 9 %   Monocytes Absolute 1.4 (H) 0.1 - 1.0 K/uL   Eosinophils Relative 0 %   Eosinophils Absolute 0.0 0.0 - 0.5 K/uL   Basophils Relative 0 %   Basophils Absolute 0.0 0.0 - 0.1 K/uL   Immature Granulocytes 1 %   Abs Immature Granulocytes 0.09 (H) 0.00 - 0.07 K/uL  Comprehensive metabolic panel      Status: Abnormal   Collection Time: 08/04/18 11:56 AM  Result Value Ref Range   Sodium 131 (L) 135 - 145 mmol/L   Potassium 3.9 3.5 - 5.1 mmol/L   Chloride 99 98 - 111 mmol/L   CO2 22 22 - 32 mmol/L   Glucose, Bld 171 (H) 70 - 99 mg/dL   BUN 13 8 - 23 mg/dL   Creatinine, Ser 0.61 0.44 - 1.00 mg/dL   Calcium 8.8 (L) 8.9 - 10.3 mg/dL   Total Protein 7.2 6.5 - 8.1 g/dL   Albumin 3.9 3.5 - 5.0 g/dL   AST 24 15 - 41 U/L   ALT 15 0 - 44 U/L   Alkaline Phosphatase 44 38 - 126 U/L   Total Bilirubin 0.9 0.3 - 1.2 mg/dL   GFR calc non Af Amer >60 >60 mL/min   GFR calc Af Amer >60 >60 mL/min   Anion gap 10 5 - 15  Lipase, blood     Status: None   Collection Time: 08/04/18 11:56 AM  Result Value Ref Range   Lipase 46 11 - 51 U/L  Influenza panel by PCR (type A & B)     Status: None   Collection Time: 08/04/18  1:36 PM  Result Value Ref Range   Influenza A By PCR NEGATIVE NEGATIVE   Influenza B By PCR NEGATIVE NEGATIVE   ____________________________________________  EKG My review and personal interpretation at Time: 12:47   Indication: N/v  Rate: 80  Rhythm: sinus Axis: normal Other: rbbb and lafb, no stemi, nonsepcific st abn, abnml ekg ____________________________________________  RADIOLOGY  ,I personally reviewed all radiographic images ordered to evaluate for the above acute complaints and reviewed radiology reports and findings.  These findings were personally discussed with the patient.  Please see medical record for radiology report.  ____________________________________________   PROCEDURES  Procedure(s) performed:  .Critical Care Performed by: Merlyn Lot, MD Authorized by: Merlyn Lot, MD   Critical care provider statement:    Critical care time (minutes):  30   Critical care time was exclusive of:  Separately billable procedures and treating other patients   Critical care was necessary to treat or prevent imminent or life-threatening deterioration of  the following conditions:  Respiratory failure   Critical care was time spent personally by me on the following activities:  Development of treatment  plan with patient or surrogate, discussions with consultants, evaluation of patient's response to treatment, examination of patient, obtaining history from patient or surrogate, ordering and performing treatments and interventions, ordering and review of laboratory studies, ordering and review of radiographic studies, pulse oximetry, re-evaluation of patient's condition and review of old charts      Critical Care performed: yes ____________________________________________   INITIAL IMPRESSION / ASSESSMENT AND PLAN / ED COURSE  Pertinent labs & imaging results that were available during my care of the patient were reviewed by me and considered in my medical decision making (see chart for details).   DDX: pna, bronchitis, enteritis, influenza, chf, copd  Fareeda L Lessner is a 85 y.o. who presents to the ED with nausea vomiting weakness as described above.  Patient with low-grade temperature here and noted to be hypoxic on room air to 88%.  She is placed on supplemental oxygen due to respiratory distress.  Did have improvement in her O2 saturations.  Her abdominal exam is benign.  Will order x-ray to evaluate for obstructive pattern.  Blood work will be ordered.  Will give IV hydration she does appear slightly dehydrated.  The patient will be placed on continuous pulse oximetry and telemetry for monitoring.  Laboratory evaluation will be sent to evaluate for the above complaints.     Clinical Course as of Aug 05 1423  Mon Aug 04, 2018  1403 Repeat abdominal exam is soft and benign.  Does seem more clinically consistent with pneumonia.   [PR]  1423 Flu is negative.  Has the patient is hypoxic on room air at 88% will discuss with hospitalist for admission for further medical management.   [PR]    Clinical Course User Index [PR] Merlyn Lot,  MD     As part of my medical decision making, I reviewed the following data within the [place] notes reviewed and incorporated, Labs reviewed, notes from prior ED visits and [place] Controlled Substance Database   ____________________________________________   FINAL CLINICAL IMPRESSION(S) / ED DIAGNOSES  Final diagnoses:  Acute respiratory failure with hypoxia ([place])  Pneumonia of left lower lobe due to infectious organism [facility])      NEW MEDICATIONS STARTED DURING THIS VISIT:  New Prescriptions   No medications on file     Note:  This document was prepared using Dragon voice recognition software and may include unintentional dictation errors.    Merlyn Lot, MD 08/04/18 1425

## 2018-08-04 NOTE — Plan of Care (Signed)

## 2018-08-04 NOTE — Progress Notes (Signed)
Advance care planning  Purpose of Encounter Pneumonia  Parties in Attendance Patient and her granddaughter is at bedside  Patients Decisional capacity Alert and oriented.  Able to make medical decisions.  Documented healthcare power of attorney is [name].  Discussed with patient and her granddaughter regarding pneumonia, need for admission and IV antibiotics.  All questions answered  CODE STATUS discussed and patient wishes to be DO NOT RESUSCITATE and DO NOT INTUBATE.  Orders entered.  CODE STATUS changed.  Time spent - 17 minutes

## 2018-08-04 NOTE — H&P (Signed)
[place] [facility] [place] NAME: [name]    MR#:  [id number]  DATE OF BIRTH:  Aug 19, 1931  DATE OF ADMISSION:  08/04/2018  PRIMARY CARE PHYSICIAN: Dion Body, MD   REQUESTING/REFERRING PHYSICIAN: Dr. Quentin Cornwall  CHIEF COMPLAINT:   Chief Complaint  Patient presents with  . Fatigue    HISTORY OF PRESENT ILLNESS:  [name]  is a 85 y.o. female with a known history of hypertension, diabetes mellitus, interstitial lung disease, gout, rheumatoid arthritis presents to the emergency room complaining of weakness and cough.  She also had nausea and vomiting.  Symptoms started yesterday.  In the emergency room her nausea and vomiting improved.  She was found to have left lower lobe pneumonia with leukocytosis.  Oxygen saturations dipped to 88% transiently.  Is being admitted under observation.  Afebrile.  PAST MEDICAL HISTORY:   Past Medical History:  Diagnosis Date  . Anxiety   . Breast cancer ([place]) 2015   right- radiation  . Cough   . Depression   . Diabetes mellitus without complication ([place])   . Gout   . HLD (hyperlipidemia)   . HTN (hypertension)   . Hypertension   . Interstitial lung disease ([place])   . Lumbar spinal stenosis   . Macular degeneration   . Migraines   . Osteoarthritis   . Personal history of radiation therapy   . Phlebitis   . Pulmonary nodule   . Rheumatoid arthritis ([place])     PAST SURGICAL HISTORY:   Past Surgical History:  Procedure Laterality Date  . ABDOMINAL HYSTERECTOMY    . ANTERIOR CERVICAL DECOMP/DISCECTOMY FUSION N/A 04/04/2016   Procedure: ANTERIOR CERVICAL DECOMPRESSION/DISCECTOMY FUSION, INTERBODY PROSTHESIS,PLATE CERVICAL  FIVE-SIX,CERVICAL SIX-SEVEN,CERVICAL SEVEN-THORACIC ONE;  Surgeon: Newman Pies, MD;  Location: [facility];  Service: Neurosurgery;  Laterality: N/A;  . BREAST BIOPSY Right 02/21/2015   CALCIFICATION INVOLVING HYALINIZED STROMA AND BENIGN DUCTS  . BREAST BIOPSY Right  03/29/2016   FAT NECROSIS WITH CALCIFICATIONS.   Marland Kitchen BREAST BIOPSY Right 2015   + invasive mam ca  . BREAST BIOPSY Right 11/30/2016   BENIGN BREAST TISSUE WITH ORGANIZING FAT NECROSIS AND CHANGES   . COLONOSCOPY    . ESOPHAGOGASTRODUODENOSCOPY    . MASTECTOMY, PARTIAL Right     SOCIAL HISTORY:   Social History   Tobacco Use  . Smoking status: Never Smoker  . Smokeless tobacco: Never Used  Substance Use Topics  . Alcohol use: No    Alcohol/week: 0.0 standard drinks    FAMILY HISTORY:   Family History  Problem Relation Age of Onset  . Colon cancer Mother   . Stomach cancer Mother   . Esophageal cancer Mother   . COPD Father   . Colon polyps Sister   . Ovarian cancer Sister   . Kidney disease Neg Hx   . Bladder Cancer Neg Hx   . Kidney cancer Neg Hx   . Breast cancer Neg Hx     DRUG ALLERGIES:   Allergies  Allergen Reactions  . Lisinopril Cough  . Oxycodone-Acetaminophen Other (See Comments)    Other reaction(s): Hallucination Caused AMS & hallucinations  . Penicillins Other (See Comments)    Makes pt very "forgetful"  . Clarithromycin Rash  . Sulfa Antibiotics Rash    REVIEW OF SYSTEMS:   Review of Systems  Constitutional: Positive for malaise/fatigue. Negative for chills and fever.  HENT: Negative for sore throat.   Eyes: Negative for blurred vision, double vision and pain.  Respiratory: Positive for cough. Negative for hemoptysis, shortness of breath and wheezing.   Cardiovascular: Negative for chest pain, palpitations, orthopnea and leg swelling.  Gastrointestinal: Positive for nausea and vomiting. Negative for abdominal pain, constipation, diarrhea and heartburn.  Genitourinary: Negative for dysuria and hematuria.  Musculoskeletal: Negative for back pain and joint pain.  Skin: Negative for rash.  Neurological: Negative for sensory change, speech change, focal weakness and headaches.  Endo/Heme/Allergies: Does not bruise/bleed easily.   Psychiatric/Behavioral: Negative for depression. The patient is not nervous/anxious.     MEDICATIONS AT HOME:   Prior to Admission medications   Medication Sig Start Date End Date Taking? Authorizing Provider  Adalimumab (HUMIRA) 40 MG/0.8ML PSKT Inject 40 mg into the skin every 21 ( twenty-one) days.   Yes [provider]  allopurinol (ZYLOPRIM) 300 MG tablet Take 300 mg by mouth daily.   Yes [provider]  amLODipine (NORVASC) 5 MG tablet Take by mouth. 08/12/17 08/12/18 Yes [provider]  Ascorbic Acid (VITAMIN C) 1000 MG tablet Take 1,000 mg by mouth 2 (two) times daily.   Yes [provider]  aspirin EC 325 MG tablet Take 325 mg by mouth daily.    Yes [provider]  busPIRone (BUSPAR) 10 MG tablet Take 10 mg by mouth 2 (two) times daily.  09/03/16  Yes [provider]  carvedilol (COREG) 6.25 MG tablet Take 6.25 mg by mouth 2 (two) times daily with a meal.   Yes [provider]  Cranberry 400 MG CAPS Take 1 capsule by mouth 2 (two) times daily.   Yes [provider]  folic acid (FOLVITE) 967 MCG tablet Take 400 mcg by mouth daily.   Yes [provider]  losartan (COZAAR) 100 MG tablet Take 100 mg by mouth daily.   Yes [provider]  lovastatin (MEVACOR) 40 MG tablet Take 40 mg by mouth at bedtime.   Yes [provider]  metFORMIN (GLUCOPHAGE) 500 MG tablet Take 1,000 mg by mouth 2 (two) times daily with a meal.    Yes [provider]  methotrexate 50 MG/2ML injection Inject 15 mg as directed once a week. 04/07/18  Yes [provider]  Multiple Vitamin (MULTIVITAMIN WITH MINERALS) TABS tablet Take 1 tablet by mouth daily.   Yes [provider]  Multiple Vitamins-Minerals (PRESERVISION/LUTEIN) CAPS Take 1 capsule by mouth daily.   Yes [provider]  PARoxetine (PAXIL) 30 MG tablet Take 30 mg by mouth daily.    Yes [provider]   nitrofurantoin, macrocrystal-monohydrate, (MACROBID) 100 MG capsule  12/11/17   [provider]     VITAL SIGNS:  Blood pressure 135/63, pulse 79, temperature 99.7 F (37.6 C), temperature source Oral, resp. rate (!) 22, height 5\' 4"  (1.626 m), weight 63.5 kg, SpO2 97 %.  PHYSICAL EXAMINATION:  Physical Exam  GENERAL:  85 y.o.-year-old patient lying in the bed with no acute distress.  EYES: Pupils equal, round, reactive to light and accommodation. No scleral icterus. Extraocular muscles intact.  HEENT: Head atraumatic, normocephalic. Oropharynx and nasopharynx clear. No oropharyngeal erythema, moist oral mucosa  NECK:  Supple, no jugular venous distention. No thyroid enlargement, no tenderness.  LUNGS: Mild bilateral wheezing.  Good air entry. CARDIOVASCULAR: S1, S2 normal. No murmurs, rubs, or gallops.  ABDOMEN: Soft, nontender, nondistended. Bowel sounds present. No organomegaly or mass.  EXTREMITIES: No pedal edema, cyanosis, or clubbing. + 2 pedal & radial pulses b/l.   NEUROLOGIC: Cranial nerves II through XII  are intact. No focal Motor or sensory deficits appreciated b/l PSYCHIATRIC: The patient is alert and oriented x 3. Good affect.  SKIN: No obvious rash, lesion, or ulcer.   LABORATORY PANEL:   CBC Recent Labs  Lab 08/04/18 1156  WBC 15.9*  HGB 11.6*  HCT 34.8*  PLT 167   ------------------------------------------------------------------------------------------------------------------  Chemistries  Recent Labs  Lab 08/04/18 1156  NA 131*  K 3.9  CL 99  CO2 22  GLUCOSE 171*  BUN 13  CREATININE 0.61  CALCIUM 8.8*  AST 24  ALT 15  ALKPHOS 44  BILITOT 0.9   ------------------------------------------------------------------------------------------------------------------  Cardiac Enzymes No results for input(s): TROPONINI in the last 168  hours. ------------------------------------------------------------------------------------------------------------------  RADIOLOGY:  Dg Abdomen Acute W/chest  Result Date: 08/04/2018 CLINICAL DATA:  Nausea and vomiting.  Weakness. EXAM: DG ABDOMEN ACUTE W/ 1V CHEST COMPARISON:  Chest radiograph January 07, 2016; abdomen radiographs August 05, 2015 FINDINGS: PA chest: There is patchy atelectasis in both mid and lower lung zones. There is mild consolidation in the left base with small left pleural effusion. Heart is mildly enlarged with pulmonary vascularity normal. No evident adenopathy. Supine and upright abdomen: There is moderate stool throughout the colon. There is no bowel dilatation or air-fluid level to suggest bowel obstruction. No free air. There is aortoiliac atherosclerosis as well as splenic artery calcification. There are phleboliths in the pelvis. Bones are diffusely osteoporotic. IMPRESSION: No bowel obstruction or free air evident. There is extensive aortoiliac atherosclerotic change. Bones osteoporotic. Focal infiltrate felt to represent pneumonia left base with small left pleural effusion. Areas of patchy atelectasis in both mid and lower lung zones. Mild cardiac enlargement. Electronically Signed   By: Lowella Grip III M.D.   On: 08/04/2018 12:28     IMPRESSION AND PLAN:   *Left lower lobe pneumonia with leukocytosis.  Patient had saturations in the 80s but presently saturating 93% on room air.  Believe her off oxygen.  IV Levaquin.  Nebulizers.  With her wheezing will start steroids.  *Generalized weakness due to pneumonia.  Should improve.  *Nausea/vomiting have resolved.  No abdominal tenderness.  Has chronic diarrhea which is unchanged.  *Hypertension.  Continue home medications  *Diabetes mellitus.  Hold metformin from home.  Sliding scale insulin added.  Would likely be uncontrolled due to steroid use.  Check hemoglobin A1c.  DVT prophylaxis with Lovenox  All the  records are reviewed and case discussed with ED provider. Management plans discussed with the patient, family and they are in agreement.  CODE STATUS: DO NOT RESUSCITATE  TOTAL TIME TAKING CARE OF THIS PATIENT: 40 minutes.   Leia Alf Joelle [place] M.D on 08/04/2018 at 5:45 PM  Between 7am to 6pm - Pager - [phone]  After 6pm go to [email or web address] - password [email or web address] [facility]  Office  [phone]  CC: Primary care physician; Dion Body, MD  Note: This dictation was prepared with Dragon dictation along with smaller phrase technology. Any transcriptional errors that result from this process are unintentional.

## 2018-08-04 NOTE — ED Triage Notes (Signed)
Pt presents to ED via [facility] from home c/o "I just don't feel well" x 2-3 days. Pt denies pain, denies cough, denies urinary symptoms. States she feels weak all over.

## 2018-08-05 LAB — BASIC METABOLIC PANEL
Anion gap: 5 (ref 5–15)
BUN: 15 mg/dL (ref 8–23)
CO2: 27 mmol/L (ref 22–32)
Calcium: 8.8 mg/dL — ABNORMAL LOW (ref 8.9–10.3)
Chloride: 102 mmol/L (ref 98–111)
Creatinine, Ser: 0.64 mg/dL (ref 0.44–1.00)
GFR calc Af Amer: >60 mL/min (ref >60)
GFR calc non Af Amer: >60 mL/min (ref >60)
Glucose, Bld: 253 mg/dL — ABNORMAL HIGH (ref 70–99)
Potassium: 3.6 mmol/L (ref 3.5–5.1)
Sodium: 134 mmol/L — ABNORMAL LOW (ref 135–145)

## 2018-08-05 LAB — CBC
HCT: 33.9 % — ABNORMAL LOW (ref 36.0–46.0)
Hemoglobin: 11.3 g/dL — ABNORMAL LOW (ref 12.0–15.0)
MCH: 29.8 pg (ref 26.0–34.0)
MCHC: 33.3 g/dL (ref 30.0–36.0)
MCV: 89.4 fL (ref 80.0–100.0)
NRBC: 0 % (ref 0.0–0.2)
Platelets: 149 10*3/uL — ABNORMAL LOW (ref 150–400)
RBC: 3.79 MIL/uL — ABNORMAL LOW (ref 3.87–5.11)
RDW: 14.8 % (ref 11.5–15.5)
WBC: 12.4 10*3/uL — ABNORMAL HIGH (ref 4.0–10.5)

## 2018-08-05 LAB — GLUCOSE, CAPILLARY
Glucose-Capillary: 185 mg/dL — ABNORMAL HIGH (ref 70–99)
Glucose-Capillary: 192 mg/dL — ABNORMAL HIGH (ref 70–99)

## 2018-08-05 MED ORDER — CEFDINIR 300 MG PO CAPS: 300.0000 mg | ORAL_CAPSULE | Freq: Two times a day (BID) | ORAL | 0 refills | Status: DC

## 2018-08-05 MED ORDER — LOSARTAN POTASSIUM 50 MG PO TABS
100.0000 mg | ORAL_TABLET | Freq: Every day | ORAL | Status: DC
  Administered 2018-08-05: 100 mg via ORAL
  Filled 2018-08-05: qty 2

## 2018-08-05 MED ORDER — CARVEDILOL 3.125 MG PO TABS
6.2500 mg | ORAL_TABLET | Freq: Two times a day (BID) | ORAL | Status: DC
  Filled 2018-08-05: qty 2

## 2018-08-05 MED ORDER — FOLIC ACID 400 MCG PO TABS: 400.0000 ug | ORAL_TABLET | Freq: Every day | ORAL | Status: DC

## 2018-08-05 MED ORDER — BUSPIRONE HCL 10 MG PO TABS
10.0000 mg | ORAL_TABLET | Freq: Two times a day (BID) | ORAL | Status: DC
  Administered 2018-08-05: 09:00:00 10 mg via ORAL
  Filled 2018-08-05 (×2): qty 1

## 2018-08-05 MED ORDER — AZITHROMYCIN 250 MG PO TABS: 250.0000 mg | ORAL_TABLET | Freq: Every day | ORAL | 0 refills | Status: DC

## 2018-08-05 MED ORDER — FOLIC ACID 1 MG PO TABS
0.5000 mg | ORAL_TABLET | Freq: Every day | ORAL | Status: DC
  Administered 2018-08-05: 0.5 mg via ORAL
  Filled 2018-08-05: qty 1

## 2018-08-05 MED ORDER — AMLODIPINE BESYLATE 5 MG PO TABS
5.0000 mg | ORAL_TABLET | Freq: Every day | ORAL | Status: DC
  Filled 2018-08-05: qty 1

## 2018-08-05 MED ORDER — PAROXETINE HCL 30 MG PO TABS
30.0000 mg | ORAL_TABLET | Freq: Every day | ORAL | Status: DC
  Administered 2018-08-05: 30 mg via ORAL
  Filled 2018-08-05: qty 1

## 2018-08-05 MED ORDER — ALLOPURINOL 300 MG PO TABS
300.0000 mg | ORAL_TABLET | Freq: Every day | ORAL | Status: DC
  Administered 2018-08-05: 09:00:00 300 mg via ORAL
  Filled 2018-08-05: qty 1

## 2018-08-05 MED ORDER — ASPIRIN EC 325 MG PO TBEC
325.0000 mg | DELAYED_RELEASE_TABLET | Freq: Every day | ORAL | Status: DC
  Administered 2018-08-05: 09:00:00 325 mg via ORAL
  Filled 2018-08-05: qty 1

## 2018-08-05 MED ORDER — PREDNISONE 50 MG PO TABS: 50.0000 mg | ORAL_TABLET | Freq: Every day | ORAL | 0 refills | Status: DC

## 2018-08-05 MED ORDER — PRAVASTATIN SODIUM 20 MG PO TABS: 40.0000 mg | ORAL_TABLET | Freq: Every day | ORAL | Status: DC

## 2018-08-05 NOTE — Care Management Obs Status (Signed)
[facility] NOTIFICATION   Patient Details  Name: KAAREN NASS MRN: [id number] Date of Birth: [birth year]   Medicare Observation Status Notification Given:  Yes    Katrina Stack, RN 08/05/2018, 10:04 AM

## 2018-08-05 NOTE — Discharge Summary (Signed)
[facility] [place] NAME: [name]    MR#:  [id number]  DATE OF BIRTH:  Jan 25, 1932  DATE OF ADMISSION:  08/04/2018   ADMITTING PHYSICIAN: Hillary Bow, MD  DATE OF DISCHARGE: 08/05/18  PRIMARY CARE PHYSICIAN: Dion Body, MD   ADMISSION DIAGNOSIS:  Acute respiratory failure with hypoxia ([place]) [J96.01] Pneumonia of left lower lobe due to infectious organism ([place]) [J18.1] DISCHARGE DIAGNOSIS:  Active Problems:   Pneumonia  SECONDARY DIAGNOSIS:   Past Medical History:  Diagnosis Date  . Anxiety   . Breast cancer ([place]) 2015   right- radiation  . Cough   . Depression   . Diabetes mellitus without complication ([place])   . Gout   . HLD (hyperlipidemia)   . HTN (hypertension)   . Hypertension   . Interstitial lung disease ([place])   . Lumbar spinal stenosis   . Macular degeneration   . Migraines   . Osteoarthritis   . Personal history of radiation therapy   . Phlebitis   . Pulmonary nodule   . Rheumatoid arthritis [facility])    HOSPITAL COURSE:   [name] is an 85 year old female who presented to the ED with weakness and cough.  In the ED, she was found to have a left lower lobe pneumonia.  She had some oxygen desaturations to 88% on room air.  She was admitted for further management.  Left lower lobe CAP- improved. -[name] initially on 2 L of oxygen, but able to be weaned to room air on the day of discharge -Treated with IV ceftriaxone and azithromycin, and transitioned to [facility] and azithromycin p.o. for a total 5-day course on discharge -Treated with a 5-day course of prednisone  Generalized weakness due to pneumonia- resolved  Nausea/vomiting- resolved  Hypertension- stable -Continued home BP meds  Type 2 diabetes- stable -Metformin continued on discharge  DISCHARGE CONDITIONS:  CAP Hypertension Type 2 diabetes CONSULTS OBTAINED:  None DRUG ALLERGIES:   Allergies  Allergen Reactions  . Lisinopril  Cough  . Oxycodone-Acetaminophen Other (See Comments)    Other reaction(s): Hallucination Caused AMS & hallucinations  . Penicillins Other (See Comments)    Makes pt very "forgetful"  . Clarithromycin Rash  . Sulfa Antibiotics Rash   DISCHARGE MEDICATIONS:   Allergies as of 08/05/2018      Reactions   Lisinopril Cough   Oxycodone-acetaminophen Other (See Comments)   Other reaction(s): Hallucination Caused AMS & hallucinations   Penicillins Other (See Comments)   Makes pt very "forgetful"   Clarithromycin Rash   Sulfa Antibiotics Rash      Medication List    STOP taking these medications   nitrofurantoin (macrocrystal-monohydrate) 100 MG capsule Commonly known as:  MACROBID     TAKE these medications   allopurinol 300 MG tablet Commonly known as:  ZYLOPRIM Take 300 mg by mouth daily.   amLODipine 5 MG tablet Commonly known as:  NORVASC Take by mouth.   aspirin EC 325 MG tablet Take 325 mg by mouth daily.   azithromycin 250 MG tablet Commonly known as:  ZITHROMAX Take 1 tablet (250 mg total) by mouth daily.   busPIRone 10 MG tablet Commonly known as:  BUSPAR Take 10 mg by mouth 2 (two) times daily.   carvedilol 6.25 MG tablet Commonly known as:  COREG Take 6.25 mg by mouth 2 (two) times daily with a meal.   cefdinir 300 MG capsule Commonly known as:  OMNICEF Take 1 capsule (300 mg total) by  mouth 2 (two) times daily.   Cranberry 400 MG Caps Take 1 capsule by mouth 2 (two) times daily.   folic acid 875 MCG tablet Commonly known as:  FOLVITE Take 400 mcg by mouth daily.   HUMIRA 40 MG/0.8ML Pskt Generic drug:  Adalimumab Inject 40 mg into the skin every 21 ( twenty-one) days.   losartan 100 MG tablet Commonly known as:  COZAAR Take 100 mg by mouth daily.   lovastatin 40 MG tablet Commonly known as:  MEVACOR Take 40 mg by mouth at bedtime.   metFORMIN 500 MG tablet Commonly known as:  GLUCOPHAGE Take 1,000 mg by mouth 2 (two) times daily with a  meal.   methotrexate 50 MG/2ML injection Inject 15 mg as directed once a week.   multivitamin with minerals Tabs tablet Take 1 tablet by mouth daily.   PARoxetine 30 MG tablet Commonly known as:  PAXIL Take 30 mg by mouth daily.   predniSONE 50 MG tablet Commonly known as:  DELTASONE Take 1 tablet (50 mg total) by mouth daily with breakfast.   PRESERVISION/LUTEIN Caps Take 1 capsule by mouth daily.   vitamin C 1000 MG tablet Take 1,000 mg by mouth 2 (two) times daily.        DISCHARGE INSTRUCTIONS:  1.  Follow-up with PCP in 5 days 2.  Take Omnicef, azithromycin, and prednisone for a total 5-day course DIET:  Cardiac diet and Diabetic diet DISCHARGE CONDITION:  Stable ACTIVITY:  Activity as tolerated OXYGEN:  Home Oxygen: No.  Oxygen Delivery: room air DISCHARGE LOCATION:  home   If you experience worsening of your admission symptoms, develop shortness of breath, life threatening emergency, suicidal or homicidal thoughts you must seek medical attention immediately by calling 911 or calling your MD immediately  if symptoms less severe.  You Must read complete instructions/literature along with all the possible adverse reactions/side effects for all the Medicines you take and that have been prescribed to you. Take any new Medicines after you have completely understood and accpet all the possible adverse reactions/side effects.   Please note  You were cared for by a hospitalist during your hospital stay. If you have any questions about your discharge medications or the care you received while you were in the hospital after you are discharged, you can call the unit and asked to speak with the hospitalist on call if the hospitalist that took care of you is not available. Once you are discharged, your primary care physician will handle any further medical issues. Please note that NO REFILLS for any discharge medications will be authorized once you are discharged, as it is  imperative that you return to your primary care physician (or establish a relationship with a primary care physician if you do not have one) for your aftercare needs so that they can reassess your need for medications and monitor your lab values.    On the day of Discharge:  VITAL SIGNS:  Blood pressure (!) 111/52, pulse 62, temperature (!) 97.4 F (36.3 C), temperature source Axillary, resp. rate 18, height 5\' 4"  (1.626 m), weight 56.2 kg, SpO2 99 %. PHYSICAL EXAMINATION:  GENERAL:  [name] lying in the bed with no acute distress.  EYES: Pupils equal, round, reactive to light and accommodation. No scleral icterus. Extraocular muscles intact.  HEENT: Head atraumatic, normocephalic. Oropharynx and nasopharynx clear.  NECK:  Supple, no jugular venous distention. No thyroid enlargement, no tenderness.  LUNGS: + Left lower lobe crackles present.  Normal  work of breathing on room air.  No use of accessory muscles of respiration.  CARDIOVASCULAR: S1, S2 normal. No murmurs, rubs, or gallops.  ABDOMEN: Soft, non-tender, non-distended. Bowel sounds present. No organomegaly or mass.  EXTREMITIES: No pedal edema, cyanosis, or clubbing.  NEUROLOGIC: Cranial nerves II through XII are intact. Muscle strength 5/5 in all extremities. Sensation intact. Gait not checked.  PSYCHIATRIC: The [name] is alert and oriented x 3.  SKIN: No obvious rash, lesion, or ulcer.  DATA REVIEW:   CBC Recent Labs  Lab 08/05/18 0421  WBC 12.4*  HGB 11.3*  HCT 33.9*  PLT 149*    Chemistries  Recent Labs  Lab 08/04/18 1156 08/05/18 0421  NA 131* 134*  K 3.9 3.6  CL 99 102  CO2 22 27  GLUCOSE 171* 253*  BUN 13 15  CREATININE 0.61 0.64  CALCIUM 8.8* 8.8*  AST 24  --   ALT 15  --   ALKPHOS 44  --   BILITOT 0.9  --      Microbiology Results  Results for orders placed or performed during the hospital encounter of 11/09/16  Urine culture     Status: None   Collection Time: 11/09/16 10:00  AM  Result Value Ref Range Status   Specimen Description URINE, CLEAN CATCH  Final   Special Requests NONE  Final   Culture   Final    NO GROWTH Performed at [facility], [place] [address] [place], [place] [address]    Report Status 11/10/2016 FINAL  Final    RADIOLOGY:  No results found.   Management plans discussed with the [name], family and they are in agreement.  CODE STATUS: DNR   TOTAL TIME TAKING CARE OF THIS [name]: 35 minutes.    Berna Spare Daimian Sudberry M.D on 08/05/2018 at 1:14 PM  Between 7am to 6pm - Pager - [phone]  After 6pm go to [email or web address] - Proofreader  [facility]  Office  [phone]  CC: Primary care physician; Dion Body, MD   Note: This dictation was prepared with Dragon dictation along with smaller phrase technology. Any transcriptional errors that result from this process are unintentional.

## 2018-08-05 NOTE — Care Management Note (Signed)
Case Management Note  Patient Details  Name: [name] MRN: [id number] Date of Birth: Jul 27, 1931  Subjective/Objective:                 Placed in observation from home for weakness and not feeling well. Pneumonia found on xray. White blood cells elevated.  Patient oxygen levels on room air on presentation was 88% but at the time of being placed in observation, here levels were 93% on room air. has access to a walker, no issues accessing medical care or paying for meds. Daughter Amy provides support  Action/Plan:  Discussed ob servation status with attending  Expected Discharge Date:                  Expected Discharge Plan:     In-House Referral:     Discharge planning Services     Post Acute Care Choice:    Choice offered to:     DME Arranged:    DME Agency:     HH Arranged:    HH Agency:     Status of Service:     If discussed at [employer], dates discussed:    Additional Comments:  Katrina Stack, RN 08/05/2018, 9:59 AM

## 2018-08-05 NOTE — Discharge Instructions (Signed)
It was so nice to meet you during this hospitalization!  You were treated for a pneumonia in your left lower lung. We gave you antibiotics through your IV. I have prescribed you some antibiotics and steroids to take at home. 1. Please take Azithromycin 250mg  daily for 4 more days 2. Take Omnicef 300mg  twice a day for 4 more days 3. Take Prednisone 50mg  daily for 4 more days  Please make sure you follow-up with your primary care doctor in the next 5 days.  Take care, Dr. Brett Albino

## 2018-08-09 LAB — CULTURE, BLOOD (ROUTINE X 2)
CULTURE: NO GROWTH
Culture: NO GROWTH
Special Requests: ADEQUATE

## 2018-10-27 ENCOUNTER — Encounter: Payer: Self-pay | Admitting: *Deleted

## 2018-11-12 ENCOUNTER — Ambulatory Visit: Payer: Medicare Other | Admitting: Radiation Oncology

## 2018-12-27 ENCOUNTER — Other Ambulatory Visit: Payer: Self-pay

## 2018-12-27 ENCOUNTER — Emergency Department: Payer: Medicare Other

## 2018-12-27 ENCOUNTER — Encounter: Payer: Self-pay | Admitting: Emergency Medicine

## 2018-12-27 DIAGNOSIS — Y999 Unspecified external cause status: Secondary | ICD-10-CM | POA: Diagnosis not present

## 2018-12-27 DIAGNOSIS — E119 Type 2 diabetes mellitus without complications: Secondary | ICD-10-CM | POA: Insufficient documentation

## 2018-12-27 DIAGNOSIS — Z7984 Long term (current) use of oral hypoglycemic drugs: Secondary | ICD-10-CM | POA: Insufficient documentation

## 2018-12-27 DIAGNOSIS — Z79899 Other long term (current) drug therapy: Secondary | ICD-10-CM | POA: Diagnosis not present

## 2018-12-27 DIAGNOSIS — I10 Essential (primary) hypertension: Secondary | ICD-10-CM | POA: Diagnosis not present

## 2018-12-27 DIAGNOSIS — S0990XA Unspecified injury of head, initial encounter: Secondary | ICD-10-CM | POA: Insufficient documentation

## 2018-12-27 DIAGNOSIS — Z7982 Long term (current) use of aspirin: Secondary | ICD-10-CM | POA: Diagnosis not present

## 2018-12-27 DIAGNOSIS — Y939 Activity, unspecified: Secondary | ICD-10-CM | POA: Diagnosis not present

## 2018-12-27 DIAGNOSIS — Y929 Unspecified place or not applicable: Secondary | ICD-10-CM | POA: Insufficient documentation

## 2018-12-27 DIAGNOSIS — W010XXA Fall on same level from slipping, tripping and stumbling without subsequent striking against object, initial encounter: Secondary | ICD-10-CM | POA: Insufficient documentation

## 2018-12-27 DIAGNOSIS — S299XXA Unspecified injury of thorax, initial encounter: Secondary | ICD-10-CM | POA: Diagnosis present

## 2018-12-27 DIAGNOSIS — S22080A Wedge compression fracture of T11-T12 vertebra, initial encounter for closed fracture: Secondary | ICD-10-CM | POA: Insufficient documentation

## 2018-12-27 DIAGNOSIS — S39012A Strain of muscle, fascia and tendon of lower back, initial encounter: Secondary | ICD-10-CM | POA: Insufficient documentation

## 2018-12-27 NOTE — ED Triage Notes (Signed)
Pt to ED from home with granddaughter as support person c/o lower back pain for a couple days.  States fell a couple days ago d/t tripping on rug at home, denies LOC but did hit her head, takes daily ASA.  Chest rise even and unlabored, skin WNL, in NAD at this time.

## 2018-12-28 ENCOUNTER — Emergency Department: Payer: Medicare Other

## 2018-12-28 ENCOUNTER — Emergency Department
Admission: EM | Admit: 2018-12-28 | Discharge: 2018-12-28 | Disposition: A | Discharge status: Home / Self Care | Payer: Medicare Other | Attending: Emergency Medicine | Admitting: Emergency Medicine

## 2018-12-28 DIAGNOSIS — S22080A Wedge compression fracture of T11-T12 vertebra, initial encounter for closed fracture: Secondary | ICD-10-CM

## 2018-12-28 DIAGNOSIS — S39012A Strain of muscle, fascia and tendon of lower back, initial encounter: Secondary | ICD-10-CM

## 2018-12-28 MED ORDER — TRAMADOL HCL 50 MG PO TABS
50.0000 mg | ORAL_TABLET | Freq: Once | ORAL | Status: AC
  Administered 2018-12-28: 50 mg via ORAL
  Filled 2018-12-28: qty 1

## 2018-12-28 MED ORDER — TRAMADOL HCL 50 MG PO TABS: 50.0000 mg | ORAL_TABLET | Freq: Four times a day (QID) | ORAL | 0 refills | Status: DC | PRN

## 2018-12-28 NOTE — Discharge Instructions (Addendum)
Your xrays show multiple compression fractures of the lower back in the area of your pain.  We have requested that Case Management set up home health services and physical therpy. Please follow up with your primary care doctor on Monday as well.    For pain, take tylenol 650mg  four times a day, and tramadol as needed for additional pain.   Results for orders placed or performed during the hospital encounter of 08/04/18  Blood Culture (routine x 2)   Specimen: BLOOD  Result Value Ref Range   Specimen Description BLOOD BLOOD LEFT WRIST    Special Requests      BOTTLES DRAWN AEROBIC AND ANAEROBIC Blood Culture results may not be optimal due to an excessive volume of blood received in culture bottles   Culture      NO GROWTH 5 DAYS Performed at [facility], [address] [place], [place] [address]    Report Status 08/09/2018 FINAL   Blood Culture (routine x 2)   Specimen: BLOOD  Result Value Ref Range   Specimen Description BLOOD BLOOD LEFT HAND    Special Requests      BOTTLES DRAWN AEROBIC AND ANAEROBIC Blood Culture adequate volume   Culture      NO GROWTH 5 DAYS Performed at [facility], [address] [place], [place] [address]    Report Status 08/09/2018 FINAL   CBC with Differential/Platelet  Result Value Ref Range   WBC 15.9 (H) 4.0 - 10.5 K/uL   RBC 3.87 3.87 - 5.11 MIL/uL   Hemoglobin 11.6 (L) 12.0 - 15.0 g/dL   HCT 34.8 (L) 36.0 - 46.0 %   MCV 89.9 80.0 - 100.0 fL   MCH 30.0 26.0 - 34.0 pg   MCHC 33.3 30.0 - 36.0 g/dL   RDW 15.0 11.5 - 15.5 %   Platelets 167 150 - 400 K/uL   nRBC 0.0 0.0 - 0.2 %   Neutrophils Relative % 80 %   Neutro Abs 12.7 (H) 1.7 - 7.7 K/uL   Lymphocytes Relative 10 %   Lymphs Abs 1.6 0.7 - 4.0 K/uL   Monocytes Relative 9 %   Monocytes Absolute 1.4 (H) 0.1 - 1.0 K/uL   Eosinophils Relative 0 %   Eosinophils Absolute 0.0 0.0 - 0.5 K/uL   Basophils Relative 0 %   Basophils Absolute 0.0 0.0 - 0.1 K/uL   Immature  Granulocytes 1 %   Abs Immature Granulocytes 0.09 (H) 0.00 - 0.07 K/uL  Comprehensive metabolic panel  Result Value Ref Range   Sodium 131 (L) 135 - 145 mmol/L   Potassium 3.9 3.5 - 5.1 mmol/L   Chloride 99 98 - 111 mmol/L   CO2 22 22 - 32 mmol/L   Glucose, Bld 171 (H) 70 - 99 mg/dL   BUN 13 8 - 23 mg/dL   Creatinine, Ser 0.61 0.44 - 1.00 mg/dL   Calcium 8.8 (L) 8.9 - 10.3 mg/dL   Total Protein 7.2 6.5 - 8.1 g/dL   Albumin 3.9 3.5 - 5.0 g/dL   AST 24 15 - 41 U/L   ALT 15 0 - 44 U/L   Alkaline Phosphatase 44 38 - 126 U/L   Total Bilirubin 0.9 0.3 - 1.2 mg/dL   GFR calc non Af Amer >60 >60 mL/min   GFR calc Af Amer >60 >60 mL/min   Anion gap 10 5 - 15  Lipase, blood  Result Value Ref Range   Lipase 46 11 - 51 U/L  Influenza  panel by PCR (type A & B)  Result Value Ref Range   Influenza A By PCR NEGATIVE NEGATIVE   Influenza B By PCR NEGATIVE NEGATIVE  Basic metabolic panel  Result Value Ref Range   Sodium 134 (L) 135 - 145 mmol/L   Potassium 3.6 3.5 - 5.1 mmol/L   Chloride 102 98 - 111 mmol/L   CO2 27 22 - 32 mmol/L   Glucose, Bld 253 (H) 70 - 99 mg/dL   BUN 15 8 - 23 mg/dL   Creatinine, Ser 0.64 0.44 - 1.00 mg/dL   Calcium 8.8 (L) 8.9 - 10.3 mg/dL   GFR calc non Af Amer >60 >60 mL/min   GFR calc Af Amer >60 >60 mL/min   Anion gap 5 5 - 15  CBC  Result Value Ref Range   WBC 12.4 (H) 4.0 - 10.5 K/uL   RBC 3.79 (L) 3.87 - 5.11 MIL/uL   Hemoglobin 11.3 (L) 12.0 - 15.0 g/dL   HCT 33.9 (L) 36.0 - 46.0 %   MCV 89.4 80.0 - 100.0 fL   MCH 29.8 26.0 - 34.0 pg   MCHC 33.3 30.0 - 36.0 g/dL   RDW 14.8 11.5 - 15.5 %   Platelets 149 (L) 150 - 400 K/uL   nRBC 0.0 0.0 - 0.2 %  Hemoglobin A1c  Result Value Ref Range   Hgb A1c MFr Bld 7.1 (H) 4.8 - 5.6 %   Mean Plasma Glucose 157.07 mg/dL  Glucose, capillary  Result Value Ref Range   Glucose-Capillary 177 (H) 70 - 99 mg/dL  Glucose, capillary  Result Value Ref Range   Glucose-Capillary 185 (H) 70 - 99 mg/dL  Glucose,  capillary  Result Value Ref Range   Glucose-Capillary 192 (H) 70 - 99 mg/dL   Dg Thoracic Spine 2 View  Result Date: 12/27/2018 CLINICAL DATA:  Pain status post fall EXAM: THORACIC SPINE 2 VIEWS COMPARISON:  None. FINDINGS: There is age-indeterminate height loss of the T4 vertebral body. There is diffuse osteopenia which limits detection of nondisplaced fractures. Again identified are findings concerning for compression fractures of the T11 and T12 vertebral bodies. IMPRESSION: 1. Age-indeterminate compression deformity of the T4 vertebral body. 2. Again identified are findings concerning for acute compression fractures involving the T11 and T12 vertebral bodies. Electronically Signed   By: Constance Holster M.D.   On: 12/27/2018 21:59   Dg Lumbar Spine Complete  Result Date: 12/27/2018 CLINICAL DATA:  Low back pain times several days EXAM: LUMBAR SPINE - COMPLETE 4+ VIEW COMPARISON:  None. FINDINGS: Findings are suspicious acute fractures involving the T12 and L1 vertebral bodies. There is some superior endplate sclerosis of the L3 vertebral body. There is a slight irregularity of the superior endplate of the I77 vertebral body. There are multilevel degenerative changes throughout the lumbar spine with multilevel facet arthrosis. There is a grade 1 anterolisthesis of L4 on L5. Advanced aortic calcifications are noted. Phleboliths project over the patient's pelvis. IMPRESSION: 1. Findings suspicious for acute compression fractures involving the T12 and L1 vertebral bodies. Superior endplate irregularities of the T11 and L3 vertebral bodies may represent additional acute fractures. 2. Advanced multilevel degenerative changes throughout the lumbar spine. 3.  Aortic Atherosclerosis (ICD10-I70.0). Electronically Signed   By: Constance Holster M.D.   On: 12/27/2018 21:57   Ct Head Wo Contrast  Result Date: 12/27/2018 CLINICAL DATA:  85 year old female with head trauma. EXAM: CT HEAD WITHOUT CONTRAST  TECHNIQUE: Contiguous axial images were obtained from the base of  the skull through the vertex without intravenous contrast. COMPARISON:  None. FINDINGS: Brain: There is moderate age-related atrophy and chronic microvascular ischemic changes. There is no acute intracranial hemorrhage. No mass effect or midline shift. No extra-axial fluid collection. Vascular: No hyperdense vessel or unexpected calcification. Skull: Normal. Negative for fracture or focal lesion. Sinuses/Orbits: No acute finding. Other: None IMPRESSION: 1. No acute intracranial hemorrhage. 2. Age-related atrophy and chronic microvascular ischemic changes. Electronically Signed   By: Anner Crete M.D.   On: 12/27/2018 22:16   Dg Hip Unilat W Or Wo Pelvis 2-3 Views Left  Result Date: 12/28/2018 CLINICAL DATA:  Left hip pain status post fall EXAM: DG HIP (WITH OR WITHOUT PELVIS) 2-3V LEFT COMPARISON:  None. FINDINGS: There is diffuse osteopenia. There are degenerative changes of both hips. Phleboliths project over the patient's pelvis. IMPRESSION: Negative. Electronically Signed   By: Constance Holster M.D.   On: 12/28/2018 01:51

## 2018-12-28 NOTE — ED Notes (Signed)
This RN reviewed discharge instructions, follow-up care, prescriptions, and cryotherapy with patient. Patient verbalized understanding of all reviewed information.  Patient stable, with no distress noted at this time.

## 2018-12-28 NOTE — ED Provider Notes (Signed)
[facility] Emergency Department Provider Note  ____________________________________________  Time seen: Approximately 2:13 AM  I have reviewed the triage vital signs and the nursing notes.   HISTORY  Chief Complaint Back Pain    HPI [name] is a 85 y.o. female  With a history of hypertension diabetes rheumatoid arthritis who is brought to the ED due to low back pain that started 2 days ago after a trip and fall at home, constant, nonradiating, worse standing, no alleviating factors. She also hit her head at that time but has not been having any worsening headache vision changes paresthesias or motor weakness.  No change in balance or coordination other than the pain limitation.  She normally uses a cane and a walker at home .     Past Medical History:  Diagnosis Date  . Anxiety   . Breast cancer ([place]) 2015   right- radiation  . Cough   . Depression   . Diabetes mellitus without complication ([place])   . Gout   . HLD (hyperlipidemia)   . HTN (hypertension)   . Hypertension   . Interstitial lung disease ([place])   . Lumbar spinal stenosis   . Macular degeneration   . Migraines   . Osteoarthritis   . Personal history of radiation therapy   . Phlebitis   . Pulmonary nodule   . Rheumatoid arthritis [facility])      Patient Active Problem List   Diagnosis Date Noted  . Pneumonia 08/04/2018  . Malignant neoplasm of upper-outer quadrant of right breast in female, estrogen receptor negative ([place]) 04/29/2017  . Cervical spondylosis with radiculopathy 04/04/2016  . Recurrent UTI 10/06/2015  . Vaginal atrophy 10/06/2015  . Diabetes mellitus, type 2 ([place]) 08/02/2015  . ILD (interstitial lung disease) ([place]) 08/02/2015  . Headache, migraine 08/02/2015  . Inflammation of a vein 08/02/2015  . Anxiety and depression 10/15/2013  . Essential (primary) hypertension 10/15/2013  . Gout 10/15/2013  . Degeneration macular 10/15/2013  . Combined fat and  carbohydrate induced hyperlipemia 10/15/2013  . Arthritis, degenerative 10/15/2013  . Arthritis or polyarthritis, rheumatoid ([place]) 10/15/2013     Past Surgical History:  Procedure Laterality Date  . ABDOMINAL HYSTERECTOMY    . ANTERIOR CERVICAL DECOMP/DISCECTOMY FUSION N/A 04/04/2016   Procedure: ANTERIOR CERVICAL DECOMPRESSION/DISCECTOMY FUSION, INTERBODY PROSTHESIS,PLATE CERVICAL  FIVE-SIX,CERVICAL SIX-SEVEN,CERVICAL SEVEN-THORACIC ONE;  Surgeon: Newman Pies, MD;  Location: [facility];  Service: Neurosurgery;  Laterality: N/A;  . BREAST BIOPSY Right 02/21/2015   CALCIFICATION INVOLVING HYALINIZED STROMA AND BENIGN DUCTS  . BREAST BIOPSY Right 03/29/2016   FAT NECROSIS WITH CALCIFICATIONS.   Marland Kitchen BREAST BIOPSY Right 2015   + invasive mam ca  . BREAST BIOPSY Right 11/30/2016   BENIGN BREAST TISSUE WITH ORGANIZING FAT NECROSIS AND CHANGES   . COLONOSCOPY    . ESOPHAGOGASTRODUODENOSCOPY    . MASTECTOMY, PARTIAL Right      Prior to Admission medications   Medication Sig Start Date End Date Taking? Authorizing Provider  Adalimumab (HUMIRA) 40 MG/0.8ML PSKT Inject 40 mg into the skin every 21 ( twenty-one) days.    [provider]  allopurinol (ZYLOPRIM) 300 MG tablet Take 300 mg by mouth daily.    [provider]  amLODipine (NORVASC) 5 MG tablet Take by mouth. 08/12/17 08/12/18  [provider]  Ascorbic Acid (VITAMIN C) 1000 MG tablet Take 1,000 mg by mouth 2 (two) times daily.    [provider]  aspirin EC 325 MG tablet Take 325 mg by  mouth daily.     [provider]  azithromycin (ZITHROMAX) 250 MG tablet Take 1 tablet (250 mg total) by mouth daily. 08/05/18   Mayo, Pete Pelt, MD  busPIRone (BUSPAR) 10 MG tablet Take 10 mg by mouth 2 (two) times daily.  09/03/16   [provider]  carvedilol (COREG) 6.25 MG tablet Take 6.25 mg by mouth 2 (two) times daily with a meal.    [provider]  cefdinir (OMNICEF) 300 MG capsule Take 1  capsule (300 mg total) by mouth 2 (two) times daily. 08/05/18   Mayo, Pete Pelt, MD  Cranberry 400 MG CAPS Take 1 capsule by mouth 2 (two) times daily.    [provider]  folic acid (FOLVITE) 902 MCG tablet Take 400 mcg by mouth daily.    [provider]  losartan (COZAAR) 100 MG tablet Take 100 mg by mouth daily.    [provider]  lovastatin (MEVACOR) 40 MG tablet Take 40 mg by mouth at bedtime.    [provider]  metFORMIN (GLUCOPHAGE) 500 MG tablet Take 1,000 mg by mouth 2 (two) times daily with a meal.     [provider]  methotrexate 50 MG/2ML injection Inject 15 mg as directed once a week. 04/07/18   [provider]  Multiple Vitamin (MULTIVITAMIN WITH MINERALS) TABS tablet Take 1 tablet by mouth daily.    [provider]  Multiple Vitamins-Minerals (PRESERVISION/LUTEIN) CAPS Take 1 capsule by mouth daily.    [provider]  PARoxetine (PAXIL) 30 MG tablet Take 30 mg by mouth daily.     [provider]  predniSONE (DELTASONE) 50 MG tablet Take 1 tablet (50 mg total) by mouth daily with breakfast. 08/05/18   Mayo, Pete Pelt, MD  traMADol (ULTRAM) 50 MG tablet Take 1 tablet (50 mg total) by mouth every 6 (six) hours as needed. 12/28/18   Carrie Mew, MD     Allergies Lisinopril, Oxycodone-acetaminophen, Penicillins, Clarithromycin, and Sulfa antibiotics   Family History  Problem Relation Age of Onset  . Colon cancer Mother   . Stomach cancer Mother   . Esophageal cancer Mother   . COPD Father   . Colon polyps Sister   . Ovarian cancer Sister   . Kidney disease Neg Hx   . Bladder Cancer Neg Hx   . Kidney cancer Neg Hx   . Breast cancer Neg Hx     Social History Social History   Tobacco Use  . Smoking status: Never Smoker  . Smokeless tobacco: Never Used  Substance Use Topics  . Alcohol use: No    Alcohol/week: 0.0 standard drinks  . Drug use: No    Review of  Systems  Constitutional:   No fever or chills.  ENT:   No sore throat. No rhinorrhea. Cardiovascular:   No chest pain or syncope. Respiratory:   No dyspnea or cough. Gastrointestinal:   Negative for abdominal pain, vomiting and diarrhea.  Musculoskeletal:   Low back pain as above All other systems reviewed and are negative except as documented above in ROS and HPI.  ____________________________________________   PHYSICAL EXAM:  VITAL SIGNS: ED Triage Vitals  Enc Vitals Group     BP 12/27/18 2102 (!) 157/77     Pulse Rate 12/27/18 2102 73     Resp 12/27/18 2102 16     Temp 12/27/18 2102 98.3 F (36.8 C)     Temp Source 12/27/18 2102 Oral     SpO2 12/27/18 2102  95 %     Weight 12/27/18 2103 149 lb (67.6 kg)     Height 12/27/18 2103 5\' 2"  (1.575 m)     Head Circumference --      Peak Flow --      Pain Score 12/27/18 2103 5     Pain Loc --      Pain Edu? --      Excl. in [place]? --     Vital signs reviewed, nursing assessments reviewed.   Constitutional:   Alert and oriented. Non-toxic appearance. Eyes:   Conjunctivae are normal. EOMI. PERRL. ENT      Head:   Normocephalic and atraumatic.      Nose:   No congestion/rhinnorhea.      Hematological/Lymphatic/Immunilogical:   No cervical lymphadenopathy. Cardiovascular:   RRR. Symmetric bilateral radial and DP pulses.  No murmurs. Cap refill less than 2 seconds. Respiratory:   Normal respiratory effort without tachypnea/retractions. Breath sounds are clear and equal bilaterally. No wheezes/rales/rhonchi. Gastrointestinal:   Soft and nontender. Non distended. There is no CVA tenderness.  No rebound, rigidity, or guarding. Musculoskeletal:   Normal range of motion in all extremities. No joint effusions.  No lower extremity tenderness.  No edema.  There is midline spinal tenderness over T10-L1 Neurologic:   Normal speech and language.  Motor grossly intact. No acute focal neurologic deficits are appreciated.  Skin:    Skin is  warm, dry and intact. No rash noted.  No petechiae, purpura, or bullae.  ____________________________________________    LABS (pertinent positives/negatives) (all labs ordered are listed, but only abnormal results are displayed) Labs Reviewed - No data to display ____________________________________________   EKG    ____________________________________________    RADIOLOGY  Dg Thoracic Spine 2 View  Result Date: 12/27/2018 CLINICAL DATA:  Pain status post fall EXAM: THORACIC SPINE 2 VIEWS COMPARISON:  None. FINDINGS: There is age-indeterminate height loss of the T4 vertebral body. There is diffuse osteopenia which limits detection of nondisplaced fractures. Again identified are findings concerning for compression fractures of the T11 and T12 vertebral bodies. IMPRESSION: 1. Age-indeterminate compression deformity of the T4 vertebral body. 2. Again identified are findings concerning for acute compression fractures involving the T11 and T12 vertebral bodies. Electronically Signed   By: Constance Holster M.D.   On: 12/27/2018 21:59   Dg Lumbar Spine Complete  Result Date: 12/27/2018 CLINICAL DATA:  Low back pain times several days EXAM: LUMBAR SPINE - COMPLETE 4+ VIEW COMPARISON:  None. FINDINGS: Findings are suspicious acute fractures involving the T12 and L1 vertebral bodies. There is some superior endplate sclerosis of the L3 vertebral body. There is a slight irregularity of the superior endplate of the Z61 vertebral body. There are multilevel degenerative changes throughout the lumbar spine with multilevel facet arthrosis. There is a grade 1 anterolisthesis of L4 on L5. Advanced aortic calcifications are noted. Phleboliths project over the patient's pelvis. IMPRESSION: 1. Findings suspicious for acute compression fractures involving the T12 and L1 vertebral bodies. Superior endplate irregularities of the T11 and L3 vertebral bodies may represent additional acute fractures. 2. Advanced  multilevel degenerative changes throughout the lumbar spine. 3.  Aortic Atherosclerosis (ICD10-I70.0). Electronically Signed   By: Constance Holster M.D.   On: 12/27/2018 21:57   Ct Head Wo Contrast  Result Date: 12/27/2018 CLINICAL DATA:  85 year old female with head trauma. EXAM: CT HEAD WITHOUT CONTRAST TECHNIQUE: Contiguous axial images were obtained from the base of the skull through the vertex without intravenous contrast. COMPARISON:  None. FINDINGS: Brain: There is moderate age-related atrophy and chronic microvascular ischemic changes. There is no acute intracranial hemorrhage. No mass effect or midline shift. No extra-axial fluid collection. Vascular: No hyperdense vessel or unexpected calcification. Skull: Normal. Negative for fracture or focal lesion. Sinuses/Orbits: No acute finding. Other: None IMPRESSION: 1. No acute intracranial hemorrhage. 2. Age-related atrophy and chronic microvascular ischemic changes. Electronically Signed   By: Anner Crete M.D.   On: 12/27/2018 22:16   Dg Hip Unilat W Or Wo Pelvis 2-3 Views Left  Result Date: 12/28/2018 CLINICAL DATA:  Left hip pain status post fall EXAM: DG HIP (WITH OR WITHOUT PELVIS) 2-3V LEFT COMPARISON:  None. FINDINGS: There is diffuse osteopenia. There are degenerative changes of both hips. Phleboliths project over the patient's pelvis. IMPRESSION: Negative. Electronically Signed   By: Constance Holster M.D.   On: 12/28/2018 01:51    ____________________________________________   PROCEDURES Procedures  ____________________________________________    CLINICAL IMPRESSION / ASSESSMENT AND PLAN / ED COURSE  Medications ordered in the ED: Medications  traMADol (ULTRAM) tablet 50 mg (has no administration in time range)    Pertinent labs & imaging results that were available during my care of the patient were reviewed by me and considered in my medical decision making (see chart for details).  [place] was evaluated  in Emergency Department on 12/28/2018 for the symptoms described in the history of present illness. She was evaluated in the context of the global COVID-19 pandemic, which necessitated consideration that the patient might be at risk for infection with the SARS-CoV-2 virus that causes COVID-19. Institutional protocols and algorithms that pertain to the evaluation of patients at risk for COVID-19 are in a state of rapid change based on information released by regulatory bodies including the CDC and federal and state organizations. These policies and algorithms were followed during the patient's care in the ED.   Who comes the ED due to subacute low back pain after a trip and fall at home.  X-rays show multiple spinal compression fractures in the lower thoracic vertebrae.  She is neurologically intact, pain is not severe and unbearable.  I will request case management to reach out for home health and physical therapy services.  Also advised that she can call her primary care doctor, Dr. Netty Starring on Monday to follow-up in clinic and obtain home health or acute rehab services as needed.  If she is managing outpatient she can follow-up with orthopedics as well.  Limited prescription of tramadol for acute pain, Tylenol as well for pain control.  CT head negative.  Vital signs unremarkable, appears medically stable.      ____________________________________________   FINAL CLINICAL IMPRESSION(S) / ED DIAGNOSES    Final diagnoses:  Compression fracture of T11 vertebra, initial encounter ([place])  Strain of lumbar region, initial encounter     ED Discharge Orders         Ordered    traMADol (ULTRAM) 50 MG tablet  Every 6 hours PRN     12/28/18 0212          Portions of this note were generated with dragon dictation software. Dictation errors may occur despite best attempts at proofreading.   Carrie Mew, MD 12/28/18 [phone]

## 2018-12-29 NOTE — Progress Notes (Signed)
Medicare.gov - the Conservation officer, historic buildings for [place] health agencies that serve [phone]. [place] Quality of Patient Care Rating Patient Survey Summary Rating [facility] [phone] 3 out of 5 stars 5 out of [place] [phone]) [phone] 4  out of 5 stars 3 out of [place] [phone] 3  out of 5 stars 4 out of [place] [phone]) [phone] 4 out of 5 stars 4 out of [place] [phone] 4 out of 5 stars 4 out of [place] [phone] 4  out of 5 stars 4 out of [place] [phone] 4 out of 5 stars 4 out of [place] [phone] 4 out of 5 stars 3 out of [place] AGE [phone] 3 out of 5 stars 3 out of [place] [phone] 3 out of 5 stars 3 out of 5 stars [facility] [place] [phone] 3  out of 5 stars 4 out of [place] [phone] 3 out of 5 stars 4 out of [place] [phone] 4 out of 5 stars 4 out of [place] [phone] 3  out of 5 stars 4 out of [place] [phone]) [phone] 3 out of 5 stars 4 out of [place] [phone] 2  out of 5 stars 4 out of [place] [phone] 4 out of 5 stars 4 out of 5 stars [facility] [phone] 4 out of 5 stars 4 out of [place] [phone] 2  out of 5 stars 4 out of [place] [phone] 4  out of 5 stars 3 out of [place] [phone] 4  out of 5 stars 2 out of [place] number Footnote as displayed on [place] 1 This agency provides services under a federal waiver program to non-traditional, chronic long term population. 2 This agency provides services to a special needs population. 3 Not Available. 4 The number of patient episodes for this measure is too small to report. 5 This measure currently does not have data or provider has been certified/recertified for less than 6 months. 6 The national average for this measure is not provided because of state-to-state differences in data collection. 7 Medicare is not displaying rates for this measure for any home health agency, because of an issue with the data. 8 There were problems with the data and they are being corrected. 9 Zero, or very few, patients met the survey's rules for inclusion. The scores shown, if any, reflect a very small number of surveys and may not accurately tell how an agency is doing. 10 Survey results are based on less than 12 months of data. 11 Fewer than 70 patients completed the survey. Use the scores shown, if any, with caution as the number of surveys may be too low to accurately tell how an agency is doing. 12 No survey results are available for this period. 13 Data suppressed  by CMS for one or more quarters.

## 2018-12-29 NOTE — TOC Initial Note (Addendum)
Transition of Care [facility]) - Initial/Assessment Note    Patient Details  Name: [name] MRN: [id number] Date of Birth: February 01, 1932  Transition of Care [facility]) CM/SW Contact:    Mackenzie Rasher, RN Phone Number: [phone] 12/29/2018, 3:32 PM  Clinical Narrative:                 Spoke to pt's POA, dtr [name] via phone. Pt states pt was living in her home independently until her fall. She has appt to follow up with Ortho on Wed. Educated dtr on [facility] vs private duty Database administrator. Dtr states currently pt will be living with her until they can work out a plan long-term. Offered choice for [facility]. Dtr agreeable to agency that will accept insurance. Contacted [facility] with new referral. [facility] cannot accept referral. Contacted [facility] and they are unable to accept referral. Contacted [facility], left message.    Message sent back from [facility], they accept referral. Requested they contact dtr to arrange visits.   Expected Discharge Plan: [place] Barriers to Discharge: No Barriers Identified   Patient Goals and CMS Choice Patient states their goals for this hospitalization and ongoing recovery are:: patient wants to remain independent CMS Medicare.gov Compare Post Acute Care list provided to:: Patient Represenative (must comment)(daughter, [name]) Choice offered to / list presented to : Adult Children  Expected Discharge Plan and Services Expected Discharge Plan: [place]   Discharge Planning Services: CM Consult Post Acute Care Choice: [place] arrangements for the past 2 months: Single Family Home                           HH Arranged: RN, PT, OT, Nurse's Aide, Social Work [employer] Agency: [place] ([place]) Date [place]: 12/29/18 Time [place]: [place] Representative spoke with at [place]: [name]  Prior Living Arrangements/Services Living arrangements for the past 2  months: [place] with:: Self Patient language and need for interpreter reviewed:: Yes Do you feel safe going back to the place where you live?: Yes      Need for Family Participation in Patient Care: Yes (Comment) Care giver support system in place?: Yes (comment) Current home services: DME(cane, rolling walker) Criminal Activity/Legal Involvement Pertinent to Current Situation/Hospitalization: No - Comment as needed  Activities of Daily Living      Permission Sought/Granted Permission sought to share information with : Case Manager, PCP, Family Supports Permission granted to share information with : Yes, Verbal Permission Granted  Share Information with NAME: [name]  Permission granted to share info w AGENCY: [place] granted to share info w Relationship: daughter  Permission granted to share info w Contact Information: [phone]  Emotional Assessment       Orientation: : Oriented to Self, Oriented to Place, Oriented to  Time, Oriented to Situation   Psych Involvement: No (comment)  Admission diagnosis:  back pain Patient Active Problem List   Diagnosis Date Noted  . Pneumonia 08/04/2018  . Malignant neoplasm of upper-outer quadrant of right breast in female, estrogen receptor negative ([place]) 04/29/2017  . Cervical spondylosis with radiculopathy 04/04/2016  . Recurrent UTI 10/06/2015  . Vaginal atrophy 10/06/2015  . Diabetes mellitus, type 2 ([place]) 08/02/2015  . ILD (interstitial lung disease) ([place]) 08/02/2015  . Headache, migraine 08/02/2015  . Inflammation of a vein 08/02/2015  . Anxiety and depression 10/15/2013  .  Essential (primary) hypertension 10/15/2013  . Gout 10/15/2013  . Degeneration macular 10/15/2013  . Combined fat and carbohydrate induced hyperlipemia 10/15/2013  . Arthritis, degenerative 10/15/2013  . Arthritis or polyarthritis, rheumatoid ([place]) 10/15/2013   PCP:  Dion Body, MD Pharmacy:   [facility]  STORE [place], [place] - [facility] [address] AT [address] [facility] [place] [address] Phone: [phone] Fax: [phone]     Social Determinants of Health (SDOH) Interventions    Readmission Risk Interventions No flowsheet data found.

## 2018-12-31 ENCOUNTER — Ambulatory Visit
Admission: RE | Admit: 2018-12-31 | Discharge: 2018-12-31 | Disposition: A | Discharge status: Home / Self Care | Payer: Medicare Other | Source: Ambulatory Visit | Attending: Orthopedic Surgery | Admitting: Orthopedic Surgery

## 2018-12-31 ENCOUNTER — Other Ambulatory Visit: Payer: Self-pay

## 2018-12-31 ENCOUNTER — Other Ambulatory Visit: Payer: Self-pay | Admitting: Orthopedic Surgery

## 2018-12-31 DIAGNOSIS — S22000A Wedge compression fracture of unspecified thoracic vertebra, initial encounter for closed fracture: Secondary | ICD-10-CM | POA: Diagnosis not present

## 2018-12-31 DIAGNOSIS — S32010A Wedge compression fracture of first lumbar vertebra, initial encounter for closed fracture: Secondary | ICD-10-CM | POA: Diagnosis present

## 2019-01-06 ENCOUNTER — Other Ambulatory Visit: Payer: Self-pay

## 2019-01-06 ENCOUNTER — Encounter
Admission: RE | Admit: 2019-01-06 | Discharge: 2019-01-06 | Disposition: A | Discharge status: Home / Self Care | Payer: Medicare Other | Source: Ambulatory Visit | Attending: Orthopedic Surgery | Admitting: Orthopedic Surgery

## 2019-01-06 DIAGNOSIS — H3530 Unspecified macular degeneration: Secondary | ICD-10-CM | POA: Diagnosis not present

## 2019-01-06 DIAGNOSIS — S32010A Wedge compression fracture of first lumbar vertebra, initial encounter for closed fracture: Secondary | ICD-10-CM | POA: Diagnosis not present

## 2019-01-06 DIAGNOSIS — F329 Major depressive disorder, single episode, unspecified: Secondary | ICD-10-CM | POA: Diagnosis not present

## 2019-01-06 DIAGNOSIS — E119 Type 2 diabetes mellitus without complications: Secondary | ICD-10-CM | POA: Diagnosis not present

## 2019-01-06 DIAGNOSIS — Z9011 Acquired absence of right breast and nipple: Secondary | ICD-10-CM | POA: Diagnosis not present

## 2019-01-06 DIAGNOSIS — J449 Chronic obstructive pulmonary disease, unspecified: Secondary | ICD-10-CM | POA: Diagnosis not present

## 2019-01-06 DIAGNOSIS — I10 Essential (primary) hypertension: Secondary | ICD-10-CM | POA: Diagnosis not present

## 2019-01-06 DIAGNOSIS — J849 Interstitial pulmonary disease, unspecified: Secondary | ICD-10-CM | POA: Diagnosis not present

## 2019-01-06 DIAGNOSIS — Z8041 Family history of malignant neoplasm of ovary: Secondary | ICD-10-CM | POA: Diagnosis not present

## 2019-01-06 DIAGNOSIS — M069 Rheumatoid arthritis, unspecified: Secondary | ICD-10-CM | POA: Diagnosis not present

## 2019-01-06 DIAGNOSIS — Z90710 Acquired absence of both cervix and uterus: Secondary | ICD-10-CM | POA: Diagnosis not present

## 2019-01-06 DIAGNOSIS — S22080A Wedge compression fracture of T11-T12 vertebra, initial encounter for closed fracture: Secondary | ICD-10-CM | POA: Diagnosis not present

## 2019-01-06 DIAGNOSIS — Z825 Family history of asthma and other chronic lower respiratory diseases: Secondary | ICD-10-CM | POA: Diagnosis not present

## 2019-01-06 DIAGNOSIS — M109 Gout, unspecified: Secondary | ICD-10-CM | POA: Diagnosis not present

## 2019-01-06 DIAGNOSIS — G43909 Migraine, unspecified, not intractable, without status migrainosus: Secondary | ICD-10-CM | POA: Diagnosis not present

## 2019-01-06 DIAGNOSIS — Z981 Arthrodesis status: Secondary | ICD-10-CM | POA: Diagnosis not present

## 2019-01-06 DIAGNOSIS — F419 Anxiety disorder, unspecified: Secondary | ICD-10-CM | POA: Diagnosis not present

## 2019-01-06 DIAGNOSIS — Y939 Activity, unspecified: Secondary | ICD-10-CM | POA: Diagnosis not present

## 2019-01-06 DIAGNOSIS — E1140 Type 2 diabetes mellitus with diabetic neuropathy, unspecified: Secondary | ICD-10-CM | POA: Diagnosis not present

## 2019-01-06 DIAGNOSIS — Z853 Personal history of malignant neoplasm of breast: Secondary | ICD-10-CM | POA: Diagnosis not present

## 2019-01-06 DIAGNOSIS — Z1159 Encounter for screening for other viral diseases: Secondary | ICD-10-CM | POA: Diagnosis not present

## 2019-01-06 DIAGNOSIS — X58XXXA Exposure to other specified factors, initial encounter: Secondary | ICD-10-CM | POA: Diagnosis not present

## 2019-01-06 DIAGNOSIS — Z800 Family history of malignant neoplasm of digestive organs: Secondary | ICD-10-CM | POA: Diagnosis not present

## 2019-01-06 DIAGNOSIS — M1990 Unspecified osteoarthritis, unspecified site: Secondary | ICD-10-CM | POA: Diagnosis not present

## 2019-01-06 DIAGNOSIS — Z923 Personal history of irradiation: Secondary | ICD-10-CM | POA: Diagnosis not present

## 2019-01-06 DIAGNOSIS — E785 Hyperlipidemia, unspecified: Secondary | ICD-10-CM | POA: Diagnosis not present

## 2019-01-06 HISTORY — DX: Cardiac arrhythmia, unspecified: I49.9

## 2019-01-06 LAB — CBC
HCT: 39.2 % (ref 36.0–46.0)
Hemoglobin: 13 g/dL (ref 12.0–15.0)
MCH: 29.1 pg (ref 26.0–34.0)
MCHC: 33.2 g/dL (ref 30.0–36.0)
MCV: 87.9 fL (ref 80.0–100.0)
Platelets: 217 10*3/uL (ref 150–400)
RBC: 4.46 MIL/uL (ref 3.87–5.11)
RDW: 14 % (ref 11.5–15.5)
WBC: 6.1 10*3/uL (ref 4.0–10.5)
nRBC: 0 % (ref 0.0–0.2)

## 2019-01-06 LAB — BASIC METABOLIC PANEL
Anion gap: 9 (ref 5–15)
BUN: 15 mg/dL (ref 8–23)
CO2: 26 mmol/L (ref 22–32)
Calcium: 9.7 mg/dL (ref 8.9–10.3)
Chloride: 99 mmol/L (ref 98–111)
Creatinine, Ser: 0.74 mg/dL (ref 0.44–1.00)
GFR calc Af Amer: >60 mL/min (ref >60)
GFR calc non Af Amer: >60 mL/min (ref >60)
Glucose, Bld: 196 mg/dL — ABNORMAL HIGH (ref 70–99)
Potassium: 4.4 mmol/L (ref 3.5–5.1)
Sodium: 134 mmol/L — ABNORMAL LOW (ref 135–145)

## 2019-01-06 LAB — SARS CORONAVIRUS 2 (TAT 6-24 HRS): SARS Coronavirus 2: NEGATIVE

## 2019-01-06 NOTE — Pre-Procedure Instructions (Signed)
EKG My review and personal interpretation at Time: 12:47   Indication: N/v  Rate: 80  Rhythm: sinus Axis: normal Other: rbbb and lafb, no stemi, nonsepcific st abn, abnml ekg ____________________________________________  RADIOLOGY  ,I personally reviewed all radiographic images ordered to evaluate for the above acute complaints and reviewed radiology reports and findings.  These findings were personally discussed with the patient.  Please see medical record for radiology report.  ____________________________________________   PROCEDURES  Procedure(s) performed:  .Critical Care Performed by: Merlyn Lot, MD Authorized by: Merlyn Lot, MD   Critical care provider statement:    Critical care time (minutes):  30   Critical care time was exclusive of:  Separately billable procedures and treating other patients   Critical care was necessary to treat or prevent imminent or life-threatening deterioration of the following conditions:  Respiratory failure   Critical care was time spent personally by me on the following activities:  Development of treatment plan with patient or surrogate, discussions with consultants, evaluation of patient's response to treatment, examination of patient, obtaining history from patient or surrogate, ordering and performing treatments and interventions, ordering and review of laboratory studies, ordering and review of radiographic studies, pulse oximetry, re-evaluation of patient's condition and review of old charts      Critical Care performed: yes ____________________________________________   INITIAL IMPRESSION / [place] / ED COURSE  Pertinent labs & imaging results that were available during my care of the patient were reviewed by me and considered in my medical decision making (see chart for details).  DDX: pna, bronchitis, enteritis, influenza, chf, copd  [name] is a 85 y.o. who presents to the ED  with nausea vomiting weakness as described above.  Patient with low-grade temperature here and noted to be hypoxic on room air to 88%.  She is placed on supplemental oxygen due to respiratory distress.  Did have improvement in her O2 saturations.  Her abdominal exam is benign.  Will order x-ray to evaluate for obstructive pattern.  Blood work will be ordered.  Will give IV hydration she does appear slightly dehydrated.  The patient will be placed on continuous pulse oximetry and telemetry for monitoring.  Laboratory evaluation will be sent to evaluate for the above complaints.        Clinical Course as of Aug 05 1423  Mon Aug 04, 2018  1403 Repeat abdominal exam is soft and benign.  Does seem more clinically consistent with pneumonia.   [PR]  1423 Flu is negative.  Has the patient is hypoxic on room air at 88% will discuss with hospitalist for admission for further medical management.   [PR]    Clinical Course User Index [PR] Merlyn Lot, MD     As part of my medical decision making, I reviewed the following data within the [place] notes reviewed and incorporated, Labs reviewed, notes from prior ED visits and [place] Controlled Substance Database   ____________________________________________   FINAL CLINICAL IMPRESSION(S) / ED DIAGNOSES  Final diagnoses:  Acute respiratory failure with hypoxia ([place])  Pneumonia of left lower lobe due to infectious organism [facility])      NEW MEDICATIONS STARTED DURING THIS VISIT:     New Prescriptions   No medications on file     Note:  This document was prepared using Dragon voice recognition software and may include unintentional dictation errors.    Merlyn Lot, MD 08/04/18 1425         Electronically signed by Quentin Cornwall,  Saralyn Pilar, MD at 08/04/2018 2:25 PM   ED to Hosp-Admission (Discharged) on 08/04/2018     Detailed Report

## 2019-01-06 NOTE — Patient Instructions (Signed)
Your procedure is scheduled on: 01-08-19 THURSDAY Report to Same Day Surgery 2nd floor medical mall [facility] Entrance-take elevator on left to 2nd floor.  Check in with surgery information desk.) To find out your arrival time please call [phone] between 1PM - 3PM on 01-07-19 [facility]  Remember: Instructions that are not followed completely may result in serious medical risk, up to and including death, or upon the discretion of your surgeon and anesthesiologist your surgery may need to be rescheduled.    _x___ 1. Do not eat food after midnight the night before your procedure. You may drink WATER up to 2 hours before you are scheduled to arrive at the hospital for your procedure.  Do not drink WATER within 2 hours of your scheduled arrival to the hospital.  Type 1 and type 2 diabetics should only drink water.   ____Ensure clear carbohydrate drink on the way to the hospital for bariatric patients  ____Ensure clear carbohydrate drink 3 hours before surgery for Dr Dwyane Luo patients if physician instructed.    __x__ 2. No Alcohol for 24 hours before or after surgery.   __x__3. No Smoking or e-cigarettes for 24 prior to surgery.  Do not use any chewable tobacco products for at least 6 hour prior to surgery   ____  4. Bring all medications with you on the day of surgery if instructed.    __x__ 5. Notify your doctor if there is any change in your medical condition     (cold, fever, infections).    x___6. On the morning of surgery brush your teeth with toothpaste and water.  You may rinse your mouth with mouth wash if you wish.  Do not swallow any toothpaste or mouthwash.   Do not wear jewelry, make-up, hairpins, clips or nail polish.  Do not wear lotions, powders, or perfumes. You may wear deodorant.  Do not shave 48 hours prior to surgery. Men may shave face and neck.  Do not bring valuables to the hospital.    [facility] is not responsible for any belongings or valuables.           Contacts, dentures or bridgework may not be worn into surgery.  Leave your suitcase in the car. After surgery it may be brought to your room.  For patients admitted to the hospital, discharge time is determined by your treatment team.  _  Patients discharged the day of surgery will not be allowed to drive home.  You will need someone to drive you home and stay with you the night of your procedure.    Please read over the following fact sheets that you were given:   [facility] Preparing for Surgery   _x___ TAKE THE FOLLOWING MEDICATION THE MORNING OF SURGERY WITH A SMALL SIP OF WATER. These include:  1. ALLOPURINOL   2. AMLODIPINE (NORVASC)  3. BUSPAR (BUSPIRONE)  4. COREG (CARVEDILOL)  5. PAXIL (PAROXETINE)  6.YOU MAY TAKE TRAMADOL DAY OF SURGERY IF NEEDED  ____Fleets enema or Magnesium Citrate as directed.   _x___ Use CHG Soap or sage wipes as directed on instruction sheet   ____ Use inhalers on the day of surgery and bring to hospital day of surgery  _X___ Stop Metformin 2 days prior to surgery-STOP NOW (01-06-19)   ____ Take 1/2 of usual insulin dose the night before surgery and none on the morning  surgery.   _x___ Follow recommendations from Cardiologist, Pulmonologist or PCP regarding  stopping Aspirin, Coumadin, Plavix ,Eliquis, Effient, or  Pradaxa, and Pletal-STOP ASPIRIN NOW  X____Stop Anti-inflammatories such as Advil, Aleve, Ibuprofen, Motrin, Naproxen, Naprosyn, Goodies powders or aspirin products. OK to take Tylenol OR ULTRAM OK IF NEEDED   _x___ Stop supplements until after surgery-STOP CRANBERRY AND PRESERVISION NOW-MAY RESUME AFTER SURGERY   ____ Bring C-Pap to the hospital.

## 2019-01-07 MED ORDER — CLINDAMYCIN PHOSPHATE 900 MG/50ML IV SOLN
900.0000 mg | Freq: Once | INTRAVENOUS | Status: AC
  Administered 2019-01-08: 08:00:00 600 mg via INTRAVENOUS

## 2019-01-08 ENCOUNTER — Other Ambulatory Visit: Payer: Self-pay

## 2019-01-08 ENCOUNTER — Ambulatory Visit: Payer: Medicare Other

## 2019-01-08 ENCOUNTER — Ambulatory Visit: Payer: Medicare Other | Admitting: Certified Registered"

## 2019-01-08 ENCOUNTER — Ambulatory Visit
Admission: RE | Admit: 2019-01-08 | Discharge: 2019-01-08 | Disposition: A | Discharge status: Home / Self Care | Payer: Medicare Other | Attending: Orthopedic Surgery | Admitting: Orthopedic Surgery

## 2019-01-08 ENCOUNTER — Encounter: Payer: Self-pay | Admitting: *Deleted

## 2019-01-08 ENCOUNTER — Encounter
Admission: RE | Disposition: A | Discharge status: Home / Self Care | Payer: Self-pay | Source: Home / Self Care | Attending: Orthopedic Surgery

## 2019-01-08 DIAGNOSIS — Z7984 Long term (current) use of oral hypoglycemic drugs: Secondary | ICD-10-CM | POA: Insufficient documentation

## 2019-01-08 DIAGNOSIS — Z923 Personal history of irradiation: Secondary | ICD-10-CM | POA: Insufficient documentation

## 2019-01-08 DIAGNOSIS — Z9011 Acquired absence of right breast and nipple: Secondary | ICD-10-CM | POA: Insufficient documentation

## 2019-01-08 DIAGNOSIS — Z7951 Long term (current) use of inhaled steroids: Secondary | ICD-10-CM | POA: Insufficient documentation

## 2019-01-08 DIAGNOSIS — G43909 Migraine, unspecified, not intractable, without status migrainosus: Secondary | ICD-10-CM | POA: Insufficient documentation

## 2019-01-08 DIAGNOSIS — E119 Type 2 diabetes mellitus without complications: Secondary | ICD-10-CM | POA: Insufficient documentation

## 2019-01-08 DIAGNOSIS — E1140 Type 2 diabetes mellitus with diabetic neuropathy, unspecified: Secondary | ICD-10-CM | POA: Insufficient documentation

## 2019-01-08 DIAGNOSIS — Z882 Allergy status to sulfonamides status: Secondary | ICD-10-CM | POA: Insufficient documentation

## 2019-01-08 DIAGNOSIS — Z981 Arthrodesis status: Secondary | ICD-10-CM | POA: Insufficient documentation

## 2019-01-08 DIAGNOSIS — Y939 Activity, unspecified: Secondary | ICD-10-CM | POA: Insufficient documentation

## 2019-01-08 DIAGNOSIS — J449 Chronic obstructive pulmonary disease, unspecified: Secondary | ICD-10-CM | POA: Insufficient documentation

## 2019-01-08 DIAGNOSIS — Z79899 Other long term (current) drug therapy: Secondary | ICD-10-CM | POA: Insufficient documentation

## 2019-01-08 DIAGNOSIS — Z1159 Encounter for screening for other viral diseases: Secondary | ICD-10-CM | POA: Insufficient documentation

## 2019-01-08 DIAGNOSIS — F419 Anxiety disorder, unspecified: Secondary | ICD-10-CM | POA: Insufficient documentation

## 2019-01-08 DIAGNOSIS — Z8041 Family history of malignant neoplasm of ovary: Secondary | ICD-10-CM | POA: Insufficient documentation

## 2019-01-08 DIAGNOSIS — E785 Hyperlipidemia, unspecified: Secondary | ICD-10-CM | POA: Insufficient documentation

## 2019-01-08 DIAGNOSIS — S32010A Wedge compression fracture of first lumbar vertebra, initial encounter for closed fracture: Secondary | ICD-10-CM | POA: Insufficient documentation

## 2019-01-08 DIAGNOSIS — X58XXXA Exposure to other specified factors, initial encounter: Secondary | ICD-10-CM | POA: Insufficient documentation

## 2019-01-08 DIAGNOSIS — Z888 Allergy status to other drugs, medicaments and biological substances status: Secondary | ICD-10-CM | POA: Insufficient documentation

## 2019-01-08 DIAGNOSIS — Z800 Family history of malignant neoplasm of digestive organs: Secondary | ICD-10-CM | POA: Insufficient documentation

## 2019-01-08 DIAGNOSIS — J849 Interstitial pulmonary disease, unspecified: Secondary | ICD-10-CM | POA: Insufficient documentation

## 2019-01-08 DIAGNOSIS — Z8371 Family history of colonic polyps: Secondary | ICD-10-CM | POA: Insufficient documentation

## 2019-01-08 DIAGNOSIS — F329 Major depressive disorder, single episode, unspecified: Secondary | ICD-10-CM | POA: Insufficient documentation

## 2019-01-08 DIAGNOSIS — Z825 Family history of asthma and other chronic lower respiratory diseases: Secondary | ICD-10-CM | POA: Insufficient documentation

## 2019-01-08 DIAGNOSIS — Z880 Allergy status to penicillin: Secondary | ICD-10-CM | POA: Insufficient documentation

## 2019-01-08 DIAGNOSIS — S22080A Wedge compression fracture of T11-T12 vertebra, initial encounter for closed fracture: Secondary | ICD-10-CM | POA: Diagnosis not present

## 2019-01-08 DIAGNOSIS — Z419 Encounter for procedure for purposes other than remedying health state, unspecified: Secondary | ICD-10-CM

## 2019-01-08 DIAGNOSIS — Z881 Allergy status to other antibiotic agents status: Secondary | ICD-10-CM | POA: Insufficient documentation

## 2019-01-08 DIAGNOSIS — H3530 Unspecified macular degeneration: Secondary | ICD-10-CM | POA: Insufficient documentation

## 2019-01-08 DIAGNOSIS — M1990 Unspecified osteoarthritis, unspecified site: Secondary | ICD-10-CM | POA: Insufficient documentation

## 2019-01-08 DIAGNOSIS — M069 Rheumatoid arthritis, unspecified: Secondary | ICD-10-CM | POA: Insufficient documentation

## 2019-01-08 DIAGNOSIS — Z853 Personal history of malignant neoplasm of breast: Secondary | ICD-10-CM | POA: Insufficient documentation

## 2019-01-08 DIAGNOSIS — Z90710 Acquired absence of both cervix and uterus: Secondary | ICD-10-CM | POA: Insufficient documentation

## 2019-01-08 DIAGNOSIS — I10 Essential (primary) hypertension: Secondary | ICD-10-CM | POA: Insufficient documentation

## 2019-01-08 DIAGNOSIS — M109 Gout, unspecified: Secondary | ICD-10-CM | POA: Insufficient documentation

## 2019-01-08 HISTORY — PX: KYPHOPLASTY: SHX5884

## 2019-01-08 LAB — GLUCOSE, CAPILLARY
Glucose-Capillary: 160 mg/dL — ABNORMAL HIGH (ref 70–99)
Glucose-Capillary: 174 mg/dL — ABNORMAL HIGH (ref 70–99)

## 2019-01-08 SURGERY — KYPHOPLASTY
Anesthesia: General | Site: Back

## 2019-01-08 MED ORDER — FENTANYL CITRATE (PF) 100 MCG/2ML IJ SOLN
INTRAMUSCULAR | Status: DC | PRN
  Administered 2019-01-08: 25 ug via INTRAVENOUS

## 2019-01-08 MED ORDER — PROPOFOL 10 MG/ML IV BOLUS
INTRAVENOUS | Status: AC
  Filled 2019-01-08: qty 20

## 2019-01-08 MED ORDER — HYDROCODONE-ACETAMINOPHEN 5-325 MG PO TABS: 1.0000 | ORAL_TABLET | Freq: Four times a day (QID) | ORAL | 0 refills | Status: DC | PRN

## 2019-01-08 MED ORDER — FAMOTIDINE 20 MG PO TABS
ORAL_TABLET | ORAL | Status: AC
  Administered 2019-01-08: 08:00:00 20 mg via ORAL
  Filled 2019-01-08: qty 1

## 2019-01-08 MED ORDER — FENTANYL CITRATE (PF) 100 MCG/2ML IJ SOLN
25.0000 ug | INTRAMUSCULAR | Status: DC | PRN
  Administered 2019-01-08 (×2): 25 ug via INTRAVENOUS

## 2019-01-08 MED ORDER — HYDROCODONE-ACETAMINOPHEN 5-325 MG PO TABS
ORAL_TABLET | ORAL | Status: AC
  Filled 2019-01-08: qty 1

## 2019-01-08 MED ORDER — PROPOFOL 500 MG/50ML IV EMUL
INTRAVENOUS | Status: DC | PRN
  Administered 2019-01-08: 50 ug/kg/min via INTRAVENOUS

## 2019-01-08 MED ORDER — FENTANYL CITRATE (PF) 100 MCG/2ML IJ SOLN
INTRAMUSCULAR | Status: AC
  Filled 2019-01-08: qty 2

## 2019-01-08 MED ORDER — PROPOFOL 10 MG/ML IV BOLUS
INTRAVENOUS | Status: DC | PRN
  Administered 2019-01-08 (×2): 20 mg via INTRAVENOUS

## 2019-01-08 MED ORDER — HYDROCODONE-ACETAMINOPHEN 5-325 MG PO TABS
1.0000 | ORAL_TABLET | Freq: Four times a day (QID) | ORAL | Status: DC | PRN
  Administered 2019-01-08: 1 via ORAL

## 2019-01-08 MED ORDER — IOHEXOL 180 MG/ML  SOLN
INTRAMUSCULAR | Status: DC | PRN
  Administered 2019-01-08: 40 mL

## 2019-01-08 MED ORDER — FAMOTIDINE 20 MG PO TABS
20.0000 mg | ORAL_TABLET | Freq: Once | ORAL | Status: AC
  Administered 2019-01-08: 08:00:00 20 mg via ORAL

## 2019-01-08 MED ORDER — CLINDAMYCIN PHOSPHATE 900 MG/50ML IV SOLN
INTRAVENOUS | Status: AC
  Filled 2019-01-08: qty 50

## 2019-01-08 MED ORDER — ONDANSETRON HCL 4 MG/2ML IJ SOLN: 4.0000 mg | Freq: Once | INTRAMUSCULAR | Status: DC | PRN

## 2019-01-08 MED ORDER — BUPIVACAINE-EPINEPHRINE (PF) 0.5% -1:200000 IJ SOLN
INTRAMUSCULAR | Status: DC | PRN
  Administered 2019-01-08: 20 mL

## 2019-01-08 MED ORDER — SODIUM CHLORIDE 0.9 % IV SOLN
INTRAVENOUS | Status: DC
  Administered 2019-01-08: 08:00:00 via INTRAVENOUS

## 2019-01-08 MED ORDER — PROPOFOL 10 MG/ML IV BOLUS: INTRAVENOUS | Status: DC | PRN

## 2019-01-08 MED ORDER — LIDOCAINE HCL 1 % IJ SOLN
INTRAMUSCULAR | Status: DC | PRN
  Administered 2019-01-08: 10 mL

## 2019-01-08 SURGICAL SUPPLY — 20 items

## 2019-01-08 NOTE — H&P (Addendum)
Reviewed paper H+P, will be scanned into chart. No changes noted. MRI showed acute T12 and L1 fractures the previously described T4 fracture prior studies is chronic.  Plan is for T12 and L1 kyphoplasty today.

## 2019-01-08 NOTE — Discharge Instructions (Addendum)
Take it easy today and tomorrow.  Okay to remove Band-Aid on Saturday and then okay to shower.  Try not to lift anything over 5 pounds for 2 weeks.  Pain medicine as directed.   AMBULATORY SURGERY  DISCHARGE INSTRUCTIONS   1) The drugs that you were given will stay in your system until tomorrow so for the next 24 hours you should not:  A) Drive an automobile B) Make any legal decisions C) Drink any alcoholic beverage   2) You may resume regular meals tomorrow.  Today it is better to start with liquids and gradually work up to solid foods.  You may eat anything you prefer, but it is better to start with liquids, then soup and crackers, and gradually work up to solid foods.   3) Please notify your doctor immediately if you have any unusual bleeding, trouble breathing, redness and pain at the surgery site, drainage, fever, or pain not relieved by medication.    4) Additional Instructions:        Please contact your physician with any problems or Same Day Surgery at [phone], Monday through Friday 6 am to 4 pm, or [place] at [facility] number at [phone].

## 2019-01-08 NOTE — Transfer of Care (Signed)
Immediate Anesthesia Transfer of Care Note  Patient: [name]  Procedure(s) Performed: T-12, L1 KYPHOPLASTY (N/A Back)  Patient Location: PACU  Anesthesia Type:MAC  Level of Consciousness: awake  Airway & Oxygen Therapy: Patient Spontanous Breathing  Post-op Assessment: Report given to RN and Post -op Vital signs reviewed and stable  Post vital signs: Reviewed and stable  Last Vitals:  Vitals Value Taken Time  BP    Temp    Pulse    Resp    SpO2      Last Pain:  Vitals:   01/08/19 0721  TempSrc: Temporal  PainSc: 5          Complications: No apparent anesthesia complications

## 2019-01-08 NOTE — Anesthesia Post-op Follow-up Note (Signed)
Anesthesia QCDR form completed.        

## 2019-01-08 NOTE — Anesthesia Postprocedure Evaluation (Signed)
Anesthesia Post Note  Patient: [name]  Procedure(s) Performed: T-12, L1 KYPHOPLASTY (N/A Back)  Patient location during evaluation: PACU Anesthesia Type: General Level of consciousness: awake and alert and oriented Pain management: pain level controlled Vital Signs Assessment: post-procedure vital signs reviewed and stable Respiratory status: spontaneous breathing Cardiovascular status: blood pressure returned to baseline Anesthetic complications: no     Last Vitals:  Vitals:   01/08/19 1002 01/08/19 1039  BP: (!) 119/102 (!) 148/66  Pulse: 66   Resp: 20 18  Temp: (!) 36.4 C   SpO2: 95% 97%    Last Pain:  Vitals:   01/08/19 1002  TempSrc: Temporal  PainSc: 0-No pain                 Samayah Novinger

## 2019-01-08 NOTE — Op Note (Signed)
Date January 08, 2019  time 8:57 AM   PATIENT:  [name]   PRE-OPERATIVE DIAGNOSIS:  closed wedge compression fracture of T12 and L1   POST-OPERATIVE DIAGNOSIS:  closed wedge compression fracture of T12 and L1   PROCEDURE:  Procedure(s): KYPHOPLASTY T12 and L1  SURGEON: Laurene Footman, MD   ASSISTANTS: None   ANESTHESIA:   local and MAC   EBL:  No intake/output data recorded.   BLOOD ADMINISTERED:none   DRAINS: none    LOCAL MEDICATIONS USED:  MARCAINE    and XYLOCAINE    SPECIMEN:   T12 and L1 vertebral body biopsies   DISPOSITION OF SPECIMEN:  Pathology   COUNTS:  YES   TOURNIQUET:  * No tourniquets in log *   IMPLANTS: Bone cement   DICTATION: .Dragon Dictation  patient was brought to the operating room and after adequate anesthesia was obtained the patient was placed prone.  C arm was brought in in good visualization of the affected level obtained on both AP and lateral projections.  After patient identification and timeout procedures were completed, local anesthetic was infiltrated with 10 cc 1% Xylocaine infiltrated subcutaneously.  This is done the area on the right side of the planned approach.  The back was then prepped and draped in the usual sterile manner and repeat timeout procedure carried out.  A spinal needle was brought down to the pedicle on the right side of  L1 and T12 and a 50-50 mix of 1% Xylocaine half percent Sensorcaine with epinephrine total of 20 cc injected.  After allowing this to set a small incision was made and the trocar was advanced into the vertebral body in an extrapedicular fashion.  Biopsy was obtained at both levels Drilling was carried out balloon inserted with inflation to  2 cc at L1 and 3 cc at T12.  When the cement was appropriate consistency 3-1/2 cc at each level were injected into the vertebral body without extravasation, good fill superior to inferior endplates and from right to left sides along the inferior endplate.  After the  cement had set the trochar was removed and permanent C-arm views obtained.  The wound was closed with Dermabond followed by Band-Aid   PLAN OF CARE: Discharge to home after PACU   PATIENT DISPOSITION:  PACU - hemodynamically stable.

## 2019-01-08 NOTE — Anesthesia Preprocedure Evaluation (Signed)
Anesthesia Evaluation  Patient identified by MRN, date of birth, ID band Patient awake    Reviewed: Allergy & Precautions, NPO status , Patient's Chart, lab work & pertinent test results, reviewed documented beta blocker date and time   History of Anesthesia Complications Negative for: history of anesthetic complications  Airway Mallampati: II  TM Distance: >3 FB Neck ROM: Full    Dental  (+) Edentulous Lower, Edentulous Upper, Dental Advisory Given   Pulmonary COPD,  Interstitial Lung Disease   Pulmonary exam normal        Cardiovascular hypertension, Pt. on medications and Pt. on home beta blockers Normal cardiovascular exam     Neuro/Psych  Headaches, PSYCHIATRIC DISORDERS Anxiety Depression    GI/Hepatic negative GI ROS, Neg liver ROS,   Endo/Other  diabetes  Renal/GU negative Renal ROS     Musculoskeletal  (+) Arthritis , Osteoarthritis,    Abdominal   Peds  Hematology   Anesthesia Other Findings Past Medical History: No date: Anxiety 2015: Breast cancer ([place])     Comment:  right- radiation No date: Cough No date: Depression No date: Diabetes mellitus without complication (HCC) No date: Dysrhythmia No date: Gout No date: HLD (hyperlipidemia) No date: HTN (hypertension) No date: Hypertension No date: Interstitial lung disease (HCC) No date: Lumbar spinal stenosis No date: Macular degeneration No date: Migraines No date: Osteoarthritis No date: Personal history of radiation therapy No date: Phlebitis 08/2018: Pneumonia No date: Pulmonary nodule No date: Rheumatoid arthritis ([place])  Reproductive/Obstetrics                             Anesthesia Physical  Anesthesia Plan  ASA: III  Anesthesia Plan: General   Post-op Pain Management:    Induction: Intravenous  PONV Risk Score and Plan: TIVA  Airway Management Planned: Nasal Cannula  Additional Equipment:    Intra-op Plan:   Post-operative Plan:   Informed Consent: I have reviewed the patients History and Physical, chart, labs and discussed the procedure including the risks, benefits and alternatives for the proposed anesthesia with the patient or authorized representative who has indicated his/her understanding and acceptance.     Dental advisory given  Plan Discussed with: CRNA, Anesthesiologist and Surgeon  Anesthesia Plan Comments:         Anesthesia Quick Evaluation

## 2019-01-09 ENCOUNTER — Encounter: Payer: Self-pay | Admitting: Orthopedic Surgery

## 2019-01-13 LAB — SURGICAL PATHOLOGY

## 2019-01-22 ENCOUNTER — Ambulatory Visit: Payer: Medicare Other | Admitting: Radiation Oncology

## 2019-03-19 ENCOUNTER — Ambulatory Visit: Payer: Medicare Other | Admitting: Radiation Oncology

## 2019-04-07 ENCOUNTER — Other Ambulatory Visit: Payer: Self-pay

## 2019-04-08 ENCOUNTER — Other Ambulatory Visit: Payer: Self-pay | Admitting: *Deleted

## 2019-04-08 ENCOUNTER — Ambulatory Visit
Admission: RE | Admit: 2019-04-08 | Discharge: 2019-04-08 | Disposition: A | Discharge status: Home / Self Care | Payer: Medicare Other | Source: Ambulatory Visit | Attending: Radiation Oncology | Admitting: Radiation Oncology

## 2019-04-08 DIAGNOSIS — C50411 Malignant neoplasm of upper-outer quadrant of right female breast: Secondary | ICD-10-CM

## 2019-04-08 DIAGNOSIS — Z171 Estrogen receptor negative status [ER-]: Secondary | ICD-10-CM

## 2019-04-08 NOTE — Progress Notes (Signed)
Radiation Oncology TeleHEALTH VISIT PROGRESS NOTE  I connected with     [name]    by telephone-Webex and verified that I am speaking with the correct person using two identifiers.  I discussed the limitations, risks, security and privacy concerns of performing an evaluation and management service by telemedicine and the availability of in-person appointments. I also discussed with the patient that there may be a patient responsible charge related to this service. The patient expressed understanding and agreed to proceed.    Other persons participating in the visit and their role in the encounter: None   Patient's location: Home   Provider's location: work  Radiation Oncology Follow up Note  Name: [name]   Date:   04/08/2019 MRN:  [id number] DOB: 25-Mar-1932    This 85 y.o. 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  REFERRING PROVIDER: Dion Body, MD  HPI: Patient is an 85 year old female now seen at over 4 years having completed whole breast radiation to her right breast for triple negative invasive mammary carcinoma seen today in routine follow-up she is doing well she specifically denies breast tenderness cough or bone pain.  She has asked about shrinkage of the right breast which I explained to her is a normal outcome from radiation therapy..  She had mammograms back in December which I have reviewed were BI-RADS 2 benign.  Based on the triple negative nature of her disease she is not on antiestrogen therapy.  COMPLICATIONS OF TREATMENT: none  FOLLOW UP COMPLIANCE: keeps appointments   PHYSICAL EXAM:  There were no vitals taken for this visit. No physical exam was performed since this was a telephone visit.  RADIOLOGY RESULTS: Mammograms reviewed compatible with above-stated findings  PLAN: Present time patient is now out over 4 years with no  evidence of disease.  Based on her advanced age and timing of her follow-ups I am going to discontinue follow-up care.  I have reordered diagnostic mammograms for next December.  Of asked the patient to make sure she is having yearly mammograms.  Patient comprehends my recommendations well.  I would like to take this opportunity to thank you for allowing me to participate in the care of your patient.Noreene Filbert, MD

## 2019-07-08 ENCOUNTER — Ambulatory Visit
Admission: RE | Admit: 2019-07-08 | Discharge: 2019-07-08 | Disposition: A | Discharge status: Home / Self Care | Payer: Medicare Other | Source: Ambulatory Visit | Attending: Radiation Oncology | Admitting: Radiation Oncology

## 2019-07-08 DIAGNOSIS — C50411 Malignant neoplasm of upper-outer quadrant of right female breast: Secondary | ICD-10-CM | POA: Insufficient documentation

## 2019-07-08 DIAGNOSIS — Z171 Estrogen receptor negative status [ER-]: Secondary | ICD-10-CM | POA: Insufficient documentation

## 2020-04-06 ENCOUNTER — Other Ambulatory Visit: Payer: Self-pay | Admitting: Family Medicine

## 2020-04-06 DIAGNOSIS — R42 Dizziness and giddiness: Secondary | ICD-10-CM

## 2020-04-13 ENCOUNTER — Other Ambulatory Visit: Payer: Self-pay

## 2020-04-13 ENCOUNTER — Ambulatory Visit
Admission: RE | Admit: 2020-04-13 | Discharge: 2020-04-13 | Disposition: A | Discharge status: Home / Self Care | Payer: Medicare Other | Source: Ambulatory Visit | Attending: Family Medicine | Admitting: Family Medicine

## 2020-04-13 DIAGNOSIS — R42 Dizziness and giddiness: Secondary | ICD-10-CM | POA: Diagnosis not present

## 2020-07-15 DIAGNOSIS — R001 Bradycardia, unspecified: Secondary | ICD-10-CM | POA: Diagnosis present

## 2020-08-01 DIAGNOSIS — R5381 Other malaise: Secondary | ICD-10-CM | POA: Insufficient documentation

## 2020-08-04 DIAGNOSIS — R8271 Bacteriuria: Secondary | ICD-10-CM | POA: Insufficient documentation

## 2020-08-20 DIAGNOSIS — R059 Cough, unspecified: Secondary | ICD-10-CM | POA: Insufficient documentation

## 2020-09-01 ENCOUNTER — Telehealth: Payer: Self-pay

## 2020-09-01 NOTE — Telephone Encounter (Signed)
Attempted to contact patient's daughter Amy to schedule a Palliative Care consult appointment. No answer left a message to return call.

## 2020-09-01 NOTE — Telephone Encounter (Signed)
Spoke with patient's daughter Amy and scheduled an in-person Palliative Consult for 09/05/20 @ 2PM  COVID screening was negative. No pets in home. Patient lives with daughter Amy.  Consent obtained; updated Outlook/Netsmart/Team List and Epic.  Family is aware they may be receiving a call from NP the day before or day of to confirm appointment.

## 2020-09-05 ENCOUNTER — Other Ambulatory Visit: Payer: Medicare Other | Admitting: Primary Care

## 2020-09-05 ENCOUNTER — Other Ambulatory Visit: Payer: Self-pay

## 2020-09-05 DIAGNOSIS — M4722 Other spondylosis with radiculopathy, cervical region: Secondary | ICD-10-CM

## 2020-09-05 DIAGNOSIS — N390 Urinary tract infection, site not specified: Secondary | ICD-10-CM

## 2020-09-05 DIAGNOSIS — M0579 Rheumatoid arthritis with rheumatoid factor of multiple sites without organ or systems involvement: Secondary | ICD-10-CM

## 2020-09-05 DIAGNOSIS — J849 Interstitial pulmonary disease, unspecified: Secondary | ICD-10-CM

## 2020-09-05 NOTE — Progress Notes (Signed)
[facility] Consult Note Telephone: [phone]  Fax: [phone]    Date of encounter: 09/05/20 PATIENT NAME: [name] [address] [place] [address] [phone] (home)  DOB: 1932/01/21 MRN: [id number]  PRIMARY CARE PROVIDER:    Dion Body, MD,  [place] [address] [phone]  REFERRING PROVIDER:   Dion Body, MD [place] [facility] [place],  [place] [address] [phone]  RESPONSIBLE PARTY:   Extended Emergency Contact Information Primary Emergency Contact: [facility] Address: [place], [place] [address] [place] Phone: [phone] Mobile Phone: [phone] Relation: Daughter Secondary Emergency Contact: Colin Benton Address: [address] [place]          [place],  [address] [place] Phone: [phone] Relation: Daughter  I met face to face with patient and family in  home. Palliative Care was asked to follow this patient by consultation request of Dion Body, MD to help address advance care planning and goals of care. This is a the initial  visit.   ASSESSMENT AND RECOMMENDATIONS:   1. Advance Care Planning/Goals of Care: Goals include to maximize quality of life and symptom management. Our advance care planning conversation included a discussion about:     The value and importance of advance care planning   Exploration of personal, cultural or spiritual beliefs that might influence medical decisions   Exploration of goals of care in the event of a sudden injury or illness   Identification of a healthcare agent -nothing official but daughter is acting. Discussed advantages to doing formal POA.  Creation of an  advance directive document- For DNR and MOST with comfort measures  Decision to de-escalate disease focused treatments due to poor prognosis.  We  discussed recent disease process. Pt identified she was tired of going to doctor appointments, and did not want to pursue pulmonary work up. She and daughter want to focus on symptom management at this point. They have recently moved to a new apartment in order for daughter to provide daily care. We prepared DNR form and MOST with DNR, Comfort measures, abx, limited iv, no feeding tube. Pt voices she does not want to be prolonged. Uploaded to [facility].  I spent 20 minutes providing this consultation,  from 1300 to 1320. More than 50% of the time in this consultation was spent in counseling and care coordination. __________________________________________________________________________  2. Symptom Management:   Dyspnea: Dislikes the oxygen and tubing, we did a walk test in the home and she was able to maintain sats at 92% with exercise. She chooses to use it prn which I recommend for QOL goals. She declines  work up  pulmonary due to fatigue and choosing de escalation of medical interventions.  There are no nebulizers/inhalers currently and I recommend  that she try some to see if her breathing is improved. She could trial albuterol hfa, advair, etc. She may rather use nebulizer for ease, so recommend Budesonide and duo nebs bid and dubnebs q 6 hr prn. She will also need a nebulizer machine as she has not had one in the past.  Caregiver strain: Daughter has entered this role recently and is looking for support. We discussed PACE services, which she will call to discuss. I also recommended her to call DSS to assess for eligibility for in home PCS help. We discussed her  hiring a companion so she can work from home, and prioritizing self care.  Disposition: Patient may be eligible for hospice services as she has elected to de escalate medical interventions. Goals of care are consistent, and we discussed the 6 month prognosis condition of enrollment.  We discussed future exacerbations of her disease or  infections. She states her goal is to avoid hospital ED presentation. I would recommend her having  A prescription for prenisone burst and respiratory antibiotic for empiric treatment. If that fails we discussed transitioning to hospice without further work up.   3. Follow up Palliative Care Visit: Palliative care will continue to follow for goals of care clarification and symptom management. Return 3- 4 weeks or prn.  4. Family /Caregiver/Community Supports: Lives with daughter, have recently moved to apartment. Not connected to community support services at this time. Will call PACE, DSS.  5. Cognitive / Functional decline: A and O x 2, HOH. Needs assistance with all iadls, most adls.  CODE STATUS: DNR  PPS: 40%  HOSPICE ELIGIBILITY/DIAGNOSIS: yes with concordant goals of care. Currently taking humira. Interstitial lung disease  Subjective:  CHIEF COMPLAINT: Dyspnea  HISTORY OF PRESENT ILLNESS:  [name] is a 87 y.o. year old female  with Interstitial lung disease, Dyspnea, DM, Degenerative arthritis, RA. Presents with debility from recent pulmonary infection and deconditioning. She endorses it is harder for her to perform her adls and she needs help. She tires easily. She wants to not pursue aggressive medical management but to be maintained at home with goals of maintaining quality of life. She has SOB in context of interstitial lung disease that is not really helped subjectively by oxygen. She does not have nebulizers or inhalers to treat her sx .   We are asked to consult around advance care planning and complex medical decision making.    Review and summarization of old Epic records shows or history from other than patient.  Review or lab tests, radiology,  or medicine. RE labs, scans, xrays, EKG from [facility] hospital stay 2/22. Review of case with family member. Daughter [name]  History obtained from review of EMR, discussion with primary team, and  interview with family,  caregiver  and/or Ms. Heino. Records reviewed and summarized above.   CURRENT PROBLEM LIST:  Patient Active Problem List   Diagnosis Date Noted  . Pneumonia 08/04/2018  . Malignant neoplasm of upper-outer quadrant of right breast in female, estrogen receptor negative ([place]) 04/29/2017  . Cervical spondylosis with radiculopathy 04/04/2016  . Recurrent UTI 10/06/2015  . Vaginal atrophy 10/06/2015  . Diabetes mellitus, type 2 ([place]) 08/02/2015  . ILD (interstitial lung disease) ([place]) 08/02/2015  . Headache, migraine 08/02/2015  . Inflammation of a vein 08/02/2015  . Anxiety and depression 10/15/2013  . Essential (primary) hypertension 10/15/2013  . Gout 10/15/2013  . Degeneration macular 10/15/2013  . Combined fat and carbohydrate induced hyperlipemia 10/15/2013  . Arthritis, degenerative 10/15/2013  . Arthritis or polyarthritis, rheumatoid ([place]) 10/15/2013   PAST MEDICAL HISTORY:  Active Ambulatory Problems    Diagnosis Date Noted  . Anxiety and depression 10/15/2013  . Diabetes mellitus, type 2 ([place]) 08/02/2015  . Essential (primary) hypertension 10/15/2013  . Gout 10/15/2013  . ILD (interstitial lung disease) ([place]) 08/02/2015  . Degeneration macular 10/15/2013  . Headache, migraine 08/02/2015  . Combined fat and carbohydrate induced hyperlipemia 10/15/2013  . Arthritis, degenerative 10/15/2013  . Inflammation of a vein 08/02/2015  . Arthritis or polyarthritis, rheumatoid ([place]) 10/15/2013  .  Recurrent UTI 10/06/2015  . Vaginal atrophy 10/06/2015  . Cervical spondylosis with radiculopathy 04/04/2016  . Malignant neoplasm of upper-outer quadrant of right breast in female, estrogen receptor negative ([place]) 04/29/2017  . Pneumonia 08/04/2018   Resolved Ambulatory Problems    Diagnosis Date Noted  . No Resolved Ambulatory Problems   Past Medical History:  Diagnosis Date  . Anxiety   . Breast cancer ([place]) 2015  . Cough   . Depression   . Diabetes mellitus without  complication ([place])   . Dysrhythmia   . HLD (hyperlipidemia)   . HTN (hypertension)   . Hypertension   . Interstitial lung disease ([place])   . Lumbar spinal stenosis   . Macular degeneration   . Migraines   . Osteoarthritis   . Personal history of radiation therapy 2015  . Phlebitis   . Pulmonary nodule   . Rheumatoid arthritis ([place])    SOCIAL HX:  Social History   Tobacco Use  . Smoking status: Never Smoker  . Smokeless tobacco: Never Used  Substance Use Topics  . Alcohol use: No    Alcohol/week: 0.0 standard drinks   FAMILY HX:  Family History  Problem Relation Age of Onset  . Colon cancer Mother   . Stomach cancer Mother   . Esophageal cancer Mother   . COPD Father   . Colon polyps Sister   . Ovarian cancer Sister   . Kidney disease Neg Hx   . Bladder Cancer Neg Hx   . Kidney cancer Neg Hx   . Breast cancer Neg Hx     **  ALLERGIES:  Allergies  Allergen Reactions  . Lisinopril Cough  . Oxycodone-Acetaminophen Other (See Comments)    Caused AMS & hallucinations  . Penicillins Other (See Comments)    Makes pt very "forgetful" Did it involve swelling of the face/tongue/throat, SOB, or low BP? Unknown Did it involve sudden or severe rash/hives, skin peeling, or any reaction on the inside of your mouth or nose? Unknown Did you need to seek medical attention at a hospital or doctor's office? Unknown When did it last happen? If all above answers are "NO", may proceed with cephalosporin use.   . Clarithromycin Rash  . Sulfa Antibiotics Rash     PERTINENT MEDICATIONS:  Outpatient Encounter Medications as of 09/05/2020  Medication Sig  . acetaminophen (TYLENOL) 650 MG CR tablet Take 650 mg by mouth every 8 (eight) hours as needed for pain.  . Adalimumab (HUMIRA) 40 MG/0.8ML PSKT Inject 40 mg into the skin every 21 ( twenty-one) days.  Marland Kitchen alendronate (FOSAMAX) 70 MG tablet   . allopurinol (ZYLOPRIM) 300 MG tablet Take 300 mg by mouth every morning.   Marland Kitchen  amLODipine (NORVASC) 5 MG tablet Take 5 mg by mouth every morning.   . Ascorbic Acid (VITAMIN C) 1000 MG tablet Take 1,000 mg by mouth daily.   Marland Kitchen aspirin EC 325 MG tablet Take 325 mg by mouth daily.   . busPIRone (BUSPAR) 10 MG tablet Take 10 mg by mouth 2 (two) times daily.   . carvedilol (COREG) 6.25 MG tablet Take 6.25 mg by mouth 2 (two) times daily with a meal.  . Cranberry 400 MG CAPS Take 1 capsule by mouth 2 (two) times daily.  . folic acid (FOLVITE) 629 MCG tablet Take 400 mcg by mouth daily.  Marland Kitchen HYDROcodone-acetaminophen (NORCO) 5-325 MG tablet Take 1 tablet by mouth every 6 (six) hours as needed for moderate pain.  Marland Kitchen losartan (COZAAR) 100 MG  tablet Take 100 mg by mouth every morning.   . lovastatin (MEVACOR) 40 MG tablet Take 40 mg by mouth at bedtime.  . metFORMIN (GLUCOPHAGE) 1000 MG tablet Take 1,000 mg by mouth 2 (two) times daily with a meal.   . methotrexate 50 MG/2ML injection Inject 15 mg as directed once a week.  . Multiple Vitamin (MULTIVITAMIN WITH MINERALS) TABS tablet Take 1 tablet by mouth daily.  . Multiple Vitamins-Minerals (PRESERVISION/LUTEIN) CAPS Take 1 capsule by mouth daily.  Marland Kitchen PARoxetine (PAXIL) 30 MG tablet Take 30 mg by mouth every morning.   . Probiotic Product (PROBIOTIC DAILY) CAPS Take 1 capsule by mouth daily.  . traMADol (ULTRAM) 50 MG tablet Take 1 tablet (50 mg total) by mouth every 6 (six) hours as needed.   No facility-administered encounter medications on file as of 09/05/2020.    Objective: ROS  General: NAD ENMT: denies dysphagia Cardiovascular: denies chest pain Pulmonary: endorses high pitched cough, denies increased SOB, + some DOE Abdomen: endorses fair appetite, denies constipation, endorses continence of bowel GU: denies dysuria, endorses continence of urine MSK:  endorses ROM limitations,h/o RA , no falls reported Skin: denies rashes or wounds Neurological: endorses weakness, denies acute pain, denies insomnia Psych: Endorses  positive mood Heme/lymph/immuno: denies bruises, abnormal bleeding  Physical Exam: Current and past weights: recent recorded 135 lbs, 8% loss in 2 years Constitutional: HR 83,  RR 24, P02 92% General: frail appearing, thin EYES: anicteric sclera, lids intact, no discharge  ENMT: intact hearing,oral mucous membranes moist, dentition intact CV: S1S2, RRR, no LE edema Pulmonary: Wheezes and rales throughout all fields. + increased work of breathing, + wet cough, no audible wheezes, room air with oxygen prn. Abdomen: intake 50%,no ascites GU: deferred MSK: severe sarcopenia, decreased ROM in all extremities, no contractures of LE, ambulatory with rollator, fall risk Skin: warm and dry, no rashes or wounds on visible skin Neuro: Generalized weakness,moderate to advanced cognitive impairment Psych: non-anxious affect, A and O x 2 Hem/lymph/immuno: no widespread bruising   Thank you for the opportunity to participate in the care of Ms. Kendrix.  The palliative care team will continue to follow. Please call our office at [phone] if we can be of additional assistance.  Jason Coop, NP , DNP, MPH, AGPCNP-BC, ACHPN   COVID-19 PATIENT SCREENING TOOL  Person answering questions: _______daughter____________   1.  Is the patient or any family member in the home showing any signs or symptoms regarding respiratory infection?                  Person with Symptom  ______________na___________ a. Fever/chills/headache                                                        Yes___ No__X_            b. Shortness of breath                                                            Yes___ No__X_           c. Cough/congestion  Yes___  No__X_          d. Muscle/Body aches/pains                                                   Yes___ No__X_         e. Gastrointestinal symptoms (diarrhea,nausea)             Yes___ No__X_         f. Sudden loss of  smell or taste      Yes___ No__X_        2. Within the past 10 days, has anyone living in the home had any contact with someone with or under investigation for COVID-19?    Yes___ No__X__   Person __________________

## 2020-10-06 ENCOUNTER — Other Ambulatory Visit: Payer: Self-pay

## 2020-10-06 ENCOUNTER — Other Ambulatory Visit: Payer: Medicare Other | Admitting: Primary Care

## 2020-10-06 DIAGNOSIS — J849 Interstitial pulmonary disease, unspecified: Secondary | ICD-10-CM

## 2020-10-06 DIAGNOSIS — N390 Urinary tract infection, site not specified: Secondary | ICD-10-CM

## 2020-10-06 DIAGNOSIS — R300 Dysuria: Secondary | ICD-10-CM

## 2020-10-06 DIAGNOSIS — M0579 Rheumatoid arthritis with rheumatoid factor of multiple sites without organ or systems involvement: Secondary | ICD-10-CM

## 2020-10-06 NOTE — Progress Notes (Signed)
[facility] Consult Note Telephone: [phone]  Fax: [phone]    Date of encounter: 10/06/20 PATIENT NAME: [name] Mackenzie  [name] [id number]   [phone] (home)  DOB: [birth year] MRN: [id number] PRIMARY CARE PROVIDER:    Dion Body, MD,  [place] [address] [phone]  REFERRING PROVIDER:   Dion Body, MD [place] [facility] [place],  [place] [address] [phone]  RESPONSIBLE PARTY:    Contact Information    Name Relation Home Work [place] Daughter [phone]  [phone]   Colin [place] Daughter [phone]     [place] Granddaughter [phone]  [phone]      I met face to face with patient and family in home. Palliative Care was asked to follow this patient by consultation request of  Dion Body, MD to address advance care planning and complex medical decision making. This is the follow up visit.   ASSESSMENT AND RECOMMENDATIONS:   1. Advance Care Planning/Goals of Care: Goals include to maximize quality of life and symptom management. Our advance care planning conversation included a discussion about:     Reviewed from last visit, no changes   2. Symptom Management:   Dysuria: Patient has had dysuria x3 days. This has been associated with increased fatigue. No fever/chills/abd pain. Daughter says she previously had frequent UTIs which were typically treated with Macrobid. Will send Macrobid $RemoveBefor'100mg'zWRsXTnaUmMB$  q12 hrs for 5 days. Recommend d-mannose for prevention. Daughter to obtain. I asked daughter to obtain a urine hat for collection; if she has another infection, I would want c and s before prescribing.  Dyspnea: Dislikes the oxygen and tubing, does not use it often. She chooses to use it prn which I recommend for QOL goals. Discussed oxygen use being superior to inhalers for fibrotic  disease but patient feels use impacts her qol too much, and is a safety hazard.  Caregiver strain: Daughter is looking for support and respite. She has needed to cut back on weekly work hours due to her mothers care needs. Patient has anxiety around her daughter leaving her with other people. We discuss [facility] and [place] [facility]. Daughter to call for information. Also to call county DSS for [employer] she may qualify for, and for information on facility care.   Depression: Patient is struggling with ongoing depression. She is having a hard time with losing her independence and states she wants to go back home. She does discuss her own mortality and becomes tearful. Suggest starting Zoloft, patient and daughter are agreeable. Will start $RemoveBe'25mg'fcWWNUZAl$  for two weeks then titrate up to $Rem'50mg'uVfv$  as tolerated.  Disposition: Patient may be eligible for hospice services as she has elected to de escalate medical interventions. Goals of care are consistent, and we discussed the 6 month prognosis condition of enrollment.  Daughter describe  Patient as 6 months ago living at her home, but needing more help. She could go out to restaurants, ambulate by herself and enjoy the outing. She cannot do this level of function now, only able to ambulate in home and use walker.  3. Follow up Palliative Care Visit: Palliative care will continue to follow for advance care planning and clarification and symptom management. Return   3 weeks or prn.  4. Family /Caregiver/Community Supports: Patient lives with her daughter full time who provides all of her care.   5. Cognitive / Functional decline: Patients  mobility is good, but cognitively she continues to decline. She is struggling with depression and anxiety. Her mobility is limited by fatigue.  I spent 60 minutes providing this consultation. More than 50% of the time in this consultation was spent in counseling and care coordination.  CODE STATUS: DNR  PPS: 40%  HOSPICE  ELIGIBILITY/DIAGNOSIS: TBD  Medical reason for visit: Follow up breathing issues and dysuria   HISTORY OF PRESENT ILLNESS:  [name] is a 87 y.o. year old female  with with Interstitial lung disease, Dyspnea, DM, Degenerative arthritis, RA and dementia. She continues to have breathing difficulty due to her ILD, but she does not like wearing oxygen. She also reports dysuria for the last three days. She denies any fever/chills. Daughter states she has been more tired recently and she has been sleeping a lot. She has a history of anxiety and depression. She has tried many antidepressant medications and saw psychiatry, but she has not tolerated anything well. She has not tried zoloft yet per daughter.  History obtained from review of EMR, discussion with primary team, and  interview with family, caregiver  and/or [name]. Records reviewed and summarized above.  Review lab tests last were at the end of February, reviewed. Creat 0.68, EGFR WDL   CURRENT PROBLEM LIST:  Patient Active Problem List   Diagnosis Date Noted  . Cough 08/20/2020  . Asymptomatic bacteriuria 08/04/2020  . Debility 08/01/2020  . Dizziness 04/06/2020  . Pneumonia 08/04/2018  . Malignant neoplasm of upper-outer quadrant of right breast in female, estrogen receptor negative ([place]) 04/29/2017  . Cervical spondylosis with radiculopathy 04/04/2016  . Recurrent UTI 10/06/2015  . Vaginal atrophy 10/06/2015  . Diabetes mellitus, type 2 ([place]) 08/02/2015  . ILD (interstitial lung disease) ([place]) 08/02/2015  . Headache, migraine 08/02/2015  . Inflammation of a vein 08/02/2015  . Anxiety and depression 10/15/2013  . Essential (primary) hypertension 10/15/2013  . Gout 10/15/2013  . Degeneration macular 10/15/2013  . Combined fat and carbohydrate induced hyperlipemia 10/15/2013  . Arthritis, degenerative 10/15/2013  . Arthritis or polyarthritis, rheumatoid ([place]) 10/15/2013   PAST MEDICAL HISTORY:  Active Ambulatory  Problems    Diagnosis Date Noted  . Anxiety and depression 10/15/2013  . Diabetes mellitus, type 2 ([place]) 08/02/2015  . Essential (primary) hypertension 10/15/2013  . Gout 10/15/2013  . ILD (interstitial lung disease) ([place]) 08/02/2015  . Degeneration macular 10/15/2013  . Headache, migraine 08/02/2015  . Combined fat and carbohydrate induced hyperlipemia 10/15/2013  . Arthritis, degenerative 10/15/2013  . Inflammation of a vein 08/02/2015  . Arthritis or polyarthritis, rheumatoid ([place]) 10/15/2013  . Recurrent UTI 10/06/2015  . Vaginal atrophy 10/06/2015  . Cervical spondylosis with radiculopathy 04/04/2016  . Malignant neoplasm of upper-outer quadrant of right breast in female, estrogen receptor negative ([place]) 04/29/2017  . Pneumonia 08/04/2018  . Asymptomatic bacteriuria 08/04/2020  . Cough 08/20/2020  . Debility 08/01/2020  . Dizziness 04/06/2020   Resolved Ambulatory Problems    Diagnosis Date Noted  . No Resolved Ambulatory Problems   Past Medical History:  Diagnosis Date  . Anxiety   . Breast cancer ([place]) 2015  . Depression   . Diabetes mellitus without complication ([place])   . Dysrhythmia   . HLD (hyperlipidemia)   . HTN (hypertension)   . Hypertension   . Interstitial lung disease ([place])   . Lumbar spinal stenosis   . Macular degeneration   . Migraines   . Osteoarthritis   . Personal history of radiation therapy 2015  .  Phlebitis   . Pulmonary nodule   . Rheumatoid arthritis ([place])    SOCIAL HX:  Social History   Tobacco Use  . Smoking status: Never Smoker  . Smokeless tobacco: Never Used  Substance Use Topics  . Alcohol use: No    Alcohol/week: 0.0 standard drinks   FAMILY HX:  Family History  Problem Relation Age of Onset  . Colon cancer Mother   . Stomach cancer Mother   . Esophageal cancer Mother   . COPD Father   . Colon polyps Sister   . Ovarian cancer Sister   . Kidney disease Neg Hx   . Bladder Cancer Neg Hx   . Kidney cancer Neg Hx   .  Breast cancer Neg Hx       ALLERGIES:  Allergies  Allergen Reactions  . Lisinopril Cough  . Oxycodone-Acetaminophen Other (See Comments)    Caused AMS & hallucinations  . Penicillins Other (See Comments)    Makes pt very "forgetful" Did it involve swelling of the face/tongue/throat, SOB, or low BP? Unknown Did it involve sudden or severe rash/hives, skin peeling, or any reaction on the inside of your mouth or nose? Unknown Did you need to seek medical attention at a hospital or doctor's office? Unknown When did it last happen? If all above answers are "NO", may proceed with cephalosporin use.   . Clarithromycin Rash  . Sulfa Antibiotics Rash     PERTINENT MEDICATIONS:  Outpatient Encounter Medications as of 10/06/2020  Medication Sig  . acetaminophen (TYLENOL) 650 MG CR tablet Take 650 mg by mouth every 8 (eight) hours as needed for pain.  . Adalimumab 40 MG/0.8ML PSKT Inject 40 mg into the skin every 21 ( twenty-one) days.  Marland Kitchen alendronate (FOSAMAX) 70 MG tablet   . allopurinol (ZYLOPRIM) 300 MG tablet Take 300 mg by mouth every morning.   Marland Kitchen amLODipine (NORVASC) 5 MG tablet Take 5 mg by mouth every morning.   . Ascorbic Acid (VITAMIN C) 1000 MG tablet Take 1,000 mg by mouth daily.   Marland Kitchen aspirin EC 325 MG tablet Take 325 mg by mouth daily.   . busPIRone (BUSPAR) 10 MG tablet Take 10 mg by mouth 2 (two) times daily.  (Patient not taking: Reported on 09/05/2020)  . carvedilol (COREG) 6.25 MG tablet Take 6.25 mg by mouth 2 (two) times daily with a meal. (Patient not taking: Reported on 09/05/2020)  . Cranberry 400 MG CAPS Take 1 capsule by mouth 2 (two) times daily.  . folic acid (FOLVITE) 132 MCG tablet Take 400 mcg by mouth daily.  Marland Kitchen HYDROcodone-acetaminophen (NORCO) 5-325 MG tablet Take 1 tablet by mouth every 6 (six) hours as needed for moderate pain. (Patient not taking: Reported on 09/05/2020)  . losartan (COZAAR) 100 MG tablet Take 100 mg by mouth every morning.   . lovastatin  (MEVACOR) 40 MG tablet Take 40 mg by mouth at bedtime.  . melatonin 3 MG TABS tablet Take 6 mg by mouth at bedtime.  . metFORMIN (GLUCOPHAGE) 1000 MG tablet Take 1,000 mg by mouth 2 (two) times daily with a meal.   . methotrexate 50 MG/2ML injection Inject 15 mg as directed once a week.  . mirtazapine (REMERON) 7.5 MG tablet Take 7.5 mg by mouth at bedtime.  . Multiple Vitamin (MULTIVITAMIN WITH MINERALS) TABS tablet Take 1 tablet by mouth daily. (Patient not taking: Reported on 09/05/2020)  . Multiple Vitamins-Minerals (PRESERVISION/LUTEIN) CAPS Take 1 capsule by mouth daily. (Patient not taking: Reported on 09/05/2020)  .  PARoxetine (PAXIL) 30 MG tablet Take 30 mg by mouth every morning.  (Patient not taking: Reported on 09/05/2020)  . Probiotic Product (PROBIOTIC DAILY) CAPS Take 1 capsule by mouth daily.  . traMADol (ULTRAM) 50 MG tablet Take 1 tablet (50 mg total) by mouth every 6 (six) hours as needed. (Patient not taking: Reported on 09/05/2020)   No facility-administered encounter medications on file as of 10/06/2020.     ROS General: NAD ENMT: denies dysphagia Cardiovascular: denies chest pain, ongoing DOE stable Pulmonary: endorses deep and productive cough, ongoing SOB stable  Abdomen: endorses good appetite, denies constipation, endorses continence of bowel GU: endorses dysuria, endorses continence of urine MSK:  denies weakness,  no falls reported Skin: denies rashes or wounds Neurological: denies pain, denies insomnia, endorses sleeping more Psych: Endorses depressed mood  Physical Exam: Current and past weights: down approx 10 lbs since November per daughter, approx 137 lbs now Constitutional: NAD General: frail appearing EYES: anicteric sclera, lids intact, no discharge  ENMT: very hard of hearing, oral mucous membranes moist MSK: severe  sarcopenia, moves all extremities,  RA joint malformations,  ambulatory with walker and stand by Skin: warm and dry, no rashes or wounds on  visible skin Neuro:  + generalized weakness,  Mild cognitive impairment Psych: anxious affect, A and O x 2  Thank you for the opportunity to participate in the care of Ms. Warburton.  The palliative care team will continue to follow. Please call our office at [phone] if we can be of additional assistance.   Jason Coop, NP , DNP, MPH, AGPCNP-BC, ACHPN    COVID-19 PATIENT SCREENING TOOL Asked and negative response unless otherwise noted:   Have you had symptoms of covid, tested positive or been in contact with someone with symptoms/positive test in the past 5-10 days?

## 2020-10-07 ENCOUNTER — Telehealth: Payer: Self-pay | Admitting: Primary Care

## 2020-10-07 ENCOUNTER — Telehealth: Payer: Self-pay

## 2020-10-07 NOTE — Telephone Encounter (Signed)
(  3:32p) Palliative care SW completed a follow-up call to patient's daughter-Amy to provide resources and support to her, per request of NP-K. Smith. SW received her voicemail and left a detailed message extending support and requested a call back to this SW or SW-A. Henrene Pastor.   *NP updated for follow-up.

## 2020-10-07 NOTE — Telephone Encounter (Signed)
Discussion with daughter RE hospice program. Discussed her goals with de escalating some of the medications pt is on. Also requested PO2 if she's able to assess. Will do TM next week to assess and discuss ongoing medical management.

## 2020-10-07 NOTE — Telephone Encounter (Signed)
T/c to Dr. Lindon Romp to discuss medication management. Feels patient may have flare up if she is off humira, which is now given monthly per daughter. States she  would need to continue Methotrexate. Will discuss with daughter.

## 2020-10-10 ENCOUNTER — Other Ambulatory Visit: Payer: Medicare Other | Admitting: Primary Care

## 2020-10-10 ENCOUNTER — Other Ambulatory Visit: Payer: Self-pay

## 2020-10-10 DIAGNOSIS — F419 Anxiety disorder, unspecified: Secondary | ICD-10-CM

## 2020-10-10 DIAGNOSIS — Z515 Encounter for palliative care: Secondary | ICD-10-CM

## 2020-10-10 DIAGNOSIS — J849 Interstitial pulmonary disease, unspecified: Secondary | ICD-10-CM

## 2020-10-10 NOTE — Progress Notes (Signed)
[facility] Consult Note Telephone: [phone]  Fax: [phone]   Due to the COVID-19 crisis, this visit was done via telemedicine from my office and it was initiated and consent by this patient and or family.  I connected with  [name] on 10/10/20 by a video enabled telemedicine application and verified that I am speaking with the correct person using two identifiers. The patient expressed understanding and agreed to proceed.  Date of encounter: 10/10/20 PATIENT NAME: [name] [name]  [name] [id number]   [phone] (home)  DOB: 11/10/1931 MRN: [id number] PRIMARY CARE PROVIDER:    Dion Body, MD,  [name] [name] [address] [phone]  REFERRING PROVIDER:   Dion [name], [place] [name] [name],  [name] [phone]  RESPONSIBLE PARTY:    Contact Information    Name Relation Home Work [place] Daughter [phone]  [phone]   [name] Daughter [phone]     [place],[name] Granddaughter [phone]  [phone]       Palliative Care was asked to follow this patient by consultation request of Mackenzie Body, MD to address advance care planning.                                   ASSESSMENT AND PLAN / RECOMMENDATIONS:   Advance Care Planning/Goals of Care: Goals include to maximize quality of life and symptom management. Our advance care planning conversation included a discussion about:      Exploration of personal, cultural or spiritual beliefs that might influence medical decisions   Exploration of goals of care in the event of a sudden injury or illness   Identification  of a healthcare agent - Daughter making decisions, granddaughter also attempting to help with care.  Review of an advance directive document  Decision to de-escalate disease focused treatments due  to poor prognosis- Request hospice admission.  CODE STATUS: DNR  Family called for telemed to discuss hospice services. I relayed my discussion with Doctor Yvetta Coder from last week whereby he did suggest she stay on methotrexate. He recommended  Humira  only if the benefit outweighed Hospice needs, as it would not be covered  on the Hospice formulary  plan. Daughter states that her mother is not eating and not taking any medicines. She states she is resistant to care and that she really needs help as she deteriorates. Daughter states that the patient is sleeping more and interactions are more agitated. She endorses needing a primary care provider who can come to their home, as her goals include not returning to office visits. She wants to stop Humira, and she requests that Hospice services began  ASAP, so I have submitted a referral to [place] [facility] request.  Follow up Palliative Care Visit: Only if hospice admission not made. Handing off to hospice referral center. They will reach out to PCP  for certification and orders.  I spent 20 minutes providing this consultation. More than 50% of the time in this consultation was spent in counseling and care coordination.  Thank you for the opportunity to participate in the care of [name].  The palliative care team will continue to follow. Please call our office at [phone] if we can be of additional assistance.   Jason Coop, NP , DNP, MPH,  AGPCNP-BC, ACHPN

## 2020-10-27 ENCOUNTER — Other Ambulatory Visit: Payer: Medicare Other | Admitting: Primary Care

## 2020-11-27 ENCOUNTER — Emergency Department

## 2020-11-27 ENCOUNTER — Inpatient Hospital Stay
Admission: EM | Admit: 2020-11-27 | Discharge: 2020-12-03 | DRG: 177 | Disposition: A | Discharge status: Hospice | Attending: Internal Medicine | Admitting: Internal Medicine

## 2020-11-27 ENCOUNTER — Other Ambulatory Visit: Payer: Self-pay

## 2020-11-27 DIAGNOSIS — F419 Anxiety disorder, unspecified: Secondary | ICD-10-CM | POA: Diagnosis present

## 2020-11-27 DIAGNOSIS — A0839 Other viral enteritis: Secondary | ICD-10-CM | POA: Diagnosis present

## 2020-11-27 DIAGNOSIS — J849 Interstitial pulmonary disease, unspecified: Secondary | ICD-10-CM | POA: Diagnosis present

## 2020-11-27 DIAGNOSIS — Z825 Family history of asthma and other chronic lower respiratory diseases: Secondary | ICD-10-CM

## 2020-11-27 DIAGNOSIS — Z800 Family history of malignant neoplasm of digestive organs: Secondary | ICD-10-CM

## 2020-11-27 DIAGNOSIS — Z79899 Other long term (current) drug therapy: Secondary | ICD-10-CM

## 2020-11-27 DIAGNOSIS — M1990 Unspecified osteoarthritis, unspecified site: Secondary | ICD-10-CM | POA: Diagnosis present

## 2020-11-27 DIAGNOSIS — Z853 Personal history of malignant neoplasm of breast: Secondary | ICD-10-CM

## 2020-11-27 DIAGNOSIS — F32A Depression, unspecified: Secondary | ICD-10-CM | POA: Diagnosis present

## 2020-11-27 DIAGNOSIS — R197 Diarrhea, unspecified: Secondary | ICD-10-CM

## 2020-11-27 DIAGNOSIS — H9190 Unspecified hearing loss, unspecified ear: Secondary | ICD-10-CM | POA: Diagnosis present

## 2020-11-27 DIAGNOSIS — Z981 Arthrodesis status: Secondary | ICD-10-CM

## 2020-11-27 DIAGNOSIS — I10 Essential (primary) hypertension: Secondary | ICD-10-CM | POA: Diagnosis present

## 2020-11-27 DIAGNOSIS — Z881 Allergy status to other antibiotic agents status: Secondary | ICD-10-CM

## 2020-11-27 DIAGNOSIS — U071 COVID-19: Principal | ICD-10-CM | POA: Diagnosis present

## 2020-11-27 DIAGNOSIS — M109 Gout, unspecified: Secondary | ICD-10-CM | POA: Diagnosis present

## 2020-11-27 DIAGNOSIS — Z66 Do not resuscitate: Secondary | ICD-10-CM | POA: Diagnosis present

## 2020-11-27 DIAGNOSIS — G9341 Metabolic encephalopathy: Secondary | ICD-10-CM | POA: Diagnosis present

## 2020-11-27 DIAGNOSIS — R55 Syncope and collapse: Secondary | ICD-10-CM

## 2020-11-27 DIAGNOSIS — Z923 Personal history of irradiation: Secondary | ICD-10-CM

## 2020-11-27 DIAGNOSIS — E785 Hyperlipidemia, unspecified: Secondary | ICD-10-CM | POA: Diagnosis present

## 2020-11-27 DIAGNOSIS — Z885 Allergy status to narcotic agent status: Secondary | ICD-10-CM

## 2020-11-27 DIAGNOSIS — D6959 Other secondary thrombocytopenia: Secondary | ICD-10-CM | POA: Diagnosis not present

## 2020-11-27 DIAGNOSIS — Z7984 Long term (current) use of oral hypoglycemic drugs: Secondary | ICD-10-CM

## 2020-11-27 DIAGNOSIS — Z8041 Family history of malignant neoplasm of ovary: Secondary | ICD-10-CM

## 2020-11-27 DIAGNOSIS — Z7982 Long term (current) use of aspirin: Secondary | ICD-10-CM

## 2020-11-27 DIAGNOSIS — Z7983 Long term (current) use of bisphosphonates: Secondary | ICD-10-CM

## 2020-11-27 DIAGNOSIS — Z882 Allergy status to sulfonamides status: Secondary | ICD-10-CM

## 2020-11-27 DIAGNOSIS — F0150 Vascular dementia without behavioral disturbance: Secondary | ICD-10-CM | POA: Diagnosis present

## 2020-11-27 DIAGNOSIS — Z880 Allergy status to penicillin: Secondary | ICD-10-CM

## 2020-11-27 DIAGNOSIS — D696 Thrombocytopenia, unspecified: Secondary | ICD-10-CM

## 2020-11-27 DIAGNOSIS — D709 Neutropenia, unspecified: Secondary | ICD-10-CM

## 2020-11-27 DIAGNOSIS — E119 Type 2 diabetes mellitus without complications: Secondary | ICD-10-CM

## 2020-11-27 DIAGNOSIS — M069 Rheumatoid arthritis, unspecified: Secondary | ICD-10-CM | POA: Diagnosis present

## 2020-11-27 DIAGNOSIS — Z8371 Family history of colonic polyps: Secondary | ICD-10-CM

## 2020-11-27 DIAGNOSIS — E860 Dehydration: Secondary | ICD-10-CM | POA: Diagnosis present

## 2020-11-27 DIAGNOSIS — Z888 Allergy status to other drugs, medicaments and biological substances status: Secondary | ICD-10-CM

## 2020-11-27 LAB — COMPREHENSIVE METABOLIC PANEL
ALT: 38 U/L (ref 0–44)
AST: 79 U/L — ABNORMAL HIGH (ref 15–41)
Albumin: 3.4 g/dL — ABNORMAL LOW (ref 3.5–5.0)
Alkaline Phosphatase: 55 U/L (ref 38–126)
Anion gap: 13 (ref 5–15)
BUN: 27 mg/dL — ABNORMAL HIGH (ref 8–23)
CO2: 22 mmol/L (ref 22–32)
Calcium: 9.5 mg/dL (ref 8.9–10.3)
Chloride: 97 mmol/L — ABNORMAL LOW (ref 98–111)
Creatinine, Ser: 0.98 mg/dL (ref 0.44–1.00)
GFR, Estimated: 56 mL/min — ABNORMAL LOW (ref >60)
Glucose, Bld: 82 mg/dL (ref 70–99)
Potassium: 4 mmol/L (ref 3.5–5.1)
Sodium: 132 mmol/L — ABNORMAL LOW (ref 135–145)
Total Bilirubin: 0.8 mg/dL (ref 0.3–1.2)
Total Protein: 8.3 g/dL — ABNORMAL HIGH (ref 6.5–8.1)

## 2020-11-27 LAB — CBC
HCT: 40.9 % (ref 36.0–46.0)
Hemoglobin: 13.6 g/dL (ref 12.0–15.0)
MCH: 28.6 pg (ref 26.0–34.0)
MCHC: 33.3 g/dL (ref 30.0–36.0)
MCV: 85.9 fL (ref 80.0–100.0)
Platelets: 133 10*3/uL — ABNORMAL LOW (ref 150–400)
RBC: 4.76 MIL/uL (ref 3.87–5.11)
RDW: 15.3 % (ref 11.5–15.5)
WBC: 3.7 10*3/uL — ABNORMAL LOW (ref 4.0–10.5)
nRBC: 0 % (ref 0.0–0.2)

## 2020-11-27 LAB — URINALYSIS, COMPLETE (UACMP) WITH MICROSCOPIC
Bilirubin Urine: NEGATIVE
Glucose, UA: NEGATIVE mg/dL
Hgb urine dipstick: NEGATIVE
Ketones, ur: 5 mg/dL — AB
Leukocytes,Ua: NEGATIVE
Nitrite: NEGATIVE
Protein, ur: NEGATIVE mg/dL
Specific Gravity, Urine: 1.023 (ref 1.005–1.030)
pH: 5 (ref 5.0–8.0)

## 2020-11-27 LAB — LIPASE, BLOOD: Lipase: 93 U/L — ABNORMAL HIGH (ref 11–51)

## 2020-11-27 LAB — RESP PANEL BY RT-PCR (FLU A&B, COVID) ARPGX2
Influenza A by PCR: NEGATIVE
Influenza B by PCR: NEGATIVE
SARS Coronavirus 2 by RT PCR: POSITIVE — AB

## 2020-11-27 MED ORDER — SODIUM CHLORIDE 0.9 % IV BOLUS
1000.0000 mL | Freq: Once | INTRAVENOUS | Status: AC
  Administered 2020-11-27: 1000 mL via INTRAVENOUS

## 2020-11-27 NOTE — ED Provider Notes (Signed)
[facility] Emergency Department Provider Note  Time seen: 8:12 PM  I have reviewed the triage vital signs and the nursing notes.   HISTORY  Chief Complaint Diarrhea and Loss of Consciousness   HPI [name] is a 87 y.o. female with a past medical history of anxiety, diabetes, hypertension, hyperlipidemia, on hospice care at home presents to the emergency department for 2 syncopal episodes diarrhea and COVID-positive status.  According to the patient reports she tested positive for COVID this week she has been experiencing frequent episodes of diarrhea and occasional episodes of vomiting.  Patient states she had 2 syncopal episodes today.  Currently patient appears well she denies any pain.  Does admit to feeling very weak.   Past Medical History:  Diagnosis Date  . Anxiety   . Breast cancer ([place]) 2015   right- radiation  . Cough   . Depression   . Diabetes mellitus without complication ([place])   . Dysrhythmia   . Gout   . HLD (hyperlipidemia)   . HTN (hypertension)   . Hypertension   . Interstitial lung disease ([place])   . Lumbar spinal stenosis   . Macular degeneration   . Migraines   . Osteoarthritis   . Personal history of radiation therapy 2015   RIGHT lumpectomy w/ radiation 2015  . Phlebitis   . Pneumonia 08/2018  . Pulmonary nodule   . Rheumatoid arthritis [facility])     Patient Active Problem List   Diagnosis Date Noted  . Cough 08/20/2020  . Asymptomatic bacteriuria 08/04/2020  . Debility 08/01/2020  . Dizziness 04/06/2020  . Pneumonia 08/04/2018  . Malignant neoplasm of upper-outer quadrant of right breast in female, estrogen receptor negative ([place]) 04/29/2017  . Cervical spondylosis with radiculopathy 04/04/2016  . Recurrent UTI 10/06/2015  . Vaginal atrophy 10/06/2015  . Diabetes mellitus, type 2 ([place]) 08/02/2015  . ILD (interstitial lung disease) ([place]) 08/02/2015  . Headache, migraine 08/02/2015  . Inflammation of a vein  08/02/2015  . Anxiety and depression 10/15/2013  . Essential (primary) hypertension 10/15/2013  . Gout 10/15/2013  . Degeneration macular 10/15/2013  . Combined fat and carbohydrate induced hyperlipemia 10/15/2013  . Arthritis, degenerative 10/15/2013  . Arthritis or polyarthritis, rheumatoid ([place]) 10/15/2013    Past Surgical History:  Procedure Laterality Date  . ABDOMINAL HYSTERECTOMY    . ANTERIOR CERVICAL DECOMP/DISCECTOMY FUSION N/A 04/04/2016   Procedure: ANTERIOR CERVICAL DECOMPRESSION/DISCECTOMY FUSION, INTERBODY PROSTHESIS,PLATE CERVICAL  FIVE-SIX,CERVICAL SIX-SEVEN,CERVICAL SEVEN-THORACIC ONE;  Surgeon: Newman Pies, MD;  Location: [place];  Service: Neurosurgery;  Laterality: N/A;  . BREAST BIOPSY Right 02/21/2015   CALCIFICATION INVOLVING HYALINIZED STROMA AND BENIGN DUCTS  . BREAST BIOPSY Right 03/29/2016   FAT NECROSIS WITH CALCIFICATIONS.   Marland Kitchen BREAST BIOPSY Right 2015   + invasive mam ca  . BREAST BIOPSY Right 11/30/2016   BENIGN BREAST TISSUE WITH ORGANIZING FAT NECROSIS AND CHANGES   . COLONOSCOPY    . ESOPHAGOGASTRODUODENOSCOPY    . KYPHOPLASTY N/A 01/08/2019   Procedure: T-12, L1 KYPHOPLASTY;  Surgeon: Hessie Knows, MD;  Location: [facility];  Service: Orthopedics;  Laterality: N/A;  . LYMPH NODE BIOPSY Left   . MASTECTOMY, PARTIAL Right     Prior to Admission medications   Medication Sig Start Date End Date Taking? Authorizing Provider  acetaminophen (TYLENOL) 650 MG CR tablet Take 650 mg by mouth every 8 (eight) hours as needed for pain.    [provider]  Adalimumab 40 MG/0.8ML PSKT Inject 40 mg into the  skin every 21 ( twenty-one) days.    [provider]  alendronate (FOSAMAX) 70 MG tablet  03/24/19   [provider]  allopurinol (ZYLOPRIM) 300 MG tablet Take 300 mg by mouth every morning.     [provider]  amLODipine (NORVASC) 5 MG tablet Take 5 mg by mouth every morning.  08/12/17 09/05/20  [provider]   Ascorbic Acid (VITAMIN C) 1000 MG tablet Take 1,000 mg by mouth daily.     [provider]  aspirin EC 325 MG tablet Take 325 mg by mouth daily.     [provider]  busPIRone (BUSPAR) 10 MG tablet Take 10 mg by mouth 2 (two) times daily.  Patient not taking: Reported on 09/05/2020 09/03/16   [provider]  carvedilol (COREG) 6.25 MG tablet Take 6.25 mg by mouth 2 (two) times daily with a meal. Patient not taking: Reported on 09/05/2020    [provider]  Cranberry 400 MG CAPS Take 1 capsule by mouth 2 (two) times daily.    [provider]  folic acid (FOLVITE) 546 MCG tablet Take 400 mcg by mouth daily.    [provider]  HYDROcodone-acetaminophen (NORCO) 5-325 MG tablet Take 1 tablet by mouth every 6 (six) hours as needed for moderate pain. Patient not taking: Reported on 09/05/2020 01/08/19   Hessie Knows, MD  losartan (COZAAR) 100 MG tablet Take 100 mg by mouth every morning.     [provider]  lovastatin (MEVACOR) 40 MG tablet Take 40 mg by mouth at bedtime.    [provider]  melatonin 3 MG TABS tablet Take 6 mg by mouth at bedtime.    [provider]  metFORMIN (GLUCOPHAGE) 1000 MG tablet Take 1,000 mg by mouth 2 (two) times daily with a meal.     [provider]  methotrexate 50 MG/2ML injection Inject 15 mg as directed once a week. 04/07/18   [provider]  mirtazapine (REMERON) 7.5 MG tablet Take 7.5 mg by mouth at bedtime.    [provider]  Multiple Vitamin (MULTIVITAMIN WITH MINERALS) TABS tablet Take 1 tablet by mouth daily. Patient not taking: Reported on 09/05/2020    [provider]  Multiple Vitamins-Minerals (PRESERVISION/LUTEIN) CAPS Take 1 capsule by mouth daily. Patient not taking: Reported on 09/05/2020    [provider]  nitrofurantoin, macrocrystal-monohydrate, (MACROBID) 100 MG capsule Take 100 mg by mouth 2 (two) times daily.    [provider]  PARoxetine (PAXIL) 30 MG tablet Take 30 mg by mouth every morning.  Patient not taking: Reported on 09/05/2020    [provider]  Probiotic Product (PROBIOTIC DAILY) CAPS Take 1 capsule by mouth daily.    [provider]  sertraline (ZOLOFT) 50 MG tablet Take 50 mg by mouth daily. Take 1/2 tablet by mouth daily x 2 weeks, then one tablet daily.    [provider]  traMADol (ULTRAM) 50 MG tablet Take 1 tablet (50 mg total) by mouth every 6 (six) hours as needed. Patient not taking: Reported on 09/05/2020 12/28/18   Carrie Mew, MD    Allergies  Allergen Reactions  . Lisinopril Cough  . Oxycodone-Acetaminophen Other (See Comments)    Caused AMS & hallucinations  . Penicillins Other (See Comments)    Makes pt very "forgetful" Did it involve swelling of the face/tongue/throat, SOB, or low BP? Unknown Did it involve sudden or severe rash/hives, skin peeling, or any reaction on the inside of  your mouth or nose? Unknown Did you need to seek medical attention at a hospital or doctor's office? Unknown When did it last happen? If all above answers are "NO", may proceed with cephalosporin use.   . Clarithromycin Rash  . Sulfa Antibiotics Rash    Family History  Problem Relation Age of Onset  . Colon cancer Mother   . Stomach cancer Mother   . Esophageal cancer Mother   . COPD Father   . Colon polyps Sister   . Ovarian cancer Sister   . Kidney disease Neg Hx   . Bladder Cancer Neg Hx   . Kidney cancer Neg Hx   . Breast cancer Neg Hx     Social History Social History   Tobacco Use  . Smoking status: Never Smoker  . Smokeless tobacco: Never Used  Vaping Use  . Vaping Use: Never used  Substance Use Topics  . Alcohol use: No    Alcohol/week: 0.0 standard drinks  . Drug use: No    Review of Systems Constitutional: Negative for fever.  Positive generalized fatigue and weakness. Cardiovascular: Negative for chest  pain. Respiratory: Negative for shortness of breath. Gastrointestinal: Negative for abdominal pain.  Intermittent vomiting, frequent diarrhea per patient. Genitourinary: Negative for urinary compaints Musculoskeletal: Negative for musculoskeletal complaints Neurological: Negative for headache All other ROS negative  ____________________________________________   PHYSICAL EXAM:  VITAL SIGNS: ED Triage Vitals  Enc Vitals Group     BP 11/27/20 2010 126/70     Pulse Rate 11/27/20 2010 72     Resp 11/27/20 2010 18     Temp 11/27/20 2010 97.9 F (36.6 C)     Temp Source 11/27/20 2010 Oral     SpO2 11/27/20 2010 98 %     Weight 11/27/20 2011 143 lb 4.8 oz (65 kg)     Height 11/27/20 2011 5\' 1"  (1.549 m)     Head Circumference --      Peak Flow --      Pain Score 11/27/20 2011 0     Pain Loc --      Pain Edu? --      Excl. in [place]? --     Constitutional: Alert and oriented. Well appearing and in no distress. Eyes: Normal exam ENT      Head: Normocephalic and atraumatic.      Mouth/Throat: Mucous membranes are moist. Cardiovascular: Normal rate, regular rhythm.  Respiratory: Normal respiratory effort without tachypnea nor retractions. Breath sounds are clear  Gastrointestinal: Soft and nontender. No distention.   Musculoskeletal: Nontender with normal range of motion in all extremities. Neurologic:  Normal speech and language. No gross focal neurologic deficits Skin:  Skin is warm, dry and intact.  Psychiatric: Mood and affect are normal.   ____________________________________________    EKG  EKG viewed and interpreted by myself shows a sinus rhythm at 71 bpm with a widened QRS, left axis deviation, otherwise largely normal intervals nonspecific ST changes.  Do not agree with acute MI reading.  ____________________________________________    RADIOLOGY  Chest x-ray shows patchy bilateral airspace opacities.  ____________________________________________   INITIAL  IMPRESSION / ASSESSMENT AND PLAN / ED COURSE  Pertinent labs & imaging results that were available during my care of the patient were reviewed by me and considered in my medical decision making (see chart for details).   Patient presents emergency department for syncope x2 today as well as diarrhea and generalized weakness per report tested positive for COVID this week.  We will check labs, including a COVID PCR we will obtain a chest x-ray IV hydrate and continue to closely monitor.  Patient is on hospice care has a DNR with her.  However she is awake alert seems to be able to make her own medical decisions and gives a good history.  Given the weakness and syncope I believe the patient would still benefit from a short hospital admission.  Patient agrees to this plan.  Patient's results are positive for COVID, patchy bilateral opacities on x-ray.  Chemistry shows signs of mild dehydration otherwise largely nonrevealing.  Given the patient's weakness diarrhea nausea vomiting 2 episodes of syncope today COVID-positive we will admit to the hospitalist service for continued treatment.  Although the patient is on hospice care at home given her acute decline I believe she would benefit from admission to the hospital for hydration.  Patient is agreeable to this plan of care.  [place] was evaluated in Emergency Department on 11/27/2020 for the symptoms described in the history of present illness. She was evaluated in the context of the global COVID-19 pandemic, which necessitated consideration that the patient might be at risk for infection with the SARS-CoV-2 virus that causes COVID-19. Institutional protocols and algorithms that pertain to the evaluation of patients at risk for COVID-19 are in a state of rapid change based on information released by regulatory bodies including the CDC and federal and state organizations. These policies and algorithms were followed during the patient's care in the  ED.  ____________________________________________   FINAL CLINICAL IMPRESSION(S) / ED DIAGNOSES  COVID Syncope Dehydration Weakness   Harvest Dark, MD 11/27/20 2341

## 2020-11-27 NOTE — H&P (Signed)
History and Physical    [name] KDT:[id number] DOB: [birth year] DOA: 11/27/2020  PCP: Mackenzie Body, MD  Patient coming from: Home via EMS  I have personally briefly reviewed patient's old medical records in [place]  Chief Complaint: N/D, syncope  HPI: [name] is a 87 y.o. female with medical history significant for interstitial lung disease, rheumatoid arthritis, T2DM, HTN, HLD, depression/anxiety who is currently active with hospice and presents to the ED for evaluation of nausea, diarrhea, and syncope at home.  History limited from patient due to hard of hearing and encephalopathy and is otherwise obtained from EDP, chart review, and daughter by phone.  Patient is currently active with hospice.  She lives at home with family.  She requires assistance with ADLs and at baseline is usually coherent and communicative.  Daughter states that family in the house got COVID and then patient tested positive on 5/27.  Since then she has been having significant generalized weakness, fatigue, diaphoresis, chills, nausea, and today new diarrhea.  Family was attempting to get her up to the bedside commode and she had a syncopal episode.  She did not fall or injure herself.  EMS were called and patient was brought to the ED for further evaluation.  ED Course:  Initial vitals show BP 126/70, pulse 72, RR 18, temp 97.9 F, SPO2 98% on 2 L supplemental O2 via [place].  Labs show sodium 132, potassium 4.0, bicarb 22, BUN 27, creatinine 0.98, serum glucose 82, AST 79, ALT 38, alk phos 55, total bilirubin 0.8, WBC 3.7, hemoglobin 13.6, platelets 133,000, lipase 93.  Urinalysis negative for UTI.  SARS-CoV-2 PCR is positive.  Influenza A/B-.  Portable chest x-ray shows mild patchy airspace opacities.  Patient was given 1 L normal saline.  The hospitalist service was consulted to admit for further evaluation and management.  Review of Systems: All systems reviewed and are negative except  as documented in history of present illness above.   Past Medical History:  Diagnosis Date  . Anxiety   . Breast cancer ([place]) 2015   right- radiation  . Cough   . Depression   . Diabetes mellitus without complication ([place])   . Dysrhythmia   . Gout   . HLD (hyperlipidemia)   . HTN (hypertension)   . Hypertension   . Interstitial lung disease ([name])   . Lumbar spinal stenosis   . Macular degeneration   . Migraines   . Osteoarthritis   . Personal history of radiation therapy 2015   RIGHT lumpectomy w/ radiation 2015  . Phlebitis   . Pneumonia 08/2018  . Pulmonary nodule   . Rheumatoid arthritis [facility])     Past Surgical History:  Procedure Laterality Date  . ABDOMINAL HYSTERECTOMY    . ANTERIOR CERVICAL DECOMP/DISCECTOMY FUSION N/A 04/04/2016   Procedure: ANTERIOR CERVICAL DECOMPRESSION/DISCECTOMY FUSION, INTERBODY PROSTHESIS,PLATE CERVICAL  FIVE-SIX,CERVICAL SIX-SEVEN,CERVICAL SEVEN-THORACIC ONE;  Surgeon: Newman Pies, MD;  Location: [place];  Service: Neurosurgery;  Laterality: N/A;  . BREAST BIOPSY Right 02/21/2015   CALCIFICATION INVOLVING HYALINIZED STROMA AND BENIGN DUCTS  . BREAST BIOPSY Right 03/29/2016   FAT NECROSIS WITH CALCIFICATIONS.   Marland Kitchen BREAST BIOPSY Right 2015   + invasive mam ca  . BREAST BIOPSY Right 11/30/2016   BENIGN BREAST TISSUE WITH ORGANIZING FAT NECROSIS AND CHANGES   . COLONOSCOPY    . ESOPHAGOGASTRODUODENOSCOPY    . KYPHOPLASTY N/A 01/08/2019   Procedure: T-12, L1 KYPHOPLASTY;  Surgeon: Hessie Knows, MD;  Location: [facility];  Service: Orthopedics;  Laterality: N/A;  . LYMPH NODE BIOPSY Left   . MASTECTOMY, PARTIAL Right     Social History:  reports that she has never smoked. She has never used smokeless tobacco. She reports that she does not drink alcohol and does not use drugs.  Allergies  Allergen Reactions  . Lisinopril Cough  . Oxycodone-Acetaminophen Other (See Comments)    Caused AMS & hallucinations  . Penicillins Other (See  Comments)    Makes pt very "forgetful" Did it involve swelling of the face/tongue/throat, SOB, or low BP? Unknown Did it involve sudden or severe rash/hives, skin peeling, or any reaction on the inside of your mouth or nose? Unknown Did you need to seek medical attention at a hospital or doctor's office? Unknown When did it last happen? If all above answers are "NO", may proceed with cephalosporin use.   . Clarithromycin Rash  . Sulfa Antibiotics Rash    Family History  Problem Relation Age of Onset  . Colon cancer Mother   . Stomach cancer Mother   . Esophageal cancer Mother   . COPD Father   . Colon polyps Sister   . Ovarian cancer Sister   . Kidney disease Neg Hx   . Bladder Cancer Neg Hx   . Kidney cancer Neg Hx   . Breast cancer Neg Hx      Prior to Admission medications   Medication Sig Start Date End Date Taking? Authorizing Provider  acetaminophen (TYLENOL) 650 MG CR tablet Take 650 mg by mouth every 8 (eight) hours as needed for pain.    [provider]  Adalimumab 40 MG/0.8ML PSKT Inject 40 mg into the skin every 21 ( twenty-one) days.    [provider]  alendronate (FOSAMAX) 70 MG tablet  03/24/19   [provider]  allopurinol (ZYLOPRIM) 300 MG tablet Take 300 mg by mouth every morning.     [provider]  amLODipine (NORVASC) 5 MG tablet Take 5 mg by mouth every morning.  08/12/17 09/05/20  [provider]  Ascorbic Acid (VITAMIN C) 1000 MG tablet Take 1,000 mg by mouth daily.     [provider]  aspirin EC 325 MG tablet Take 325 mg by mouth daily.     [provider]  busPIRone (BUSPAR) 10 MG tablet Take 10 mg by mouth 2 (two) times daily.  Patient not taking: Reported on 09/05/2020 09/03/16   [provider]  carvedilol (COREG) 6.25 MG tablet Take 6.25 mg by mouth 2 (two) times daily with a meal. Patient not taking: Reported on 09/05/2020    [provider]  Cranberry 400 MG CAPS  Take 1 capsule by mouth 2 (two) times daily.    [provider]  folic acid (FOLVITE) 641 MCG tablet Take 400 mcg by mouth daily.    [provider]  HYDROcodone-acetaminophen (NORCO) 5-325 MG tablet Take 1 tablet by mouth every 6 (six) hours as needed for moderate pain. Patient not taking: Reported on 09/05/2020 01/08/19   Hessie Knows, MD  losartan (COZAAR) 100 MG tablet Take 100 mg by mouth every morning.     [provider]  lovastatin (MEVACOR) 40 MG tablet Take 40 mg by mouth at bedtime.    [provider]  melatonin 3 MG TABS tablet Take 6 mg by mouth at bedtime.    [provider]  metFORMIN (GLUCOPHAGE) 1000 MG tablet Take 1,000 mg by mouth 2 (two) times daily with a meal.  [provider]  methotrexate 50 MG/2ML injection Inject 15 mg as directed once a week. 04/07/18   [provider]  mirtazapine (REMERON) 7.5 MG tablet Take 7.5 mg by mouth at bedtime.    [provider]  Multiple Vitamin (MULTIVITAMIN WITH MINERALS) TABS tablet Take 1 tablet by mouth daily. Patient not taking: Reported on 09/05/2020    [provider]  Multiple Vitamins-Minerals (PRESERVISION/LUTEIN) CAPS Take 1 capsule by mouth daily. Patient not taking: Reported on 09/05/2020    [provider]  nitrofurantoin, macrocrystal-monohydrate, (MACROBID) 100 MG capsule Take 100 mg by mouth 2 (two) times daily.    [provider]  PARoxetine (PAXIL) 30 MG tablet Take 30 mg by mouth every morning.  Patient not taking: Reported on 09/05/2020    [provider]  Probiotic Product (PROBIOTIC DAILY) CAPS Take 1 capsule by mouth daily.    [provider]  sertraline (ZOLOFT) 50 MG tablet Take 50 mg by mouth daily. Take 1/2 tablet by mouth daily x 2 weeks, then one tablet daily.    [provider]  traMADol (ULTRAM) 50 MG tablet Take 1 tablet (50 mg total) by mouth every 6 (six) hours as needed. Patient not  taking: Reported on 09/05/2020 12/28/18   Carrie Mew, MD    Physical Exam: Vitals:   11/27/20 2100 11/27/20 2130 11/27/20 2200 11/27/20 2300  BP: (!) 135/58 112/63 112/60 (!) 117/54  Pulse: (!) 59 62 64 64  Resp: (!) 24 16 (!) 21 16  Temp:      TempSrc:      SpO2: 100% 100% 98% 98%  Weight:      Height:       Constitutional: NAD, calm, comfortable Eyes: PERRL, lids and conjunctivae normal ENMT: Hard of hearing.  Mucous membranes are dry. Posterior pharynx clear of any exudate or lesions.Normal dentition.  Neck: normal, supple, no masses. Respiratory: Expiratory crackles throughout the lung fields.  Normal respiratory effort. No accessory muscle use.  Cardiovascular: Regular rate and rhythm, no murmurs / rubs / gallops. No extremity edema. 2+ pedal pulses. Abdomen: no tenderness, no masses palpated. No hepatosplenomegaly. Musculoskeletal: no clubbing / cyanosis. No joint deformity upper and lower extremities. Good ROM, no contractures. Normal muscle tone.  Skin: no rashes, lesions, ulcers. No induration Neurologic: CN 2-12 grossly intact. Sensation intact. Strength 5/5 in all 4.  Psychiatric: Awake, alert, not giving direct answers to orientation questions.   Labs on Admission: I have personally reviewed following labs and imaging studies  CBC: Recent Labs  Lab 11/27/20 2017  WBC 3.7*  HGB 13.6  HCT 40.9  MCV 85.9  PLT 341*   Basic Metabolic Panel: Recent Labs  Lab 11/27/20 2017  NA 132*  K 4.0  CL 97*  CO2 22  GLUCOSE 82  BUN 27*  CREATININE 0.98  CALCIUM 9.5   GFR: Estimated Creatinine Clearance: 35.6 mL/min (by C-G formula based on SCr of 0.98 mg/dL). Liver Function Tests: Recent Labs  Lab 11/27/20 2017  AST 79*  ALT 38  ALKPHOS 55  BILITOT 0.8  PROT 8.3*  ALBUMIN 3.4*   Recent Labs  Lab 11/27/20 2017  LIPASE 93*   No results for input(s): AMMONIA in the last 168 hours. Coagulation Profile: No results for input(s): INR, PROTIME in the last  168 hours. Cardiac Enzymes: No results for input(s): CKTOTAL, CKMB, CKMBINDEX, TROPONINI in the last 168 hours. BNP (last 3 results) No results for input(s): PROBNP in the last 8760 hours. HbA1C: No  results for input(s): HGBA1C in the last 72 hours. CBG: No results for input(s): GLUCAP in the last 168 hours. Lipid Profile: No results for input(s): CHOL, HDL, LDLCALC, TRIG, CHOLHDL, LDLDIRECT in the last 72 hours. Thyroid Function Tests: No results for input(s): TSH, T4TOTAL, FREET4, T3FREE, THYROIDAB in the last 72 hours. Anemia Panel: No results for input(s): VITAMINB12, FOLATE, FERRITIN, TIBC, IRON, RETICCTPCT in the last 72 hours. Urine analysis:    Component Value Date/Time   COLORURINE YELLOW (A) 11/27/2020 2017   APPEARANCEUR HAZY (A) 11/27/2020 2017   APPEARANCEUR Clear 10/04/2015 1018   LABSPEC 1.023 11/27/2020 2017   LABSPEC 1.015 02/27/2014 2007   PHURINE 5.0 11/27/2020 2017   GLUCOSEU NEGATIVE 11/27/2020 2017   GLUCOSEU Negative 02/27/2014 2007   HGBUR NEGATIVE 11/27/2020 2017   BILIRUBINUR NEGATIVE 11/27/2020 2017   BILIRUBINUR Negative 10/04/2015 1018   BILIRUBINUR Negative 02/27/2014 2007   KETONESUR 5 (A) 11/27/2020 2017   PROTEINUR NEGATIVE 11/27/2020 2017   NITRITE NEGATIVE 11/27/2020 2017   LEUKOCYTESUR NEGATIVE 11/27/2020 2017   LEUKOCYTESUR Trace 02/27/2014 2007    Radiological Exams on Admission: DG Chest Portable 1 View  Result Date: 11/27/2020 CLINICAL DATA:  COVID-19 positivity with recent syncopal episode EXAM: PORTABLE CHEST 1 VIEW COMPARISON:  01/07/2016 FINDINGS: Cardiac shadow is enlarged but stable. Mild patchy airspace opacities are noted slightly greater than that seen on the prior exam likely related to the underlying COVID diagnosis. No sizable effusion is seen. Postsurgical changes in the cervical spine are noted. No acute bony abnormality is seen. IMPRESSION: Patchy airspace opacities likely related to the given clinical history.  Electronically Signed   By: Inez Catalina M.D.   On: 11/27/2020 20:38    EKG: Personally reviewed. Sinus rhythm with first-degree AV block and RBBB.  Similar to prior.  Assessment/Plan Principal Problem:   COVID-19 virus infection Active Problems:   Anxiety and depression   Diabetes mellitus, type 2 ([place])   Essential (primary) hypertension   ILD (interstitial lung disease) ([place])   Arthritis or polyarthritis, rheumatoid ([place])   Lazette L Barley is a 88 y.o. female with medical history significant for interstitial lung disease, rheumatoid arthritis, T2DM, HTN, HLD, depression/anxiety who is admitted with generalized weakness and gastroenteritis due to COVID-19 viral infection.  COVID-19 viral infection with gastroenteritis: Patient is dehydrated from GI losses.  CXR with mild patchy airspace opacities although this is likely largely from her known ILD. -Start IV remdesivir -Oral Decadron -IV fluid hydration overnight -Combivent with albuterol as needed  Generalized weakness/syncope: Secondary to above.  Continue IV fluid hydration and mobilize with PT.  Acute metabolic encephalopathy: Due to dehydration/COVID-19 viral infection.  Continue management as above.  Delirium precautions.  Interstitial lung disease: Continue Combivent and albuterol as needed as above.  Supplemental O2 as needed.  Rheumatoid arthritis: Appears to be on Humira and methotrexate as an outpatient.  Type 2 diabetes: Hold home metformin.  Add SSI if needed.  Hypertension: Holding home antihypertensives for now.  Hyperlipidemia: Continue lovastatin.  Active hospice patient: Patient is active with hospice.  CODE STATUS is DNR.  Discussed with family, they are agreeable with current management in hospital.  DVT prophylaxis: Lovenox Code Status: DNR Family Communication: Discussed with patient's daughter by phone Disposition Plan: From home and likely discharge to back to home with hospice Consults  called: None Level of care: Med-Surg Admission status:  Status is: Observation  The patient remains OBS appropriate and will d/c before 2 midnights.  Dispo: The patient is  from: Home              Anticipated d/c is to: Home              Patient currently is not medically stable to d/c.   Difficult to place patient No   Zada Finders MD [facility]  If 7PM-7AM, please contact night-coverage [email or web address]  11/27/2020, 11:40 PM

## 2020-11-27 NOTE — ED Triage Notes (Signed)
Pt presents to the [facility] via EMS from home with c/o syncope and diarrhea. EMS states that pt is currently on hospice and being care for by family at home. Pt and family were diagnosed with COVID on Friday and symptoms have progressively worsened. EMS reports several episodes of diarrhea at home then had syncopal episode while attempting to get up to the restroom. Pt currently A&O with stable vital signs.

## 2020-11-28 ENCOUNTER — Encounter: Payer: Self-pay | Admitting: Internal Medicine

## 2020-11-28 DIAGNOSIS — Z923 Personal history of irradiation: Secondary | ICD-10-CM | POA: Diagnosis not present

## 2020-11-28 DIAGNOSIS — E860 Dehydration: Secondary | ICD-10-CM | POA: Diagnosis present

## 2020-11-28 DIAGNOSIS — R55 Syncope and collapse: Secondary | ICD-10-CM | POA: Diagnosis present

## 2020-11-28 DIAGNOSIS — U071 COVID-19: Secondary | ICD-10-CM | POA: Diagnosis present

## 2020-11-28 DIAGNOSIS — M1990 Unspecified osteoarthritis, unspecified site: Secondary | ICD-10-CM | POA: Diagnosis present

## 2020-11-28 DIAGNOSIS — Z853 Personal history of malignant neoplasm of breast: Secondary | ICD-10-CM | POA: Diagnosis not present

## 2020-11-28 DIAGNOSIS — M109 Gout, unspecified: Secondary | ICD-10-CM | POA: Diagnosis present

## 2020-11-28 DIAGNOSIS — E119 Type 2 diabetes mellitus without complications: Secondary | ICD-10-CM | POA: Diagnosis present

## 2020-11-28 DIAGNOSIS — Z7983 Long term (current) use of bisphosphonates: Secondary | ICD-10-CM | POA: Diagnosis not present

## 2020-11-28 DIAGNOSIS — F0150 Vascular dementia without behavioral disturbance: Secondary | ICD-10-CM | POA: Diagnosis present

## 2020-11-28 DIAGNOSIS — G9341 Metabolic encephalopathy: Secondary | ICD-10-CM | POA: Diagnosis present

## 2020-11-28 DIAGNOSIS — Z66 Do not resuscitate: Secondary | ICD-10-CM | POA: Diagnosis present

## 2020-11-28 DIAGNOSIS — I10 Essential (primary) hypertension: Secondary | ICD-10-CM | POA: Diagnosis present

## 2020-11-28 DIAGNOSIS — Z7982 Long term (current) use of aspirin: Secondary | ICD-10-CM | POA: Diagnosis not present

## 2020-11-28 DIAGNOSIS — Z79899 Other long term (current) drug therapy: Secondary | ICD-10-CM | POA: Diagnosis not present

## 2020-11-28 DIAGNOSIS — Z881 Allergy status to other antibiotic agents status: Secondary | ICD-10-CM | POA: Diagnosis not present

## 2020-11-28 DIAGNOSIS — F32A Depression, unspecified: Secondary | ICD-10-CM | POA: Diagnosis present

## 2020-11-28 DIAGNOSIS — D703 Neutropenia due to infection: Secondary | ICD-10-CM | POA: Diagnosis not present

## 2020-11-28 DIAGNOSIS — D709 Neutropenia, unspecified: Secondary | ICD-10-CM | POA: Diagnosis present

## 2020-11-28 DIAGNOSIS — A0839 Other viral enteritis: Secondary | ICD-10-CM | POA: Diagnosis present

## 2020-11-28 DIAGNOSIS — D696 Thrombocytopenia, unspecified: Secondary | ICD-10-CM | POA: Diagnosis not present

## 2020-11-28 DIAGNOSIS — Z7984 Long term (current) use of oral hypoglycemic drugs: Secondary | ICD-10-CM | POA: Diagnosis not present

## 2020-11-28 DIAGNOSIS — M0579 Rheumatoid arthritis with rheumatoid factor of multiple sites without organ or systems involvement: Secondary | ICD-10-CM | POA: Diagnosis not present

## 2020-11-28 DIAGNOSIS — D6959 Other secondary thrombocytopenia: Secondary | ICD-10-CM | POA: Diagnosis not present

## 2020-11-28 DIAGNOSIS — E785 Hyperlipidemia, unspecified: Secondary | ICD-10-CM | POA: Diagnosis present

## 2020-11-28 DIAGNOSIS — Z981 Arthrodesis status: Secondary | ICD-10-CM | POA: Diagnosis not present

## 2020-11-28 DIAGNOSIS — J849 Interstitial pulmonary disease, unspecified: Secondary | ICD-10-CM | POA: Diagnosis present

## 2020-11-28 DIAGNOSIS — M069 Rheumatoid arthritis, unspecified: Secondary | ICD-10-CM | POA: Diagnosis present

## 2020-11-28 LAB — CBC WITH DIFFERENTIAL/PLATELET
Abs Immature Granulocytes: 0 10*3/uL (ref 0.00–0.07)
Basophils Absolute: 0 10*3/uL (ref 0.0–0.1)
Basophils Relative: 0 %
Eosinophils Absolute: 0 10*3/uL (ref 0.0–0.5)
Eosinophils Relative: 0 %
HCT: 37.8 % (ref 36.0–46.0)
Hemoglobin: 12.3 g/dL (ref 12.0–15.0)
Immature Granulocytes: 0 %
Lymphocytes Relative: 57 %
Lymphs Abs: 1.6 10*3/uL (ref 0.7–4.0)
MCH: 28.3 pg (ref 26.0–34.0)
MCHC: 32.5 g/dL (ref 30.0–36.0)
MCV: 87.1 fL (ref 80.0–100.0)
Monocytes Absolute: 0.3 10*3/uL (ref 0.1–1.0)
Monocytes Relative: 9 %
Neutro Abs: 1 10*3/uL — ABNORMAL LOW (ref 1.7–7.7)
Neutrophils Relative %: 34 %
Platelets: 134 10*3/uL — ABNORMAL LOW (ref 150–400)
RBC: 4.34 MIL/uL (ref 3.87–5.11)
RDW: 15.2 % (ref 11.5–15.5)
WBC: 2.8 10*3/uL — ABNORMAL LOW (ref 4.0–10.5)
nRBC: 0 % (ref 0.0–0.2)

## 2020-11-28 LAB — COMPREHENSIVE METABOLIC PANEL
ALT: 33 U/L (ref 0–44)
AST: 66 U/L — ABNORMAL HIGH (ref 15–41)
Albumin: 3.4 g/dL — ABNORMAL LOW (ref 3.5–5.0)
Alkaline Phosphatase: 50 U/L (ref 38–126)
Anion gap: 11 (ref 5–15)
BUN: 22 mg/dL (ref 8–23)
CO2: 24 mmol/L (ref 22–32)
Calcium: 9.1 mg/dL (ref 8.9–10.3)
Chloride: 100 mmol/L (ref 98–111)
Creatinine, Ser: 0.78 mg/dL (ref 0.44–1.00)
GFR, Estimated: >60 mL/min (ref >60)
Glucose, Bld: 66 mg/dL — ABNORMAL LOW (ref 70–99)
Potassium: 4.1 mmol/L (ref 3.5–5.1)
Sodium: 135 mmol/L (ref 135–145)
Total Bilirubin: 0.8 mg/dL (ref 0.3–1.2)
Total Protein: 7.8 g/dL (ref 6.5–8.1)

## 2020-11-28 LAB — C-REACTIVE PROTEIN: CRP: 4.9 mg/dL — ABNORMAL HIGH (ref <1.0)

## 2020-11-28 LAB — PHOSPHORUS: Phosphorus: 3.2 mg/dL (ref 2.5–4.6)

## 2020-11-28 LAB — FERRITIN: Ferritin: 272 ng/mL (ref 11–307)

## 2020-11-28 LAB — MAGNESIUM: Magnesium: 1.5 mg/dL — ABNORMAL LOW (ref 1.7–2.4)

## 2020-11-28 LAB — D-DIMER, QUANTITATIVE: D-Dimer, Quant: >20 ug/mL-FEU — ABNORMAL HIGH (ref 0.00–0.50)

## 2020-11-28 MED ORDER — SODIUM CHLORIDE 0.9 % IV SOLN: 100.0000 mg | Freq: Every day | INTRAVENOUS | Status: DC

## 2020-11-28 MED ORDER — SODIUM CHLORIDE 0.9 % IV SOLN: 200.0000 mg | Freq: Once | INTRAVENOUS | Status: DC

## 2020-11-28 MED ORDER — PRAVASTATIN SODIUM 20 MG PO TABS
40.0000 mg | ORAL_TABLET | Freq: Every day | ORAL | Status: DC
  Administered 2020-11-28: 40 mg via ORAL
  Filled 2020-11-28: qty 2
  Filled 2020-11-28: qty 1

## 2020-11-28 MED ORDER — SODIUM CHLORIDE 0.9 % IV SOLN
100.0000 mg | Freq: Once | INTRAVENOUS | Status: AC
  Administered 2020-11-28: 100 mg via INTRAVENOUS
  Filled 2020-11-28 (×2): qty 20

## 2020-11-28 MED ORDER — LACTATED RINGERS IV SOLN: INTRAVENOUS | Status: AC

## 2020-11-28 MED ORDER — LOPERAMIDE HCL 2 MG PO CAPS: 2.0000 mg | ORAL_CAPSULE | ORAL | Status: DC | PRN

## 2020-11-28 MED ORDER — HYDROCOD POLST-CPM POLST ER 10-8 MG/5ML PO SUER: 5.0000 mL | Freq: Two times a day (BID) | ORAL | Status: DC | PRN

## 2020-11-28 MED ORDER — GUAIFENESIN-DM 100-10 MG/5ML PO SYRP: 10.0000 mL | ORAL_SOLUTION | ORAL | Status: DC | PRN

## 2020-11-28 MED ORDER — ENOXAPARIN SODIUM 40 MG/0.4ML IJ SOSY
40.0000 mg | PREFILLED_SYRINGE | INTRAMUSCULAR | Status: DC
  Administered 2020-11-28 – 2020-12-02 (×6): 40 mg via SUBCUTANEOUS
  Filled 2020-11-28 (×6): qty 0.4

## 2020-11-28 MED ORDER — IPRATROPIUM-ALBUTEROL 20-100 MCG/ACT IN AERS
1.0000 | INHALATION_SPRAY | Freq: Four times a day (QID) | RESPIRATORY_TRACT | Status: DC
  Administered 2020-11-28 – 2020-12-03 (×20): 1 via RESPIRATORY_TRACT
  Filled 2020-11-28: qty 4

## 2020-11-28 MED ORDER — ZINC SULFATE 220 (50 ZN) MG PO CAPS
220.0000 mg | ORAL_CAPSULE | Freq: Every day | ORAL | Status: DC
  Administered 2020-11-28 – 2020-12-03 (×6): 220 mg via ORAL
  Filled 2020-11-28 (×6): qty 1

## 2020-11-28 MED ORDER — MAGNESIUM SULFATE 2 GM/50ML IV SOLN
2.0000 g | Freq: Once | INTRAVENOUS | Status: AC
  Administered 2020-11-28: 10:00:00 2 g via INTRAVENOUS
  Filled 2020-11-28: qty 50

## 2020-11-28 MED ORDER — DEXAMETHASONE 4 MG PO TABS
6.0000 mg | ORAL_TABLET | ORAL | Status: DC
  Administered 2020-11-28 – 2020-12-02 (×6): 6 mg via ORAL
  Filled 2020-11-28 (×2): qty 2
  Filled 2020-11-28 (×2): qty 1
  Filled 2020-11-28 (×3): qty 2

## 2020-11-28 MED ORDER — ONDANSETRON HCL 4 MG PO TABS: 4.0000 mg | ORAL_TABLET | Freq: Four times a day (QID) | ORAL | Status: DC | PRN

## 2020-11-28 MED ORDER — ASCORBIC ACID 500 MG PO TABS
500.0000 mg | ORAL_TABLET | Freq: Every day | ORAL | Status: DC
  Administered 2020-11-28 – 2020-12-03 (×6): 500 mg via ORAL
  Filled 2020-11-28 (×6): qty 1

## 2020-11-28 MED ORDER — SODIUM CHLORIDE 0.9 % IV SOLN
100.0000 mg | Freq: Every day | INTRAVENOUS | Status: DC
  Administered 2020-11-28: 100 mg via INTRAVENOUS
  Filled 2020-11-28 (×2): qty 20
  Filled 2020-11-28: qty 100

## 2020-11-28 MED ORDER — LORAZEPAM 0.5 MG PO TABS
0.5000 mg | ORAL_TABLET | Freq: Three times a day (TID) | ORAL | Status: DC | PRN
  Administered 2020-11-30 – 2020-12-02 (×3): 0.5 mg via ORAL
  Filled 2020-11-28 (×3): qty 1

## 2020-11-28 MED ORDER — QUETIAPINE FUMARATE 25 MG PO TABS
25.0000 mg | ORAL_TABLET | Freq: Every day | ORAL | Status: DC
  Administered 2020-11-28 – 2020-11-29 (×2): 25 mg via ORAL
  Filled 2020-11-28 (×3): qty 1

## 2020-11-28 MED ORDER — SODIUM CHLORIDE 0.9 % IV SOLN
100.0000 mg | Freq: Once | INTRAVENOUS | Status: DC
  Filled 2020-11-28 (×3): qty 20

## 2020-11-28 MED ORDER — SODIUM CHLORIDE 0.9 % IV SOLN
100.0000 mg | Freq: Every day | INTRAVENOUS | Status: DC
  Administered 2020-11-29: 100 mg via INTRAVENOUS
  Filled 2020-11-28 (×2): qty 20
  Filled 2020-11-28: qty 100

## 2020-11-28 MED ORDER — ONDANSETRON HCL 4 MG/2ML IJ SOLN: 4.0000 mg | Freq: Four times a day (QID) | INTRAMUSCULAR | Status: DC | PRN

## 2020-11-28 MED ORDER — ACETAMINOPHEN 325 MG PO TABS
650.0000 mg | ORAL_TABLET | Freq: Four times a day (QID) | ORAL | Status: DC | PRN
  Filled 2020-11-28 (×2): qty 2

## 2020-11-28 MED ORDER — HALOPERIDOL LACTATE 5 MG/ML IJ SOLN
1.0000 mg | Freq: Once | INTRAMUSCULAR | Status: AC
  Administered 2020-11-28: 15:00:00 1 mg via INTRAVENOUS
  Filled 2020-11-28: qty 1

## 2020-11-28 NOTE — Evaluation (Signed)
Occupational Therapy Evaluation Patient Details Name: [name] MRN: [id number] DOB: [birth year] Today's Date: 11/28/2020    History of Present Illness presented to ER secondary to nausea, diarrhea, syncopal episode; admitted for management of covid-19 with viral gastroenteritis.   Clinical Impression   Pt sen for OT evaluation this date in setting of acute hospitalization d/t COVID-19. Pt is poor historian, but reports living alone. Eventually states something about her daughter helping, but unclear. This date, on ADL/ADL mobility assessment, Pt requires MOD A for rolling. MIN assist and moderate cues to sequence simple g/h tasks, bed level in high-fowler's position, TOTAL A for LB ADLs.    Follow Up Recommendations  SNF;Supervision/Assistance - 24 hour    Equipment Recommendations  Other (comment) (defer to next venue of care)    Recommendations for Other Services       Precautions / Restrictions Precautions Precautions: Fall Restrictions Weight Bearing Restrictions: No      Mobility Bed Mobility Overal bed mobility: Needs Assistance Bed Mobility: Rolling Rolling: Mod assist   Supine to sit: Min assist     General bed mobility comments: MOD A to roll for repositioning for skin    Transfers Overall transfer level: Needs assistance Equipment used: Rolling walker (2 wheeled) Transfers: Sit to/from Stand Sit to Stand: Min assist         General transfer comment: deferred, pt too fatigued, falls asleep w/in <30 secs of last roll to her right side.    Balance Overall balance assessment: Needs assistance Sitting-balance support: No upper extremity supported;Feet supported Sitting balance-Leahy Scale: Good     Standing balance support: Bilateral upper extremity supported Standing balance-Leahy Scale: Fair                             ADL either performed or assessed with clinical judgement   ADL Overall ADL's : Needs assistance/impaired                                        General ADL Comments: MIN assist and moderate cues to sequence simple g/h tasks, bed level in high-fowler's position, TOTAL A for LB ADLs.     Vision   Additional Comments: difficult to formally assess d/t cognition     Perception     Praxis      Pertinent Vitals/Pain Pain Assessment: No/denies pain     Hand Dominance     Extremity/Trunk Assessment Upper Extremity Assessment Upper Extremity Assessment: Generalized weakness   Lower Extremity Assessment Lower Extremity Assessment: Generalized weakness       Communication Communication Communication: No difficulties   Cognition Arousal/Alertness: Awake/alert Behavior During Therapy: WFL for tasks assessed/performed Overall Cognitive Status: No family/caregiver present to determine baseline cognitive functioning                                 General Comments: inconsistent command following, often requiring hand-over-hand assist to initiate/participate with movement; exceptionaly HOH.  Difficulty maintaining topic of conversation   General Comments       Exercises     Shoulder Instructions      Home Living Family/patient expects to be discharged to:: Private residence Living Arrangements: Alone Available Help at Discharge: Family;Available 24 hours/day Type of Home: House Home Access: Stairs to enter [employer] of Steps:  1   Home Layout: One level               Home Equipment: Walker - 2 wheels;Bedside commode;Shower seat;Grab bars - toilet          Prior Functioning/Environment          Comments: Patient poor historian; suspect use of RW at baseline (as patient seems familiar with use, and looks for use once sitting edge of bed)        OT Problem List: Decreased strength;Decreased activity tolerance;Impaired balance (sitting and/or standing);Decreased cognition;Decreased safety awareness;Decreased knowledge of use of DME  or AE;Decreased knowledge of precautions      OT Treatment/Interventions: Self-care/ADL training;DME and/or AE instruction;Therapeutic activities;Balance training;Therapeutic exercise;Patient/family education    OT Goals(Current goals can be found in the care plan section) Acute Rehab OT Goals Patient Stated Goal: none stated OT Goal Formulation: Patient unable to participate in goal setting Time For Goal Achievement: 12/12/20 Potential to Achieve Goals: Fair ADL Goals Pt Will Perform Eating: with set-up;sitting;with supervision (with <10% verbal cues) Pt Will Perform Grooming: with set-up;with supervision;sitting (with <10% verbal cues to complete one familiar hygiene task) Additional ADL Goal #1: Pt will sequence two familiar ADL tasks with only assist to initiate.  OT Frequency: Min 1X/week   Barriers to D/C:            Co-evaluation              AM-PAC OT "6 Clicks" Daily Activity     Outcome Measure Help from another person eating meals?: A Little Help from another person taking care of personal grooming?: A Little Help from another person toileting, which includes using toliet, bedpan, or urinal?: A Lot Help from another person bathing (including washing, rinsing, drying)?: A Lot Help from another person to put on and taking off regular upper body clothing?: A Lot Help from another person to put on and taking off regular lower body clothing?: Total 6 Click Score: 13   End of Session Nurse Communication: Mobility status  Activity Tolerance: Patient tolerated treatment well Patient left: in bed;with call bell/phone within reach;with bed alarm set  OT Visit Diagnosis: Unsteadiness on feet (R26.81);Muscle weakness (generalized) (M62.81);Adult, failure to thrive (R62.7)                Time: 6468-0321 OT Time Calculation (min): 8 min Charges:  OT General Charges $OT Visit: 1 Visit OT Evaluation $OT Eval Moderate Complexity: [place], MS, OTR/L ascom  [phone] 11/28/20, 4:31 PM

## 2020-11-28 NOTE — Evaluation (Addendum)
Physical Therapy Evaluation Patient Details Name: [name] MRN: [id number] DOB: 05/23/1932 Today's Date: 11/28/2020   History of Present Illness  presented to ER secondary to nausea, diarrhea, syncopal episode; admitted for management of covid-19 with viral gastroenteritis.  Clinical Impression  Patient resting in bed upon arrival to session; alert and oriented to self only.  Follows simple commands, but optimized with therapist demonstration, hand-over-hand guidance throughout session.  No pain indicators noted during session.  Generally weak and deconditioned throughout all extremities; no focal weakness throughout.  Able to complete bed mobility with min assist; sit/stand, basic transfers and gait (5') with RW, min assist.  Demonstrates hand-over-hand to guide RW and direction; short, shuffling steps, limited balance reactions with mobility tasks.  Do recommend RW and +1 for all mobility tasks at this time.  Additional gait efforts deferred, as lunch arrived to room. Would benefit from skilled PT to address above deficits and promote optimal return to PLOF; Recommend transition to [place] upon discharge from acute hospitalization.  Patient likely to benefit from return to familiar environment/routine with 24/7 supervision if family able to provide support.  HOWEVER, if 24/7 unavailable, may benefit from transition to STR.     Follow Up Recommendations Home health PT;Supervision/Assistance - 24 hour    Equipment Recommendations  Rolling walker with 5" wheels;3in1 (PT)    Recommendations for Other Services       Precautions / Restrictions Precautions Precautions: Fall Restrictions Weight Bearing Restrictions: No      Mobility  Bed Mobility Overal bed mobility: Needs Assistance Bed Mobility: Supine to Sit     Supine to sit: Min assist     General bed mobility comments: assist for truncal elevation    Transfers Overall transfer level: Needs assistance Equipment used:  Rolling walker (2 wheeled) Transfers: Sit to/from Stand Sit to Stand: Min assist         General transfer comment: pulls on RW despite cuing  Ambulation/Gait Ambulation/Gait assistance: Min assist Gait Distance (Feet): 5 Feet Assistive device: Rolling walker (2 wheeled)       General Gait Details: hand-over-hand to guide RW and direction; short, shuffling steps, limited balance reactions with mobiltiy tasks.  Do recommend RW and +1 for all mobility tasks at this time  Financial trader Rankin (Stroke Patients Only)       Balance Overall balance assessment: Needs assistance Sitting-balance support: No upper extremity supported;Feet supported Sitting balance-Leahy Scale: Good     Standing balance support: Bilateral upper extremity supported Standing balance-Leahy Scale: Fair                               Pertinent Vitals/Pain Pain Assessment: No/denies pain    Home Living Family/patient expects to be discharged to:: Private residence Living Arrangements: Alone Available Help at Discharge: Family;Available 24 hours/day Type of Home: House Home Access: Stairs to enter   [employer] of Steps: 1 Home Layout: One level Home Equipment: Walker - 2 wheels;Bedside commode;Shower seat;Grab bars - toilet      Prior Function           Comments: Patient poor historian; suspect use of RW at baseline (as patient seems familiar with use, and looks for use once sitting edge of bed)     Hand Dominance        Extremity/Trunk Assessment   Upper Extremity Assessment  Upper Extremity Assessment: Generalized weakness    Lower Extremity Assessment Lower Extremity Assessment: Generalized weakness (grossly 4-/5 throughout)       Communication   Communication: No difficulties  Cognition Arousal/Alertness: Awake/alert Behavior During Therapy: WFL for tasks assessed/performed Overall Cognitive Status: No  family/caregiver present to determine baseline cognitive functioning                                 General Comments: inconsistent command following, often requiring hand-over-hand assist to initiate/participate with movement; exceptionaly HOH.  Difficulty maintaining topic of conversation      General Comments      Exercises     Assessment/Plan    PT Assessment Patient needs continued PT services  PT Problem List Decreased strength;Decreased range of motion;Decreased cognition;Decreased balance;Decreased activity tolerance;Decreased mobility;Decreased coordination;Decreased knowledge of use of DME;Decreased safety awareness;Decreased knowledge of precautions;Cardiopulmonary status limiting activity       PT Treatment Interventions DME instruction;Gait training;Stair training;Functional mobility training;Therapeutic activities;Therapeutic exercise;Balance training;Patient/family education    PT Goals (Current goals can be found in the Care Plan section)  Acute Rehab PT Goals PT Goal Formulation: Patient unable to participate in goal setting Time For Goal Achievement: 12/12/20 Potential to Achieve Goals: Fair    Frequency Min 2X/week   Barriers to discharge        Co-evaluation               AM-PAC PT "6 Clicks" Mobility  Outcome Measure Help needed turning from your back to your side while in a flat bed without using bedrails?: None Help needed moving from lying on your back to sitting on the side of a flat bed without using bedrails?: A Little Help needed moving to and from a bed to a chair (including a wheelchair)?: A Little Help needed standing up from a chair using your arms (e.g., wheelchair or bedside chair)?: A Little Help needed to walk in hospital room?: A Little Help needed climbing 3-5 steps with a railing? : A Little 6 Click Score: 19    End of Session Equipment Utilized During Treatment: Gait belt Activity Tolerance: Patient tolerated  treatment well Patient left: in chair;with call bell/phone within reach;with chair alarm set Nurse Communication: Mobility status PT Visit Diagnosis: Muscle weakness (generalized) (M62.81);Difficulty in walking, not elsewhere classified (R26.2)    Time: 1583-0940 PT Time Calculation (min) (ACUTE ONLY): 27 min   Charges:   PT Evaluation $PT Eval Moderate Complexity: 1 Mod PT Treatments $Therapeutic Activity: 8-22 mins       Miko Markwood H. Owens Shark, PT, DPT, NCS 11/28/20, 2:02 PM [phone]

## 2020-11-28 NOTE — Progress Notes (Signed)
Remdesivir - Pharmacy Brief Note   O:  ALT:  38 CXR:  SpO2:99 % on 2 L    A/P:  Remdesivir 200 mg IVPB once followed by 100 mg IVPB daily x 4 days.   Simisola Sandles D 11/28/2020 12:39 AM

## 2020-11-28 NOTE — Plan of Care (Signed)

## 2020-11-28 NOTE — Progress Notes (Signed)
[place] [facility]) Hospitalized Hospice Patient  [name] is a current Hospice patient with a terminal diagnosis of abnormal weight loss, Rheumatoid arthritis, interstitial pulmonary disease and vascular dementia. She along with her family living in the home have all been diagnosed with Covid 19. On day of admission, she began having bouts of diarrhea and became so weak she was unable to help care for herself and her daughter who was also sick was unable to provide care. She is admitted with a diagnosis of Covid-19, this is a related admission per Dr. Eulas Post with [facility].    Report exchanged, unable to visit in the room due to COVID 19 restrictions. Patient has been sleeping more this morning after late night admission. Exchanged report with TOC, Jeanna, she has just spoken to patient's daughter [name] who is sick as well. She also reports that [name] is having serious concerns about her ability to continue to care for her mother. Report given to Hospice team to follow up.    V/S: Temp 97.5, HR 75, RR 16, BP 141/69, spO2 100 on 2L  I&O: 180ml IV fluids/555ml urine output recorded  IVS/PRNs: Remdesivir 100mg  daily, Zofran 4mg  every 6 hours as needed  Diagnostics:  CLINICAL DATA:  COVID-19 positivity with recent syncopal episode PORTABLE CHEST 1 VIEW   FINDINGS: Cardiac shadow is enlarged but stable. Mild patchy airspace opacities are noted slightly greater than that seen on the prior exam likely related to the underlying COVID diagnosis. No sizable effusion is seen. Postsurgical changes in the cervical spine are noted. No acute bony abnormality is seen.   IMPRESSION: Patchy airspace opacities likely related to the given clinical history.  Labs: Glucose 66, Magnesium 1.5, WBC 2.8, Platelets 134   Problem List: COVID-19 viral infection with gastroenteritis: Patient is dehydrated from GI losses.  CXR with mild patchy airspace opacities although this is likely largely from her  known ILD. -Start IV remdesivir -Oral Decadron -IV fluid hydration overnight -Combivent with albuterol as needed   Generalized weakness/syncope: Secondary to above.  Continue IV fluid hydration and mobilize with PT.   Acute metabolic encephalopathy: Due to dehydration/COVID-19 viral infection.  Continue management as above.  Delirium precautions.   Interstitial lung disease: Continue Combivent and albuterol as needed as above.  Supplemental O2 as needed.   Rheumatoid arthritis: Appears to be on Humira and methotrexate as an outpatient.   Type 2 diabetes: Hold home metformin.  Add SSI if needed.   Hypertension: Holding home antihypertensives for now.   Hyperlipidemia: Continue lovastatin.   Active hospice patient: Patient is active with hospice.  CODE STATUS is DNR.  Discussed with family, they are agreeable with current management in hospital.   GOC: Ongoing, family wants patient comfortable but is unsure if they will be able to take her back home .  D/C planning: Ongoing  Family contact: Spoke with daughter [name] by telephone  IDT: Hospice team updated.  Medication list and transfer summary placed on shadow chart.     Please call with any Hospice related questions or concerns.  Jhonnie Garner, Therapist, sports, BSN, [employer] [phone]

## 2020-11-28 NOTE — TOC Initial Note (Signed)
Transition of Care [facility]) - Initial/Assessment Note    Patient Details  Name: [name] MRN: [id number] Date of Birth: 1931-09-26  [facility] CM/SW Contact:    Shelbie Hutching, RN Phone Number: 11/28/2020, 10:46 AM  Clinical Narrative:                 Patient placed under observation for COVID.  RNCM called and spoke with patient's daughter, Amy via phone.  Patient lives with Amy and is a [facility] patient.  Amy has been caring for the patient at home but expresses that this is becoming increasingly difficult to the point she feels she can no longer care for her at home.  Misty with [facility] is aware of hospital stay and will follow patient while she is here, Misty notified that daughter can no longer care for patient at home.  TOC and Hospice will work on alternative plan.  Since COVID + as of May 27 patient will be on isolation until June 6th.    Expected Discharge Plan: [place] Barriers to Discharge: Continued Medical Work up   Patient Goals and CMS Choice Patient states their goals for this hospitalization and ongoing recovery are:: Daughter can no longer care for patient at home and needs help with alternative      Expected Discharge Plan and Services Expected Discharge Plan: [place] In-house Referral: Hospice / Palliative Care Discharge Planning Services: CM Consult   Living arrangements for the past 2 months: Single Family Home                 DME Arranged: N/A DME Agency: NA       HH Arranged: NA          Prior Living Arrangements/Services Living arrangements for the past 2 months: Single Family Home Lives with:: Adult Children Patient language and need for interpreter reviewed:: Yes Do you feel safe going back to the place where you live?: No   daughter does not feel able to care for patient any more at home  Need for Family Participation in Patient Care: Yes (Comment) (vascular dementia,  COVID) Care giver support system in place?: Yes (comment) Current home services: DME Criminal Activity/Legal Involvement Pertinent to Current Situation/Hospitalization: No - Comment as needed  Activities of Daily Living Home Assistive Devices/Equipment: Wheelchair ADL Screening (condition at time of admission) Patient's cognitive ability adequate to safely complete daily activities?: No Is the patient deaf or have difficulty hearing?: Yes Does the patient have difficulty seeing, even when wearing glasses/contacts?: Yes Does the patient have difficulty concentrating, remembering, or making decisions?: Yes Patient able to express need for assistance with ADLs?: No Does the patient have difficulty dressing or bathing?: Yes Independently performs ADLs?: No Communication: Dependent Is this a change from baseline?: Pre-admission baseline Dressing (OT): Dependent Is this a change from baseline?: Pre-admission baseline Grooming: Dependent Is this a change from baseline?: Pre-admission baseline Feeding: Dependent Is this a change from baseline?: Pre-admission baseline Bathing: Dependent Is this a change from baseline?: Pre-admission baseline Toileting: Dependent Is this a change from baseline?: Pre-admission baseline In/Out Bed: Dependent Is this a change from baseline?: Pre-admission baseline Walks in Home: Dependent Is this a change from baseline?: Pre-admission baseline Does the patient have difficulty walking or climbing stairs?: Yes Weakness of Legs: Both Weakness of Arms/Hands: Both  Permission Sought/Granted Permission sought to share information with : Case Manager,Family Chief Financial Officer Permission granted to share information with : Yes,  Verbal Permission Granted  Share Information with NAME: Amy  Permission granted to share info w AGENCY: [place] granted to share info w Relationship: Daughter     Emotional Assessment        Orientation: : Fluctuating Orientation (Suspected and/or reported Sundowners) Alcohol / Substance Use: Other (comment) Psych Involvement: No (comment)  Admission diagnosis:  Syncope, unspecified syncope type [R55] Diarrhea, unspecified type [R19.7] COVID [U07.1] COVID-19 virus infection [U07.1] Patient Active Problem List   Diagnosis Date Noted  . COVID-19 virus infection 11/27/2020  . Cough 08/20/2020  . Asymptomatic bacteriuria 08/04/2020  . Debility 08/01/2020  . Dizziness 04/06/2020  . Pneumonia 08/04/2018  . Malignant neoplasm of upper-outer quadrant of right breast in female, estrogen receptor negative ([place]) 04/29/2017  . Cervical spondylosis with radiculopathy 04/04/2016  . Recurrent UTI 10/06/2015  . Vaginal atrophy 10/06/2015  . Diabetes mellitus, type 2 ([place]) 08/02/2015  . ILD (interstitial lung disease) ([place]) 08/02/2015  . Headache, migraine 08/02/2015  . Inflammation of a vein 08/02/2015  . Anxiety and depression 10/15/2013  . Essential (primary) hypertension 10/15/2013  . Gout 10/15/2013  . Degeneration macular 10/15/2013  . Combined fat and carbohydrate induced hyperlipemia 10/15/2013  . Arthritis, degenerative 10/15/2013  . Arthritis or polyarthritis, rheumatoid ([place]) 10/15/2013   PCP:  Dion Body, MD Pharmacy:   [facility] [place], [employer] [facility] [address] [facility] [address] AT [address] [place] [facility] [place] [address] Phone: [phone] Fax: [phone]     Social Determinants of Health (SDOH) Interventions    Readmission Risk Interventions No flowsheet data found.

## 2020-11-28 NOTE — Progress Notes (Signed)
PROGRESS NOTE    [name]  LFY:[id number] DOB: 16-Jan-1932 DOA: 11/27/2020 PCP: Dion Body, MD    Brief Narrative:  [name] is a 87 y.o. female with medical history significant for interstitial lung disease, rheumatoid arthritis, T2DM, HTN, HLD, depression/anxiety who is currently active with hospice and presents to the ED for evaluation of nausea, diarrhea, and syncope at home.  History limited from patient due to hard of hearing and encephalopathy and is otherwise obtained from EDP, chart review, and daughter by phone.Daughter states that family in the house got COVID and then patient tested positive on 5/27.  Since then she has been having significant generalized weakness, fatigue, diaphoresis, chills, nausea, and today new diarrhea.  Family was attempting to get her up to the bedside commode and she had a syncopal episode.  She did not fall or injure herself.  EMS were called and patient was brought to the ED for further evaluation. SARS-CoV-2 PCR is positive.  Influenza A/B-. cxr Patchy airspace opacities likely related to the given clinical History.  5/30- spoke to daughter by phone. Daughter wants all treatment for covid. Also daughter reports mother has not walked much in 3 days since she got covid and needs PT.       Consultants:  hospice  Procedures:   Antimicrobials:  remdesivir  Subjective: Pt pulled her iv line out. Concentrating on this, rather answering my questions  Objective: Vitals:   11/28/20 0000 11/28/20 0124 11/28/20 0336 11/28/20 0736  BP: (!) 145/76 139/69 (!) 148/65 (!) 149/93  Pulse: 70 (!) 59 62 74  Resp: 19 20 20 16   Temp: 97.7 F (36.5 C) 97.9 F (36.6 C) 98.5 F (36.9 C) 97.9 F (36.6 C)  TempSrc: Oral     SpO2: 99% 98% 98% 99%  Weight:  59.4 kg    Height:  5\' 1"  (1.549 m)      Intake/Output Summary (Last 24 hours) at 11/28/2020 1118 Last data filed at 11/28/2020 1047 Gross per 24 hour  Intake 276.76 ml  Output 500  ml  Net -223.24 ml   Filed Weights   11/27/20 2011 11/28/20 0124  Weight: 65 kg 59.4 kg    Examination:  General exam: Appears calm and comfortable  Respiratory system: decrease bs at bases, no wheezing Cardiovascular system: S1 & S2 heard, RRR. No  gallops or clicks. No pedal edema. Gastrointestinal system: Abdomen is nondistended, soft and nontender. No organomegaly or masses felt. Normal bowel sounds heard. Central nervous system: Awake and alert, hard to assess  Extremities: no edema Skin: warm, dry Psychiatry: Mood & affect appropriate in current setting.     Data Reviewed: I have personally reviewed following labs and imaging studies  CBC: Recent Labs  Lab 11/27/20 2017 11/28/20 0308  WBC 3.7* 2.8*  NEUTROABS  --  1.0*  HGB 13.6 12.3  HCT 40.9 37.8  MCV 85.9 87.1  PLT 133* 852*   Basic Metabolic Panel: Recent Labs  Lab 11/27/20 2017 11/28/20 0308  NA 132* 135  K 4.0 4.1  CL 97* 100  CO2 22 24  GLUCOSE 82 66*  BUN 27* 22  CREATININE 0.98 0.78  CALCIUM 9.5 9.1  MG  --  1.5*  PHOS  --  3.2   GFR: Estimated Creatinine Clearance: 41.8 mL/min (by C-G formula based on SCr of 0.78 mg/dL). Liver Function Tests: Recent Labs  Lab 11/27/20 2017 11/28/20 0308  AST 79* 66*  ALT 38 33  ALKPHOS 55 50  BILITOT  0.8 0.8  PROT 8.3* 7.8  ALBUMIN 3.4* 3.4*   Recent Labs  Lab 11/27/20 2017  LIPASE 93*   No results for input(s): AMMONIA in the last 168 hours. Coagulation Profile: No results for input(s): INR, PROTIME in the last 168 hours. Cardiac Enzymes: No results for input(s): CKTOTAL, CKMB, CKMBINDEX, TROPONINI in the last 168 hours. BNP (last 3 results) No results for input(s): PROBNP in the last 8760 hours. HbA1C: No results for input(s): HGBA1C in the last 72 hours. CBG: No results for input(s): GLUCAP in the last 168 hours. Lipid Profile: No results for input(s): CHOL, HDL, LDLCALC, TRIG, CHOLHDL, LDLDIRECT in the last 72 hours. Thyroid  Function Tests: No results for input(s): TSH, T4TOTAL, FREET4, T3FREE, THYROIDAB in the last 72 hours. Anemia Panel: Recent Labs    11/28/20 0308  FERRITIN 272   Sepsis Labs: No results for input(s): PROCALCITON, LATICACIDVEN in the last 168 hours.  Recent Results (from the past 240 hour(s))  Resp Panel by RT-PCR (Flu A&B, Covid) Nasopharyngeal Swab     Status: Abnormal   Collection Time: 11/27/20  8:17 PM   Specimen: Nasopharyngeal Swab; Nasopharyngeal(NP) swabs in vial transport medium  Result Value Ref Range Status   SARS Coronavirus 2 by RT PCR POSITIVE (A) NEGATIVE Final    Comment: RESULT CALLED TO, READ BACK BY AND VERIFIED WITH: [place] @ 2110 11/27/20 LFD (NOTE) SARS-CoV-2 target nucleic acids are DETECTED.  The SARS-CoV-2 RNA is generally detectable in upper respiratory specimens during the acute phase of infection. Positive results are indicative of the presence of the identified virus, but do not rule out bacterial infection or co-infection with other pathogens not detected by the test. Clinical correlation with patient history and other diagnostic information is necessary to determine patient infection status. The expected result is Negative.  Fact Sheet for Patients: EntrepreneurPulse.com.au  Fact Sheet for Healthcare Providers: IncredibleEmployment.be  This test is not yet approved or cleared by the [place] FDA and  has been authorized for detection and/or diagnosis of SARS-CoV-2 by FDA under an Emergency Use Authorization (EUA).  This EUA will remain in effect (meaning this test can be  used) for the duration of  the COVID-19 declaration under Section 564(b)(1) of the Act, 21 U.S.C. section 360bbb-3(b)(1), unless the authorization is terminated or revoked sooner.     Influenza A by PCR NEGATIVE NEGATIVE Final   Influenza B by PCR NEGATIVE NEGATIVE Final    Comment: (NOTE) The Xpert Xpress SARS-CoV-2/FLU/RSV  plus assay is intended as an aid in the diagnosis of influenza from Nasopharyngeal swab specimens and should not be used as a sole basis for treatment. Nasal washings and aspirates are unacceptable for Xpert Xpress SARS-CoV-2/FLU/RSV testing.  Fact Sheet for Patients: EntrepreneurPulse.com.au  Fact Sheet for Healthcare Providers: IncredibleEmployment.be  This test is not yet approved or cleared by the [place] FDA and has been authorized for detection and/or diagnosis of SARS-CoV-2 by FDA under an Emergency Use Authorization (EUA). This EUA will remain in effect (meaning this test can be used) for the duration of the COVID-19 declaration under Section 564(b)(1) of the Act, 21 U.S.C. section 360bbb-3(b)(1), unless the authorization is terminated or revoked.  Performed at [facility], [address] [place], [place] [address]          Radiology Studies: DG Chest Portable 1 View  Result Date: 11/27/2020 CLINICAL DATA:  COVID-19 positivity with recent syncopal episode EXAM: PORTABLE CHEST 1 VIEW COMPARISON:  01/07/2016 FINDINGS: Cardiac shadow  is enlarged but stable. Mild patchy airspace opacities are noted slightly greater than that seen on the prior exam likely related to the underlying COVID diagnosis. No sizable effusion is seen. Postsurgical changes in the cervical spine are noted. No acute bony abnormality is seen. IMPRESSION: Patchy airspace opacities likely related to the given clinical history. Electronically Signed   By: Inez Catalina M.D.   On: 11/27/2020 20:38        Scheduled Meds: . vitamin C  500 mg Oral Daily  . dexamethasone  6 mg Oral Q24H  . enoxaparin (LOVENOX) injection  40 mg Subcutaneous Q24H  . Ipratropium-Albuterol  1 puff Inhalation Q6H  . pravastatin  40 mg Oral q1800  . zinc sulfate  220 mg Oral Daily   Continuous Infusions: . lactated ringers 100 mL/hr at 11/28/20 0211  . remdesivir 100 mg in  NS 100 mL     Followed by  . [START ON 11/29/2020] remdesivir 100 mg in NS 100 mL      Assessment & Plan:   Principal Problem:   COVID-19 virus infection Active Problems:   Anxiety and depression   Diabetes mellitus, type 2 ([place])   Essential (primary) hypertension   ILD (interstitial lung disease) ([place])   Arthritis or polyarthritis, rheumatoid ([place])   [name] is a 87 y.o. female with medical history significant for interstitial lung disease, rheumatoid arthritis, T2DM, HTN, HLD, depression/anxiety who is admitted with generalized weakness and gastroenteritis due to COVID-19 viral infection.  COVID-19 viral infection with gastroenteritis: Patient is dehydrated from GI losses.  CXR with mild patchy airspace opacities although this is likely largely from her known ILD. 5/30 continue iv remdesivir and steroid Continue inhalers   Generalized weakness/syncope: Due to covid infection Given ivf PT/OT   Acute metabolic encephalopathy: Due to dehydration/covid infection Delirium precautions Treat infection as above Add seroquel to bedtime.  Interstitial lung disease: Continue combivent and albuterol Keep 02 >92%    Rheumatoid arthritis: Appears to be on Humira and methotrexate as an outpatient.  Type 2 diabetes: Hold home metformin.  Add SSI if needed.  Hypertension: Holding home antihypertensives for now.  Hyperlipidemia: Continue lovastatin.   DVT prophylaxis: Lovenox Code Status: DNR Family Communication: Discussed with patient's daughter by phone  Status is: Inpatient  Remains inpatient appropriate because:Inpatient level of care appropriate due to severity of illness   Dispo: The patient is from: Home              Anticipated d/c is to: TBD              Patient currently is not medically stable to d/c.   Difficult to place patient No            LOS: 0 days   Time spent: 35 min with >50% on coc    Nolberto Hanlon, MD [facility] Pager [phone]  If 7PM-7AM, please contact night-coverage 11/28/2020, 11:18 AM

## 2020-11-28 NOTE — ED Notes (Signed)
Request made for transport  

## 2020-11-29 DIAGNOSIS — D709 Neutropenia, unspecified: Secondary | ICD-10-CM

## 2020-11-29 LAB — COMPREHENSIVE METABOLIC PANEL
ALT: 30 U/L (ref 0–44)
AST: 53 U/L — ABNORMAL HIGH (ref 15–41)
Albumin: 3.5 g/dL (ref 3.5–5.0)
Alkaline Phosphatase: 53 U/L (ref 38–126)
Anion gap: 12 (ref 5–15)
BUN: 17 mg/dL (ref 8–23)
CO2: 24 mmol/L (ref 22–32)
Calcium: 9.5 mg/dL (ref 8.9–10.3)
Chloride: 99 mmol/L (ref 98–111)
Creatinine, Ser: 0.71 mg/dL (ref 0.44–1.00)
GFR, Estimated: >60 mL/min (ref >60)
Glucose, Bld: 158 mg/dL — ABNORMAL HIGH (ref 70–99)
Potassium: 5 mmol/L (ref 3.5–5.1)
Sodium: 135 mmol/L (ref 135–145)
Total Bilirubin: 0.8 mg/dL (ref 0.3–1.2)
Total Protein: 8.1 g/dL (ref 6.5–8.1)

## 2020-11-29 LAB — CBC WITH DIFFERENTIAL/PLATELET
Abs Immature Granulocytes: 0.01 10*3/uL (ref 0.00–0.07)
Basophils Absolute: 0 10*3/uL (ref 0.0–0.1)
Basophils Relative: 0 %
Eosinophils Absolute: 0 10*3/uL (ref 0.0–0.5)
Eosinophils Relative: 0 %
HCT: 37.6 % (ref 36.0–46.0)
Hemoglobin: 13.1 g/dL (ref 12.0–15.0)
Immature Granulocytes: 1 %
Lymphocytes Relative: 62 %
Lymphs Abs: 0.7 10*3/uL (ref 0.7–4.0)
MCH: 29 pg (ref 26.0–34.0)
MCHC: 34.8 g/dL (ref 30.0–36.0)
MCV: 83.4 fL (ref 80.0–100.0)
Monocytes Absolute: 0.1 10*3/uL (ref 0.1–1.0)
Monocytes Relative: 7 %
Neutro Abs: 0.4 10*3/uL — CL (ref 1.7–7.7)
Neutrophils Relative %: 30 %
Platelets: 134 10*3/uL — ABNORMAL LOW (ref 150–400)
RBC: 4.51 MIL/uL (ref 3.87–5.11)
RDW: 15 % (ref 11.5–15.5)
Smear Review: NORMAL
WBC: 1.2 10*3/uL — CL (ref 4.0–10.5)
nRBC: 0 % (ref 0.0–0.2)

## 2020-11-29 LAB — GLUCOSE, CAPILLARY
Glucose-Capillary: 120 mg/dL — ABNORMAL HIGH (ref 70–99)
Glucose-Capillary: 137 mg/dL — ABNORMAL HIGH (ref 70–99)
Glucose-Capillary: 166 mg/dL — ABNORMAL HIGH (ref 70–99)

## 2020-11-29 LAB — PHOSPHORUS: Phosphorus: 3.9 mg/dL (ref 2.5–4.6)

## 2020-11-29 LAB — C-REACTIVE PROTEIN: CRP: 3.8 mg/dL — ABNORMAL HIGH (ref <1.0)

## 2020-11-29 LAB — MAGNESIUM: Magnesium: 1.7 mg/dL (ref 1.7–2.4)

## 2020-11-29 LAB — D-DIMER, QUANTITATIVE: D-Dimer, Quant: >20 ug/mL-FEU — ABNORMAL HIGH (ref 0.00–0.50)

## 2020-11-29 LAB — PATHOLOGIST SMEAR REVIEW

## 2020-11-29 LAB — FERRITIN: Ferritin: 274 ng/mL (ref 11–307)

## 2020-11-29 MED ORDER — INSULIN ASPART 100 UNIT/ML IJ SOLN
0.0000 [IU] | INTRAMUSCULAR | Status: DC
  Administered 2020-11-29 – 2020-11-30 (×2): 1 [IU] via SUBCUTANEOUS
  Administered 2020-11-30 (×2): 2 [IU] via SUBCUTANEOUS
  Administered 2020-11-30: 12:00:00 3 [IU] via SUBCUTANEOUS
  Administered 2020-12-01 – 2020-12-03 (×11): 2 [IU] via SUBCUTANEOUS
  Administered 2020-12-03: 3 [IU] via SUBCUTANEOUS
  Filled 2020-11-29 (×17): qty 1

## 2020-11-29 MED ORDER — SERTRALINE HCL 50 MG PO TABS
25.0000 mg | ORAL_TABLET | Freq: Every day | ORAL | Status: DC
  Administered 2020-11-29 – 2020-12-03 (×5): 25 mg via ORAL
  Filled 2020-11-29 (×5): qty 1

## 2020-11-29 NOTE — Progress Notes (Signed)
PROGRESS NOTE    KADINCE [name]  UQJ:[id number] DOB: Nov 27, 1931 DOA: 11/27/2020 PCP: Dion Body, MD    Brief Narrative:  [name] is a 87 y.o. female with medical history significant for interstitial lung disease, rheumatoid arthritis, T2DM, HTN, HLD, depression/anxiety who is currently active with hospice and presents to the ED for evaluation of nausea, diarrhea, and syncope at home.  History limited from patient due to hard of hearing and encephalopathy and is otherwise obtained from EDP, chart review, and [name] by phone.[name] states that family in the house got COVID and then patient tested positive on 5/27.  Since then she has been having significant generalized weakness, fatigue, diaphoresis, chills, nausea, and today new diarrhea.  Family was attempting to get her up to the bedside commode and she had a syncopal episode.  She did not fall or injure herself.  EMS were called and patient was brought to the ED for further evaluation. SARS-CoV-2 PCR is positive.  Influenza A/B-. cxr Patchy airspace opacities likely related to the given clinical History. Spoke to [name] by phone. [name] wants all treatment for covid. Also [name] reports mother has not walked much in 3 days since she got covid and needs PT.   We are treating for her COVID infection.  PT OT has been consulted.  Family unsure if they want to take patient back home.  Hospice has seen the patient.  Close to discharge need to discuss with family whether they want hospice house or home with hospice.  Also found with neutropenia.  5/31-pt still confused, and now wbc 1.2.  No diarrhea reported.  Wellness      Consultants:  hospice  Procedures:   Antimicrobials:  remdesivir  Subjective: Patient remains confused.  Denies any complaints.  She just mumbles while asking her questions.  Objective: Vitals:   11/28/20 2023 11/29/20 0411 11/29/20 0818 11/29/20 1131  BP:  (!) 172/79 128/80 (!) 150/80   Pulse: 60 64 60 64  Resp: 19 19 14 14   Temp: 98 F (36.7 C) 97.8 F (36.6 C) (!) 97.5 F (36.4 C) 97.7 F (36.5 C)  TempSrc: Oral Oral Oral Oral  SpO2: 100% 100% 99% 99%  Weight:      Height:        Intake/Output Summary (Last 24 hours) at 11/29/2020 1330 Last data filed at 11/29/2020 0500 Gross per 24 hour  Intake 322.09 ml  Output 550 ml  Net -227.91 ml   Filed Weights   11/27/20 2011 11/28/20 0124  Weight: 65 kg 59.4 kg    Examination: NAD calm Decreased breath sounds, no wheezing Regular S1-S2 no gallops Soft benign positive bowel sounds No edema Confused, grossly intact Mood and affect appropriate in current setting  Data Reviewed: I have personally reviewed following labs and imaging studies  CBC: Recent Labs  Lab 11/27/20 2017 11/28/20 0308 11/29/20 0445  WBC 3.7* 2.8* 1.2*  NEUTROABS  --  1.0* 0.4*  HGB 13.6 12.3 13.1  HCT 40.9 37.8 37.6  MCV 85.9 87.1 83.4  PLT 133* 134* 389*   Basic Metabolic Panel: Recent Labs  Lab 11/27/20 2017 11/28/20 0308 11/29/20 0445  NA 132* 135 135  K 4.0 4.1 5.0  CL 97* 100 99  CO2 22 24 24   GLUCOSE 82 66* 158*  BUN 27* 22 17  CREATININE 0.98 0.78 0.71  CALCIUM 9.5 9.1 9.5  MG  --  1.5* 1.7  PHOS  --  3.2 3.9   GFR: Estimated Creatinine Clearance:  41.8 mL/min (by C-G formula based on SCr of 0.71 mg/dL). Liver Function Tests: Recent Labs  Lab 11/27/20 2017 11/28/20 0308 11/29/20 0445  AST 79* 66* 53*  ALT 38 33 30  ALKPHOS 55 50 53  BILITOT 0.8 0.8 0.8  PROT 8.3* 7.8 8.1  ALBUMIN 3.4* 3.4* 3.5   Recent Labs  Lab 11/27/20 2017  LIPASE 93*   No results for input(s): AMMONIA in the last 168 hours. Coagulation Profile: No results for input(s): INR, PROTIME in the last 168 hours. Cardiac Enzymes: No results for input(s): CKTOTAL, CKMB, CKMBINDEX, TROPONINI in the last 168 hours. BNP (last 3 results) No results for input(s): PROBNP in the last 8760 hours. HbA1C: No results for input(s): HGBA1C  in the last 72 hours. CBG: No results for input(s): GLUCAP in the last 168 hours. Lipid Profile: No results for input(s): CHOL, HDL, LDLCALC, TRIG, CHOLHDL, LDLDIRECT in the last 72 hours. Thyroid Function Tests: No results for input(s): TSH, T4TOTAL, FREET4, T3FREE, THYROIDAB in the last 72 hours. Anemia Panel: Recent Labs    11/28/20 0308 11/29/20 0445  FERRITIN 272 274   Sepsis Labs: No results for input(s): PROCALCITON, LATICACIDVEN in the last 168 hours.  Recent Results (from the past 240 hour(s))  Resp Panel by RT-PCR (Flu A&B, Covid) Nasopharyngeal Swab     Status: Abnormal   Collection Time: 11/27/20  8:17 PM   Specimen: Nasopharyngeal Swab; Nasopharyngeal(NP) swabs in vial transport medium  Result Value Ref Range Status   SARS Coronavirus 2 by RT PCR POSITIVE (A) NEGATIVE Final    Comment: RESULT CALLED TO, READ BACK BY AND VERIFIED WITH: [place] @ 2110 11/27/20 LFD (NOTE) SARS-CoV-2 target nucleic acids are DETECTED.  The SARS-CoV-2 RNA is generally detectable in upper respiratory specimens during the acute phase of infection. Positive results are indicative of the presence of the identified virus, but do not rule out bacterial infection or co-infection with other pathogens not detected by the test. Clinical correlation with patient history and other diagnostic information is necessary to determine patient infection status. The expected result is Negative.  Fact Sheet for Patients: EntrepreneurPulse.com.au  Fact Sheet for Healthcare Providers: IncredibleEmployment.be  This test is not yet approved or cleared by the [place] FDA and  has been authorized for detection and/or diagnosis of SARS-CoV-2 by FDA under an Emergency Use Authorization (EUA).  This EUA will remain in effect (meaning this test can be  used) for the duration of  the COVID-19 declaration under Section 564(b)(1) of the Act, 21 U.S.C. section  360bbb-3(b)(1), unless the authorization is terminated or revoked sooner.     Influenza A by PCR NEGATIVE NEGATIVE Final   Influenza B by PCR NEGATIVE NEGATIVE Final    Comment: (NOTE) The Xpert Xpress SARS-CoV-2/FLU/RSV plus assay is intended as an aid in the diagnosis of influenza from Nasopharyngeal swab specimens and should not be used as a sole basis for treatment. Nasal washings and aspirates are unacceptable for Xpert Xpress SARS-CoV-2/FLU/RSV testing.  Fact Sheet for Patients: EntrepreneurPulse.com.au  Fact Sheet for Healthcare Providers: IncredibleEmployment.be  This test is not yet approved or cleared by the [place] FDA and has been authorized for detection and/or diagnosis of SARS-CoV-2 by FDA under an Emergency Use Authorization (EUA). This EUA will remain in effect (meaning this test can be used) for the duration of the COVID-19 declaration under Section 564(b)(1) of the Act, 21 U.S.C. section 360bbb-3(b)(1), unless the authorization is terminated or revoked.  Performed at [facility],  [place], [place] [address]          Radiology Studies: DG Chest Portable 1 View  Result Date: 11/27/2020 CLINICAL DATA:  COVID-19 positivity with recent syncopal episode EXAM: PORTABLE CHEST 1 VIEW COMPARISON:  01/07/2016 FINDINGS: Cardiac shadow is enlarged but stable. Mild patchy airspace opacities are noted slightly greater than that seen on the prior exam likely related to the underlying COVID diagnosis. No sizable effusion is seen. Postsurgical changes in the cervical spine are noted. No acute bony abnormality is seen. IMPRESSION: Patchy airspace opacities likely related to the given clinical history. Electronically Signed   By: Inez Catalina M.D.   On: 11/27/2020 20:38        Scheduled Meds: . vitamin C  500 mg Oral Daily  . dexamethasone  6 mg Oral Q24H  . enoxaparin (LOVENOX) injection  40 mg  Subcutaneous Q24H  . Ipratropium-Albuterol  1 puff Inhalation Q6H  . pravastatin  40 mg Oral q1800  . QUEtiapine  25 mg Oral QHS  . sertraline  25 mg Oral Daily  . zinc sulfate  220 mg Oral Daily   Continuous Infusions: . remdesivir 100 mg in NS 100 mL 100 mg (11/29/20 1203)    Assessment & Plan:   Principal Problem:   COVID-19 virus infection Active Problems:   Anxiety and depression   Diabetes mellitus, type 2 ([place])   Essential (primary) hypertension   ILD (interstitial lung disease) ([place])   Arthritis or polyarthritis, rheumatoid ([place])   Winifred L Kesling is a 87 y.o. female with medical history significant for interstitial lung disease, rheumatoid arthritis, T2DM, HTN, HLD, depression/anxiety who is admitted with generalized weakness and gastroenteritis due to COVID-19 viral infection.  COVID-19 viral infection with gastroenteritis: Patient is dehydrated from GI losses.  CXR with mild patchy airspace opacities although this is likely largely from her known ILD. 5/31 continue with IV remdesivir and steroids  Continue inhalers  DC statins since patient on remdesivir, monitor LFTs   Generalized weakness/syncope: Due to covid infection Received IV fluids  PT OT recommend home health with 24-hour supervision     Acute metabolic encephalopathy: Due to dehydration and COVID infection Delirium precautions Still confused Treat infection as above Continue Seroquel   Interstitial lung disease: Continue with combivent and albuterol Keep 02 sat >92%  Neutropenic- wbc continues to drop.  Today 1.2 Possibly due to covid Will consult hematology    Rheumatoid arthritis: Appears to be on Humira and methotrexate as an outpatient.  Type 2 diabetes: BG stable Hold home metformin.   RISS  Hypertension: Holding home antihypertensives for now.  Hyperlipidemia: Hold statin since being tx with remdesivir   DVT prophylaxis: Lovenox Code Status: DNR Family  Communication: Discussed with patient's [name] by phone  Status is: Inpatient  Remains inpatient appropriate because:Inpatient level of care appropriate due to severity of illness   Dispo: The patient is from: Home              Anticipated d/c is to: Home              Patient currently is not medically stable to d/c.   Difficult to place patient No            LOS: 1 day   Time spent: 45 min with >50% on coc    Nolberto Hanlon, MD [facility] Pager [phone]  If 7PM-7AM, please contact night-coverage 11/29/2020, 1:30 PM

## 2020-11-29 NOTE — TOC Progression Note (Signed)
Transition of Care [facility]) - Progression Note    Patient Details  Name: [name] MRN: [id number] Date of Birth: 07/02/1932  Transition of Care [facility]) CM/SW [place], RN Phone Number: 11/29/2020, 2:00 PM  Clinical Narrative:   Continues COVID Isolation.  [facility] following    Expected Discharge Plan: [place] Barriers to Discharge: Continued Medical Work up  Expected Discharge Plan and Services Expected Discharge Plan: [place] In-house Referral: Hospice / Palliative Care Discharge Planning Services: CM Consult   Living arrangements for the past 2 months: Single Family Home                 DME Arranged: N/A DME Agency: NA       HH Arranged: NA           Social Determinants of Health (SDOH) Interventions    Readmission Risk Interventions No flowsheet data found.

## 2020-11-29 NOTE — Consult Note (Addendum)
Hematology/Oncology Consult note [facility] [phone] [phone]  Patient Care Team: Dion Body, MD as PCP - General (Family Medicine) Jason Coop, NP as Nurse Practitioner   Name of the patient: [name]  [id number]  1931-12-22   Date of visit: 11/29/20 REASON FOR COSULTATION:  Neutropenia  History of presenting illness-  87 y.o. female with PMH listed at below who is currently admitted due to [place] infection with gastroenteritis, dehydration, acute metabolic encephalopathy.  Patient also has history of multiple medical problems including interstitial lung disease, rheumatoid arthritis.  Patient has been on Humira and methotrexate as outpatient. It was noted that patient has developed neutropenia and hematology was consulted. Patient is a poor historian, not able to provide medical history.  History was obtained from reviewing medical records. Upon admission, 11/27/2020, CBC showed mild WBC 3.7, platelet 133, no differential was done 11/28/2020, WBC decreased to 2.8, differential showed neutrophil 1.0 11/29/2020, further decrease of WBC to 1.2, A1c dropped to 8.4. Patient is afebrile, hemodynamically stable.  Patient has a remote history of breast cancer status post radiation.  Not followed up by her oncology.  Review of Systems  Unable to perform ROS: Mental status change    Allergies  Allergen Reactions  . Lisinopril Cough  . Oxycodone-Acetaminophen Other (See Comments)    Caused AMS & hallucinations  . Penicillins Other (See Comments)    Makes pt very "forgetful" Did it involve swelling of the face/tongue/throat, SOB, or low BP? Unknown Did it involve sudden or severe rash/hives, skin peeling, or any reaction on the inside of your mouth or nose? Unknown Did you need to seek medical attention at a hospital or doctor's office? Unknown When did it last happen? If all above answers are "NO", may  proceed with cephalosporin use.   . Clarithromycin Rash  . Sulfa Antibiotics Rash    Patient Active Problem List   Diagnosis Date Noted  . COVID-19 virus infection 11/27/2020  . Cough 08/20/2020  . Asymptomatic bacteriuria 08/04/2020  . Debility 08/01/2020  . Dizziness 04/06/2020  . Pneumonia 08/04/2018  . Malignant neoplasm of upper-outer quadrant of right breast in female, estrogen receptor negative ([place]) 04/29/2017  . Cervical spondylosis with radiculopathy 04/04/2016  . Recurrent UTI 10/06/2015  . Vaginal atrophy 10/06/2015  . Diabetes mellitus, type 2 ([place]) 08/02/2015  . ILD (interstitial lung disease) ([place]) 08/02/2015  . Headache, migraine 08/02/2015  . Inflammation of a vein 08/02/2015  . Anxiety and depression 10/15/2013  . Essential (primary) hypertension 10/15/2013  . Gout 10/15/2013  . Degeneration macular 10/15/2013  . Combined fat and carbohydrate induced hyperlipemia 10/15/2013  . Arthritis, degenerative 10/15/2013  . Arthritis or polyarthritis, rheumatoid ([place]) 10/15/2013     Past Medical History:  Diagnosis Date  . Anxiety   . Breast cancer ([place]) 2015   right- radiation  . Cough   . Depression   . Diabetes mellitus without complication ([place])   . Dysrhythmia   . Gout   . HLD (hyperlipidemia)   . HTN (hypertension)   . Hypertension   . Interstitial lung disease ([place])   . Lumbar spinal stenosis   . Macular degeneration   . Migraines   . Osteoarthritis   . Personal history of radiation therapy 2015   RIGHT lumpectomy w/ radiation 2015  . Phlebitis   . Pneumonia 08/2018  . Pulmonary nodule   . Rheumatoid arthritis [facility])      Past Surgical History:  Procedure Laterality Date  . ABDOMINAL  HYSTERECTOMY    . ANTERIOR CERVICAL DECOMP/DISCECTOMY FUSION N/A 04/04/2016   Procedure: ANTERIOR CERVICAL DECOMPRESSION/DISCECTOMY FUSION, INTERBODY PROSTHESIS,PLATE CERVICAL  FIVE-SIX,CERVICAL SIX-SEVEN,CERVICAL SEVEN-THORACIC ONE;  Surgeon: Newman Pies,  MD;  Location: [place];  Service: Neurosurgery;  Laterality: N/A;  . BREAST BIOPSY Right 02/21/2015   CALCIFICATION INVOLVING HYALINIZED STROMA AND BENIGN DUCTS  . BREAST BIOPSY Right 03/29/2016   FAT NECROSIS WITH CALCIFICATIONS.   Marland Kitchen BREAST BIOPSY Right 2015   + invasive mam ca  . BREAST BIOPSY Right 11/30/2016   BENIGN BREAST TISSUE WITH ORGANIZING FAT NECROSIS AND CHANGES   . COLONOSCOPY    . ESOPHAGOGASTRODUODENOSCOPY    . KYPHOPLASTY N/A 01/08/2019   Procedure: T-12, L1 KYPHOPLASTY;  Surgeon: Hessie Knows, MD;  Location: [facility];  Service: Orthopedics;  Laterality: N/A;  . LYMPH NODE BIOPSY Left   . MASTECTOMY, PARTIAL Right     Social History   Socioeconomic History  . Marital status: Widowed    Spouse name: Not on file  . Number of children: Not on file  . Years of education: Not on file  . Highest education level: Not on file  Occupational History  . Not on file  Tobacco Use  . Smoking status: Never Smoker  . Smokeless tobacco: Never Used  Vaping Use  . Vaping Use: Never used  Substance and Sexual Activity  . Alcohol use: No    Alcohol/week: 0.0 standard drinks  . Drug use: No  . Sexual activity: Not Currently  Other Topics Concern  . Not on file  Social History Narrative  . Not on file   Social Determinants of Health   Financial Resource Strain: Not on file  Food Insecurity: Not on file  Transportation Needs: Not on file  Physical Activity: Not on file  Stress: Not on file  Social Connections: Not on file  Intimate Partner Violence: Not on file     Family History  Problem Relation Age of Onset  . Colon cancer Mother   . Stomach cancer Mother   . Esophageal cancer Mother   . COPD Father   . Colon polyps Sister   . Ovarian cancer Sister   . Kidney disease Neg Hx   . Bladder Cancer Neg Hx   . Kidney cancer Neg Hx   . Breast cancer Neg Hx      Current Facility-Administered Medications:  .  acetaminophen (TYLENOL) tablet 650 mg, 650 mg, Oral, Q6H  PRN, Zada Finders R, MD .  ascorbic acid (VITAMIN C) tablet 500 mg, 500 mg, Oral, Daily, Zada Finders R, MD, 500 mg at 11/29/20 0944 .  chlorpheniramine-HYDROcodone (TUSSIONEX) 10-8 MG/5ML suspension 5 mL, 5 mL, Oral, Q12H PRN, Posey Pronto, Vishal R, MD .  dexamethasone (DECADRON) tablet 6 mg, 6 mg, Oral, Q24H, Zada Finders R, MD, 6 mg at 11/28/20 2148 .  enoxaparin (LOVENOX) injection 40 mg, 40 mg, Subcutaneous, Q24H, Zada Finders R, MD, 40 mg at 11/28/20 2148 .  guaiFENesin-dextromethorphan (ROBITUSSIN DM) 100-10 MG/5ML syrup 10 mL, 10 mL, Oral, Q4H PRN, Posey Pronto, Vishal R, MD .  Ipratropium-Albuterol (COMBIVENT) respimat 1 puff, 1 puff, Inhalation, Q6H, Zada Finders R, MD, 1 puff at 11/29/20 1430 .  loperamide (IMODIUM) capsule 2 mg, 2 mg, Oral, PRN, Posey Pronto, Vishal R, MD .  LORazepam (ATIVAN) tablet 0.5 mg, 0.5 mg, Oral, TID PRN, Kurtis Bushman, Sahar, MD .  ondansetron (ZOFRAN) tablet 4 mg, 4 mg, Oral, Q6H PRN **OR** ondansetron (ZOFRAN) injection 4 mg, 4 mg, Intravenous, Q6H PRN, Lenore Cordia, MD .  QUEtiapine (SEROQUEL) tablet 25 mg, 25 mg, Oral, QHS, Amery, Sahar, MD, 25 mg at 11/28/20 2148 .  remdesivir 100 mg in sodium chloride 0.9 % 100 mL IVPB, 100 mg, Intravenous, Daily, Hallaji, Sheema M, RPH, Last Rate: 200 mL/hr at 11/29/20 1203, 100 mg at 11/29/20 1203 .  sertraline (ZOLOFT) tablet 25 mg, 25 mg, Oral, Daily, Kurtis Bushman, Sahar, MD, 25 mg at 11/29/20 1430 .  zinc sulfate capsule 220 mg, 220 mg, Oral, Daily, Zada Finders R, MD, 220 mg at 11/29/20 0944   Physical exam:  Vitals:   11/28/20 2023 11/29/20 0411 11/29/20 0818 11/29/20 1131  BP:  (!) 172/79 128/80 (!) 150/80  Pulse: 60 64 60 64  Resp: 19 19 14 14   Temp: 98 F (36.7 C) 97.8 F (36.6 C) (!) 97.5 F (36.4 C) 97.7 F (36.5 C)  TempSrc: Oral Oral Oral Oral  SpO2: 100% 100% 99% 99%  Weight:      Height:       Physical Exam Constitutional:      General: She is not in acute distress.    Appearance: She is not diaphoretic.  HENT:      Head: Normocephalic and atraumatic.     Nose: Nose normal.     Mouth/Throat:     Pharynx: No oropharyngeal exudate.  Eyes:     General: No scleral icterus.    Pupils: Pupils are equal, round, and reactive to light.  Cardiovascular:     Rate and Rhythm: Normal rate and regular rhythm.     Heart sounds: No murmur heard.   Pulmonary:     Effort: Pulmonary effort is normal. No respiratory distress.     Breath sounds: No rales.     Comments: Decreased breath sound bilaterally Chest:     Chest wall: No tenderness.  Abdominal:     General: There is no distension.     Palpations: Abdomen is soft.     Tenderness: There is no abdominal tenderness.  Musculoskeletal:        General: Normal range of motion.     Cervical back: Normal range of motion and neck supple.  Skin:    General: Skin is warm and dry.     Findings: No erythema.  Neurological:     Mental Status: She is alert and oriented to person, place, and time.     Cranial Nerves: No cranial nerve deficit.     Motor: No abnormal muscle tone.     Coordination: Coordination normal.  Psychiatric:        Mood and Affect: Affect normal.         CMP Latest Ref Rng & Units 11/29/2020  Glucose 70 - 99 mg/dL 158(H)  BUN 8 - 23 mg/dL 17  Creatinine 0.44 - 1.00 mg/dL 0.71  Sodium 135 - 145 mmol/L 135  Potassium 3.5 - 5.1 mmol/L 5.0  Chloride 98 - 111 mmol/L 99  CO2 22 - 32 mmol/L 24  Calcium 8.9 - 10.3 mg/dL 9.5  Total Protein 6.5 - 8.1 g/dL 8.1  Total Bilirubin 0.3 - 1.2 mg/dL 0.8  Alkaline Phos 38 - 126 U/L 53  AST 15 - 41 U/L 53(H)  ALT 0 - 44 U/L 30   CBC Latest Ref Rng & Units 11/29/2020  WBC 4.0 - 10.5 K/uL 1.2(LL)  Hemoglobin 12.0 - 15.0 g/dL 13.1  Hematocrit 36.0 - 46.0 % 37.6  Platelets 150 - 400 K/uL 134(L)    RADIOGRAPHIC STUDIES: I have personally reviewed the radiological images  as listed and agreed with the findings in the report. DG Chest Portable 1 View  Result Date: 11/27/2020 CLINICAL DATA:   COVID-19 positivity with recent syncopal episode EXAM: PORTABLE CHEST 1 VIEW COMPARISON:  01/07/2016 FINDINGS: Cardiac shadow is enlarged but stable. Mild patchy airspace opacities are noted slightly greater than that seen on the prior exam likely related to the underlying COVID diagnosis. No sizable effusion is seen. Postsurgical changes in the cervical spine are noted. No acute bony abnormality is seen. IMPRESSION: Patchy airspace opacities likely related to the given clinical history. Electronically Signed   By: Inez Catalina M.D.   On: 11/27/2020 20:38    Assessment and plan-   #Neutropenia, ANC 0.4. Afebrile and hemodynamically stable. Smear was obtained which abnormal or immune leukocytes were identified.   I think most likely this is due to bone marrow suppression secondary to COVID 19 infection as well as gastroenteritis. Recommend CBC with differential daily.  Check vitamin B12 and folate.  Both are ordered. Recommend watchful waiting approach  #COVID-19 viral infection with gastroenteritis.  Managed per hospitalist team. Currently on IV remdesivir and steroids   remdesivir may cause marrow suppression-  #Rheumatoid arthritis, please hold methotrexate until counts recovers to [place] above 1.   she is not on currently.   Thank you for allowing me to participate in the care of this patient.   Earlie Server, MD, PhD Hematology Oncology [facility] Pager- [phone] 11/29/2020

## 2020-11-29 NOTE — Progress Notes (Signed)
[facility]) Hospitalized Hospice Patient Note  Ms. Drewry is a current Hospice patient with a terminal diagnosis of abnormal weight loss, Rheumatoid arthritis, interstitial pulmonary disease and vascular dementia. She along with her family living in the home have all been diagnosed with Covid 19. On day of admission, she began having bouts of diarrhea and became so weak she was unable to help care for herself and her daughter who was also sick was unable to provide care. She is admitted with a diagnosis of Covid-19, this is a related admission per Dr. Eulas Post with [facility].   Report exchanged, unable to visit in the room due to COVID 19 restrictions. Able to view patient from hallway. Patient more alert today. Eating some and able to feed herself. There are no needs that are currently being met by the hospital staff. Continuing to receive IV remdesivir for her COVID 19 infection.   V/S:Temp 97.7, HR 64, RR 16, BP 150/80, spO2 100 on 2L [place]  I&O: 360m/1050ml   IVS/PRNs:Remdesivir 101mdaily  Diagnostics: None today  Labs: Glucose 158, WBC 1.2, Platelets 134, CRP 3.8, Ferritin  274, D-Dimer  >20  Problem List: COVID-19 viral infection with gastroenteritis: Patient is dehydrated from GI losses. CXR with mild patchy airspace opacities although this is likely largely from her known ILD. -Start IV remdesivir -Oral Decadron -IV fluid hydration overnight -Combivent with albuterol as needed  Generalized weakness/syncope: Secondary to above. Continue IV fluid hydration and mobilize with PT.  Acute metabolic encephalopathy: Due to dehydration/COVID-19 viral infection. Continue management as above. Delirium precautions. Per report she is more alert and moving towards her baseline.  Interstitial lung disease: Continue Combivent and albuterol as needed as above. Supplemental O2 as needed.  Active hospice patient: Patient is active with hospice. CODE STATUS is  DNR. Discussed with family, they are agreeable with current management in hospital.  GOYQI:[id number] wants patient comfortable but is unsure if they will be able to take her back home .  D/C planning:Ongoing  Family contact: Spoke with daughter Amy by telephone  IDDGL:OVFIEPPeam updated.  JeLoren RacerRN, BSN [facility]iaison [phone]

## 2020-11-29 NOTE — Progress Notes (Signed)
Cross coverage  Critical WBC and absolute neutrophil count - Neutropenic precautions - Consult oncology/ID in the a.m.

## 2020-11-30 DIAGNOSIS — D703 Neutropenia due to infection: Secondary | ICD-10-CM

## 2020-11-30 DIAGNOSIS — D696 Thrombocytopenia, unspecified: Secondary | ICD-10-CM

## 2020-11-30 LAB — CBC WITH DIFFERENTIAL/PLATELET
Abs Immature Granulocytes: 0.01 10*3/uL (ref 0.00–0.07)
Basophils Absolute: 0 10*3/uL (ref 0.0–0.1)
Basophils Relative: 0 %
Eosinophils Absolute: 0 10*3/uL (ref 0.0–0.5)
Eosinophils Relative: 0 %
HCT: 36.8 % (ref 36.0–46.0)
Hemoglobin: 12.6 g/dL (ref 12.0–15.0)
Immature Granulocytes: 0 %
Lymphocytes Relative: 45 %
Lymphs Abs: 1 10*3/uL (ref 0.7–4.0)
MCH: 28.8 pg (ref 26.0–34.0)
MCHC: 34.2 g/dL (ref 30.0–36.0)
MCV: 84.2 fL (ref 80.0–100.0)
Monocytes Absolute: 0.2 10*3/uL (ref 0.1–1.0)
Monocytes Relative: 8 %
Neutro Abs: 1.1 10*3/uL — ABNORMAL LOW (ref 1.7–7.7)
Neutrophils Relative %: 47 %
Platelets: 142 10*3/uL — ABNORMAL LOW (ref 150–400)
RBC: 4.37 MIL/uL (ref 3.87–5.11)
RDW: 15.3 % (ref 11.5–15.5)
Smear Review: NORMAL
WBC: 2.3 10*3/uL — ABNORMAL LOW (ref 4.0–10.5)
nRBC: 0 % (ref 0.0–0.2)

## 2020-11-30 LAB — COMPREHENSIVE METABOLIC PANEL
ALT: 27 U/L (ref 0–44)
AST: 41 U/L (ref 15–41)
Albumin: 3.4 g/dL — ABNORMAL LOW (ref 3.5–5.0)
Alkaline Phosphatase: 51 U/L (ref 38–126)
Anion gap: 10 (ref 5–15)
BUN: 22 mg/dL (ref 8–23)
CO2: 25 mmol/L (ref 22–32)
Calcium: 9.3 mg/dL (ref 8.9–10.3)
Chloride: 100 mmol/L (ref 98–111)
Creatinine, Ser: 0.58 mg/dL (ref 0.44–1.00)
GFR, Estimated: >60 mL/min (ref >60)
Glucose, Bld: 165 mg/dL — ABNORMAL HIGH (ref 70–99)
Potassium: 4.2 mmol/L (ref 3.5–5.1)
Sodium: 135 mmol/L (ref 135–145)
Total Bilirubin: 0.8 mg/dL (ref 0.3–1.2)
Total Protein: 8.1 g/dL (ref 6.5–8.1)

## 2020-11-30 LAB — VITAMIN B12: Vitamin B-12: 569 pg/mL (ref 180–914)

## 2020-11-30 LAB — MAGNESIUM: Magnesium: 1.7 mg/dL (ref 1.7–2.4)

## 2020-11-30 LAB — GLUCOSE, CAPILLARY
Glucose-Capillary: 151 mg/dL — ABNORMAL HIGH (ref 70–99)
Glucose-Capillary: 137 mg/dL — ABNORMAL HIGH (ref 70–99)
Glucose-Capillary: 239 mg/dL — ABNORMAL HIGH (ref 70–99)
Glucose-Capillary: 108 mg/dL — ABNORMAL HIGH (ref 70–99)
Glucose-Capillary: 118 mg/dL — ABNORMAL HIGH (ref 70–99)

## 2020-11-30 LAB — PHOSPHORUS: Phosphorus: 2.7 mg/dL (ref 2.5–4.6)

## 2020-11-30 LAB — HEMOGLOBIN A1C
Hgb A1c MFr Bld: 6.9 % — ABNORMAL HIGH (ref 4.8–5.6)
Mean Plasma Glucose: 151 mg/dL

## 2020-11-30 LAB — D-DIMER, QUANTITATIVE: D-Dimer, Quant: >20 ug/mL-FEU — ABNORMAL HIGH (ref 0.00–0.50)

## 2020-11-30 LAB — C-REACTIVE PROTEIN: CRP: 2.6 mg/dL — ABNORMAL HIGH (ref <1.0)

## 2020-11-30 LAB — FERRITIN: Ferritin: 231 ng/mL (ref 11–307)

## 2020-11-30 LAB — FOLATE: Folate: 27 ng/mL (ref >5.9)

## 2020-11-30 MED ORDER — SODIUM CHLORIDE 0.9 % IV SOLN
100.0000 mg | Freq: Every day | INTRAVENOUS | Status: AC
  Administered 2020-11-30 – 2020-12-02 (×3): 100 mg via INTRAVENOUS
  Filled 2020-11-30 (×2): qty 100
  Filled 2020-11-30: qty 20

## 2020-11-30 NOTE — Progress Notes (Addendum)
[place] [facility]) Hospitalized Hospice Patient Note  [name] is a current Hospice patient with a terminal diagnosis of abnormal weight loss, rheumatoid arthritis, interstitial pulmonary disease and vascular dementia. She, along with her family living in the home, have all been diagnosed with Covid 19. On day of admission, she began having bouts of diarrhea and became so weak she was unable to help care for herself. Her daughter, who was also sick, was unable to provide care for her and the decision was made to send her to the ED. She is admitted with a diagnosis of Covid-19, this is a related admission per Dr. Eulas Post with [facility].   Unable to visit at the bedside due to covid-19 restrictions. Report exchanged. Spoke with daughter [name] and provided an update. [name] is concerned about if she will be able to continue to provide care for her mother if "she cannot walk". Normally, [name] is ambulatory and only became immobile since she became ill. [name] wants to be able to have her mother return home, as her mother is most comfortable with her family, but is concerned about her mobility. Advised that PT/OT are evaluating her and hopefully as her infection clears, she will regain more mobility. Discussed possibility of needing a hospital bed depending on how functional she is at time of discharge, [facility] will continue to assess and order if needed.   She remains inpatient appropriate due to treatment of covid-19.   V/S: 98.1 oral, 148/58, HR 65, RR 17, SPO2 98% on O2 @ 2 lpm I&O: 240/600 Labs: CRP 2.6, ferritin 231, WBC 2.3, plt 142, d-dimer > 20 Diagnostics: none IVs/PRNs: remdesivir 100 mg IV QD  Problem List: COVID-19 viral infection with gastroenteritis: Patient is dehydrated from GI losses. CXR with mild patchy airspace opacities although this is likely largely from her known ILD. -Start IV remdesivir -Oral Decadron -IV fluid hydration overnight -Combivent  with albuterol as needed  Neutropenia: -hematology evaluated, feels likely due to bone marrow suppression secondary to covid 19 infection -WBCs 3.7 > 2.8 > 1.2 > 2.8 today    Generalized weakness/syncope: Secondary to above.  Continue IV fluid hydration and mobilize with PT. Baseline she is ambulatory. She was able to ambulate with PT and RW.  Acute metabolic encephalopathy: Due to dehydration/COVID-19 viral infection.  Delirium precautions.  Per report she is more alert and moving towards her baseline.   Interstitial lung disease: Continue Combivent and albuterol as needed as above.  Supplemental O2 as needed. Currently on 2 lpm, down from 4 lpm  Active hospice patient: Patient is active with hospice.  CODE STATUS is DNR.  Discussed with family, they are agreeable with current management in hospital.  YYT:[id number], family wants patient comfortable but is unsure if they will be able to take her back home. [name] is able to manage her as long as she has some mobility, which she had prior to covid-19.  .  D/C planning:Ongoing. May need additional DME in the home (hospital bed) if patient is not more mobile at the time of d/c.  Family contact: Spoke with daughter [name] by telephone, provided update and support.   CLE:[id number] team updated.  Venia Carbon RN, BSN, [facility] Liaison

## 2020-11-30 NOTE — Progress Notes (Signed)
Hematology/Oncology Progress Note [facility] [phone] [phone]  Patient Care Team: Dion Body, MD as PCP - General (Family Medicine) Jason Coop, NP as Nurse Practitioner   Name of the patient: [name]  [id number]  25-Sep-1931  Date of visit: 11/30/20   INTERVAL HISTORY-  No acute overnight events.  Review of systems- Review of Systems  Unable to perform ROS: Mental status change    Allergies  Allergen Reactions  . Lisinopril Cough  . Oxycodone-Acetaminophen Other (See Comments)    Caused AMS & hallucinations  . Penicillins Other (See Comments)    Makes pt very "forgetful" Did it involve swelling of the face/tongue/throat, SOB, or low BP? Unknown Did it involve sudden or severe rash/hives, skin peeling, or any reaction on the inside of your mouth or nose? Unknown Did you need to seek medical attention at a hospital or doctor's office? Unknown When did it last happen? If all above answers are "NO", may proceed with cephalosporin use.   . Clarithromycin Rash  . Sulfa Antibiotics Rash    Patient Active Problem List   Diagnosis Date Noted  . Neutropenia ([place])   . COVID-19 virus infection 11/27/2020  . Cough 08/20/2020  . Asymptomatic bacteriuria 08/04/2020  . Debility 08/01/2020  . Dizziness 04/06/2020  . Pneumonia 08/04/2018  . Malignant neoplasm of upper-outer quadrant of right breast in female, estrogen receptor negative ([place]) 04/29/2017  . Cervical spondylosis with radiculopathy 04/04/2016  . Recurrent UTI 10/06/2015  . Vaginal atrophy 10/06/2015  . Diabetes mellitus, type 2 ([place]) 08/02/2015  . ILD (interstitial lung disease) ([place]) 08/02/2015  . Headache, migraine 08/02/2015  . Inflammation of a vein 08/02/2015  . Anxiety and depression 10/15/2013  . Essential (primary) hypertension 10/15/2013  . Gout 10/15/2013  . Degeneration macular 10/15/2013  . Combined fat and carbohydrate  induced hyperlipemia 10/15/2013  . Arthritis, degenerative 10/15/2013  . Arthritis or polyarthritis, rheumatoid ([place]) 10/15/2013     Past Medical History:  Diagnosis Date  . Anxiety   . Breast cancer ([place]) 2015   right- radiation  . Cough   . Depression   . Diabetes mellitus without complication ([place])   . Dysrhythmia   . Gout   . HLD (hyperlipidemia)   . HTN (hypertension)   . Hypertension   . Interstitial lung disease ([place])   . Lumbar spinal stenosis   . Macular degeneration   . Migraines   . Osteoarthritis   . Personal history of radiation therapy 2015   RIGHT lumpectomy w/ radiation 2015  . Phlebitis   . Pneumonia 08/2018  . Pulmonary nodule   . Rheumatoid arthritis [facility])      Past Surgical History:  Procedure Laterality Date  . ABDOMINAL HYSTERECTOMY    . ANTERIOR CERVICAL DECOMP/DISCECTOMY FUSION N/A 04/04/2016   Procedure: ANTERIOR CERVICAL DECOMPRESSION/DISCECTOMY FUSION, INTERBODY PROSTHESIS,PLATE CERVICAL  FIVE-SIX,CERVICAL SIX-SEVEN,CERVICAL SEVEN-THORACIC ONE;  Surgeon: Newman Pies, MD;  Location: [place];  Service: Neurosurgery;  Laterality: N/A;  . BREAST BIOPSY Right 02/21/2015   CALCIFICATION INVOLVING HYALINIZED STROMA AND BENIGN DUCTS  . BREAST BIOPSY Right 03/29/2016   FAT NECROSIS WITH CALCIFICATIONS.   Marland Kitchen BREAST BIOPSY Right 2015   + invasive mam ca  . BREAST BIOPSY Right 11/30/2016   BENIGN BREAST TISSUE WITH ORGANIZING FAT NECROSIS AND CHANGES   . COLONOSCOPY    . ESOPHAGOGASTRODUODENOSCOPY    . KYPHOPLASTY N/A 01/08/2019   Procedure: T-12, L1 KYPHOPLASTY;  Surgeon: Hessie Knows, MD;  Location: [facility];  Service:  Orthopedics;  Laterality: N/A;  . LYMPH NODE BIOPSY Left   . MASTECTOMY, PARTIAL Right     Social History   Socioeconomic History  . Marital status: Widowed    Spouse name: Not on file  . Number of children: Not on file  . Years of education: Not on file  . Highest education level: Not on file  Occupational History  . Not on  file  Tobacco Use  . Smoking status: Never Smoker  . Smokeless tobacco: Never Used  Vaping Use  . Vaping Use: Never used  Substance and Sexual Activity  . Alcohol use: No    Alcohol/week: 0.0 standard drinks  . Drug use: No  . Sexual activity: Not Currently  Other Topics Concern  . Not on file  Social History Narrative  . Not on file   Social Determinants of Health   Financial Resource Strain: Not on file  Food Insecurity: Not on file  Transportation Needs: Not on file  Physical Activity: Not on file  Stress: Not on file  Social Connections: Not on file  Intimate Partner Violence: Not on file     Family History  Problem Relation Age of Onset  . Colon cancer Mother   . Stomach cancer Mother   . Esophageal cancer Mother   . COPD Father   . Colon polyps Sister   . Ovarian cancer Sister   . Kidney disease Neg Hx   . Bladder Cancer Neg Hx   . Kidney cancer Neg Hx   . Breast cancer Neg Hx      Current Facility-Administered Medications:  .  acetaminophen (TYLENOL) tablet 650 mg, 650 mg, Oral, Q6H PRN, Zada Finders R, MD .  ascorbic acid (VITAMIN C) tablet 500 mg, 500 mg, Oral, Daily, Zada Finders R, MD, 500 mg at 11/30/20 0921 .  chlorpheniramine-HYDROcodone (TUSSIONEX) 10-8 MG/5ML suspension 5 mL, 5 mL, Oral, Q12H PRN, Posey Pronto, Vishal R, MD .  dexamethasone (DECADRON) tablet 6 mg, 6 mg, Oral, Q24H, Zada Finders R, MD, 6 mg at 11/29/20 2111 .  enoxaparin (LOVENOX) injection 40 mg, 40 mg, Subcutaneous, Q24H, Zada Finders R, MD, 40 mg at 11/29/20 2112 .  guaiFENesin-dextromethorphan (ROBITUSSIN DM) 100-10 MG/5ML syrup 10 mL, 10 mL, Oral, Q4H PRN, Patel, Vishal R, MD .  insulin aspart (novoLOG) injection 0-9 Units, 0-9 Units, Subcutaneous, Q4H, Nolberto Hanlon, MD, 3 Units at 11/30/20 1227 .  Ipratropium-Albuterol (COMBIVENT) respimat 1 puff, 1 puff, Inhalation, Q6H, Lenore Cordia, MD, 1 puff at 11/30/20 0922 .  loperamide (IMODIUM) capsule 2 mg, 2 mg, Oral, PRN, Posey Pronto,  Vishal R, MD .  LORazepam (ATIVAN) tablet 0.5 mg, 0.5 mg, Oral, TID PRN, Nolberto Hanlon, MD .  ondansetron (ZOFRAN) tablet 4 mg, 4 mg, Oral, Q6H PRN **OR** ondansetron (ZOFRAN) injection 4 mg, 4 mg, Intravenous, Q6H PRN, Zada Finders R, MD .  QUEtiapine (SEROQUEL) tablet 25 mg, 25 mg, Oral, QHS, Amery, Sahar, MD, 25 mg at 11/29/20 2111 .  remdesivir 100 mg in sodium chloride 0.9 % 100 mL IVPB, 100 mg, Intravenous, Daily, Gevena Barre K, MD .  sertraline (ZOLOFT) tablet 25 mg, 25 mg, Oral, Daily, Nolberto Hanlon, MD, 25 mg at 11/30/20 0921 .  zinc sulfate capsule 220 mg, 220 mg, Oral, Daily, Zada Finders R, MD, 220 mg at 11/30/20 7939   Physical exam:  Vitals:   11/29/20 2356 11/30/20 0355 11/30/20 0910 11/30/20 1231  BP: (!) 141/76 (!) 158/88 (!) 148/58 138/74  Pulse: (!) 51 77 65 82  Resp: 14 14 17 17   Temp: 98.3 F (36.8 C) 98.6 F (37 C) 98.1 F (36.7 C)   TempSrc:   Oral   SpO2: 98% 98% 98% 96%  Weight:      Height:       Physical Exam Constitutional:      Appearance: Normal appearance.  Eyes:     General: No scleral icterus. Cardiovascular:     Rate and Rhythm: Normal rate.  Abdominal:     Palpations: Abdomen is soft.  Musculoskeletal:     Cervical back: Normal range of motion.  Skin:    General: Skin is warm and dry.  Neurological:     Mental Status: She is alert.  Psychiatric:        Mood and Affect: Mood normal.        CMP Latest Ref Rng & Units 11/30/2020  Glucose 70 - 99 mg/dL 165(H)  BUN 8 - 23 mg/dL 22  Creatinine 0.44 - 1.00 mg/dL 0.58  Sodium 135 - 145 mmol/L 135  Potassium 3.5 - 5.1 mmol/L 4.2  Chloride 98 - 111 mmol/L 100  CO2 22 - 32 mmol/L 25  Calcium 8.9 - 10.3 mg/dL 9.3  Total Protein 6.5 - 8.1 g/dL 8.1  Total Bilirubin 0.3 - 1.2 mg/dL 0.8  Alkaline Phos 38 - 126 U/L 51  AST 15 - 41 U/L 41  ALT 0 - 44 U/L 27   CBC Latest Ref Rng & Units 11/30/2020  WBC 4.0 - 10.5 K/uL 2.3(L)  Hemoglobin 12.0 - 15.0 g/dL 12.6  Hematocrit 36.0 - 46.0 %  36.8  Platelets 150 - 400 K/uL 142(L)    RADIOGRAPHIC STUDIES: I have personally reviewed the radiological images as listed and agreed with the findings in the report. DG Chest Portable 1 View  Result Date: 11/27/2020 CLINICAL DATA:  COVID-19 positivity with recent syncopal episode EXAM: PORTABLE CHEST 1 VIEW COMPARISON:  01/07/2016 FINDINGS: Cardiac shadow is enlarged but stable. Mild patchy airspace opacities are noted slightly greater than that seen on the prior exam likely related to the underlying COVID diagnosis. No sizable effusion is seen. Postsurgical changes in the cervical spine are noted. No acute bony abnormality is seen. IMPRESSION: Patchy airspace opacities likely related to the given clinical history. Electronically Signed   By: Inez Catalina M.D.   On: 11/27/2020 20:38    Assessment and plan-   #Neutropenia is likely secondary to bone marrow suppression from COVID-19 infection .  Repeat CBC today showed improvement of WBC to 2.3, ANC has improved to 1.1.  Normal B12 and normal folate. No further work-up needed at this point.  #Thrombocytopenia, platelet count has improved.  Hematology oncology will sign off at this point.  Please call if needed Thank you for allowing me to participate in the care of this patient.   Earlie Server, MD, PhD Hematology Oncology [facility] Pager- [phone] 11/30/2020

## 2020-11-30 NOTE — Progress Notes (Signed)
PROGRESS NOTE    [name]  DJS:[id number] DOB: 10/26/1931 DOA: 11/27/2020 PCP: Dion Body, MD    Brief Narrative:  [name] is a 87 y.o. female with medical history significant for interstitial lung disease, rheumatoid arthritis, T2DM, HTN, HLD, depression/anxiety who is currently active with hospice and presented to the emergency room on 5/29 with complaints of nausea.  Patient had recently tested positive for COVID 2 days prior.  She is having generalized weakness and fatigue and new diarrhea on 5/29 and family try to get her up she passed out.  EMS called.  Emergency room, COVID confirmed.  Family reports that it has been difficult to care for patient at home and they are considering skilled nursing with hospice versus hospice house.  Following admission, patient became neutropenic.  Patient put on IV Remdisivir and steroids, confirmed with family to treat COVID.  Also getting IV fluids.  Still some mild confusion, felt to be in part due to dehydration and COVID infection.  Neutropenia White count is low at 1.2.  Hematology consulted who felt [place] causing bone marrow suppression.  White count of 2.3 today.  CRP down to 2.6.  If patient is able to have some mobility, family would like to take patient home.    Consultants:  hospice  Procedures:  None  Antimicrobials:  remdesivir  Subjective: Patient still somewhat confused.  Denies any complaints  Objective: Vitals:   11/29/20 2356 11/30/20 0355 11/30/20 0910 11/30/20 1231  BP: (!) 141/76 (!) 158/88 (!) 148/58 138/74  Pulse: (!) 51 77 65 82  Resp: 14 14 17 17   Temp: 98.3 F (36.8 C) 98.6 F (37 C) 98.1 F (36.7 C)   TempSrc:   Oral   SpO2: 98% 98% 98% 96%  Weight:      Height:        Intake/Output Summary (Last 24 hours) at 11/30/2020 1707 Last data filed at 11/30/2020 0500 Gross per 24 hour  Intake 240 ml  Output 1300 ml  Net -1060 ml   Filed Weights   11/27/20 2011 11/28/20 0124   Weight: 65 kg 59.4 kg    Examination: General: Alert and oriented x2, no acute distress Cardiovascular: Regular rate and rhythm, S1-S2 Lungs: Decreased breath sounds throughout Abdomen: Soft, nontender, nondistended, positive bowel sounds Extremities: No clubbing or cyanosis or edema  Data Reviewed: I have personally reviewed following labs and imaging studies  CBC: Recent Labs  Lab 11/27/20 2017 11/28/20 0308 11/29/20 0445 11/30/20 0531  WBC 3.7* 2.8* 1.2* 2.3*  NEUTROABS  --  1.0* 0.4* 1.1*  HGB 13.6 12.3 13.1 12.6  HCT 40.9 37.8 37.6 36.8  MCV 85.9 87.1 83.4 84.2  PLT 133* 134* 134* 885*   Basic Metabolic Panel: Recent Labs  Lab 11/27/20 2017 11/28/20 0308 11/29/20 0445 11/30/20 0531  NA 132* 135 135 135  K 4.0 4.1 5.0 4.2  CL 97* 100 99 100  CO2 22 24 24 25   GLUCOSE 82 66* 158* 165*  BUN 27* 22 17 22   CREATININE 0.98 0.78 0.71 0.58  CALCIUM 9.5 9.1 9.5 9.3  MG  --  1.5* 1.7 1.7  PHOS  --  3.2 3.9 2.7   GFR: Estimated Creatinine Clearance: 41.8 mL/min (by C-G formula based on SCr of 0.58 mg/dL). Liver Function Tests: Recent Labs  Lab 11/27/20 2017 11/28/20 0308 11/29/20 0445 11/30/20 0531  AST 79* 66* 53* 41  ALT 38 33 30 27  ALKPHOS 55 50 53 51  BILITOT 0.8 0.8 0.8 0.8  PROT 8.3* 7.8 8.1 8.1  ALBUMIN 3.4* 3.4* 3.5 3.4*   Recent Labs  Lab 11/27/20 2017  LIPASE 93*   No results for input(s): AMMONIA in the last 168 hours. Coagulation Profile: No results for input(s): INR, PROTIME in the last 168 hours. Cardiac Enzymes: No results for input(s): CKTOTAL, CKMB, CKMBINDEX, TROPONINI in the last 168 hours. BNP (last 3 results) No results for input(s): PROBNP in the last 8760 hours. HbA1C: Recent Labs    11/29/20 1500  HGBA1C 6.9*   CBG: Recent Labs  Lab 11/29/20 2053 11/29/20 2355 11/30/20 0359 11/30/20 0756 11/30/20 1152  GLUCAP 137* 166* 151* 137* 239*   Lipid Profile: No results for input(s): CHOL, HDL, LDLCALC, TRIG, CHOLHDL,  LDLDIRECT in the last 72 hours. Thyroid Function Tests: No results for input(s): TSH, T4TOTAL, FREET4, T3FREE, THYROIDAB in the last 72 hours. Anemia Panel: Recent Labs    11/29/20 0445 11/30/20 0531  VITAMINB12  --  569  FOLATE  --  27.0  FERRITIN 274 231   Sepsis Labs: No results for input(s): PROCALCITON, LATICACIDVEN in the last 168 hours.  Recent Results (from the past 240 hour(s))  Resp Panel by RT-PCR (Flu A&B, Covid) Nasopharyngeal Swab     Status: Abnormal   Collection Time: 11/27/20  8:17 PM   Specimen: Nasopharyngeal Swab; Nasopharyngeal(NP) swabs in vial transport medium  Result Value Ref Range Status   SARS Coronavirus 2 by RT PCR POSITIVE (A) NEGATIVE Final    Comment: RESULT CALLED TO, READ BACK BY AND VERIFIED WITH: [place] @ 2110 11/27/20 LFD (NOTE) SARS-CoV-2 target nucleic acids are DETECTED.  The SARS-CoV-2 RNA is generally detectable in upper respiratory specimens during the acute phase of infection. Positive results are indicative of the presence of the identified virus, but do not rule out bacterial infection or co-infection with other pathogens not detected by the test. Clinical correlation with patient history and other diagnostic information is necessary to determine patient infection status. The expected result is Negative.  Fact Sheet for Patients: EntrepreneurPulse.com.au  Fact Sheet for Healthcare Providers: IncredibleEmployment.be  This test is not yet approved or cleared by the [place] FDA and  has been authorized for detection and/or diagnosis of SARS-CoV-2 by FDA under an Emergency Use Authorization (EUA).  This EUA will remain in effect (meaning this test can be  used) for the duration of  the COVID-19 declaration under Section 564(b)(1) of the Act, 21 U.S.C. section 360bbb-3(b)(1), unless the authorization is terminated or revoked sooner.     Influenza A by PCR NEGATIVE NEGATIVE Final    Influenza B by PCR NEGATIVE NEGATIVE Final    Comment: (NOTE) The Xpert Xpress SARS-CoV-2/FLU/RSV plus assay is intended as an aid in the diagnosis of influenza from Nasopharyngeal swab specimens and should not be used as a sole basis for treatment. Nasal washings and aspirates are unacceptable for Xpert Xpress SARS-CoV-2/FLU/RSV testing.  Fact Sheet for Patients: EntrepreneurPulse.com.au  Fact Sheet for Healthcare Providers: IncredibleEmployment.be  This test is not yet approved or cleared by the [place] FDA and has been authorized for detection and/or diagnosis of SARS-CoV-2 by FDA under an Emergency Use Authorization (EUA). This EUA will remain in effect (meaning this test can be used) for the duration of the COVID-19 declaration under Section 564(b)(1) of the Act, 21 U.S.C. section 360bbb-3(b)(1), unless the authorization is terminated or revoked.  Performed at [facility], [address], [place], [place] [address]  Radiology Studies: No results found.      Scheduled Meds: . vitamin C  500 mg Oral Daily  . dexamethasone  6 mg Oral Q24H  . enoxaparin (LOVENOX) injection  40 mg Subcutaneous Q24H  . insulin aspart  0-9 Units Subcutaneous Q4H  . Ipratropium-Albuterol  1 puff Inhalation Q6H  . QUEtiapine  25 mg Oral QHS  . sertraline  25 mg Oral Daily  . zinc sulfate  220 mg Oral Daily   Continuous Infusions: . remdesivir 100 mg in NS 100 mL 100 mg (11/30/20 1329)    Assessment & Plan:   Principal Problem:   COVID-19 virus infection Active Problems:   Anxiety and depression   Diabetes mellitus, type 2 ([place])   Essential (primary) hypertension   ILD (interstitial lung disease) ([place])   Arthritis or polyarthritis, rheumatoid ([facility])   Neutropenia ([facility])   Thrombocytopenia ([place])   [name] is a 87 y.o. female with medical history significant for interstitial lung disease, rheumatoid  arthritis, T2DM, HTN, HLD, depression/anxiety who is admitted with generalized weakness and gastroenteritis due to COVID-19 viral infection.  COVID-19 viral infection with gastroenteritis: Patient is dehydrated from GI losses.  CXR with mild patchy airspace opacities although this is likely largely from her known ILD. 5/31 continue with IV remdesivir and steroids  Continue inhalers  DC statins since patient on remdesivir, monitor LFTs   Generalized weakness/syncope: Due to covid infection Received IV fluids  PT OT recommend home health with 24-hour supervision     Acute metabolic encephalopathy: Due to dehydration and COVID infection Delirium precautions Still confused Treat infection as above Continue Seroquel   Interstitial lung disease: Continue with combivent and albuterol Keep 02 sat >92%  Neutropenic- wbc continues to drop.  Today 1.2 Possibly due to covid Will consult hematology    Rheumatoid arthritis: Appears to be on Humira and methotrexate as an outpatient.  Type 2 diabetes: BG stable Hold home metformin.   RISS  Hypertension: Holding home antihypertensives for now.  Hyperlipidemia: Hold statin since being tx with remdesivir   DVT prophylaxis: Lovenox Code Status: DNR Family Communication: Discussed with patient's daughter by phone  Status is: Inpatient  Remains inpatient appropriate because:Inpatient level of care appropriate due to severity of illness   Dispo: The patient is from: Home              Anticipated d/c is to: Home              Patient currently is not medically stable to d/c.   Difficult to place patient No            LOS: 2 days   Time spent: 45 min with >50% on coc    Annita Brod, MD [facility] Pager [phone]  If 7PM-7AM, please contact night-coverage 11/30/2020, 5:07 PM

## 2020-12-01 LAB — GLUCOSE, CAPILLARY
Glucose-Capillary: 124 mg/dL — ABNORMAL HIGH (ref 70–99)
Glucose-Capillary: 130 mg/dL — ABNORMAL HIGH (ref 70–99)
Glucose-Capillary: 159 mg/dL — ABNORMAL HIGH (ref 70–99)
Glucose-Capillary: 161 mg/dL — ABNORMAL HIGH (ref 70–99)
Glucose-Capillary: 164 mg/dL — ABNORMAL HIGH (ref 70–99)
Glucose-Capillary: 186 mg/dL — ABNORMAL HIGH (ref 70–99)

## 2020-12-01 LAB — CBC WITH DIFFERENTIAL/PLATELET
Abs Immature Granulocytes: 0.03 10*3/uL (ref 0.00–0.07)
Basophils Absolute: 0 10*3/uL (ref 0.0–0.1)
Basophils Relative: 0 %
Eosinophils Absolute: 0 10*3/uL (ref 0.0–0.5)
Eosinophils Relative: 0 %
HCT: 39.1 % (ref 36.0–46.0)
Hemoglobin: 13.2 g/dL (ref 12.0–15.0)
Immature Granulocytes: 1 %
Lymphocytes Relative: 34 %
Lymphs Abs: 1.4 10*3/uL (ref 0.7–4.0)
MCH: 29 pg (ref 26.0–34.0)
MCHC: 33.8 g/dL (ref 30.0–36.0)
MCV: 85.9 fL (ref 80.0–100.0)
Monocytes Absolute: 0.3 10*3/uL (ref 0.1–1.0)
Monocytes Relative: 8 %
Neutro Abs: 2.2 10*3/uL (ref 1.7–7.7)
Neutrophils Relative %: 57 %
Platelets: 150 10*3/uL (ref 150–400)
RBC: 4.55 MIL/uL (ref 3.87–5.11)
RDW: 15.3 % (ref 11.5–15.5)
Smear Review: NORMAL
WBC: 3.9 10*3/uL — ABNORMAL LOW (ref 4.0–10.5)
nRBC: 0 % (ref 0.0–0.2)

## 2020-12-01 LAB — COMPREHENSIVE METABOLIC PANEL
ALT: 27 U/L (ref 0–44)
AST: 43 U/L — ABNORMAL HIGH (ref 15–41)
Albumin: 3.3 g/dL — ABNORMAL LOW (ref 3.5–5.0)
Alkaline Phosphatase: 51 U/L (ref 38–126)
Anion gap: 6 (ref 5–15)
BUN: 25 mg/dL — ABNORMAL HIGH (ref 8–23)
CO2: 28 mmol/L (ref 22–32)
Calcium: 9.4 mg/dL (ref 8.9–10.3)
Chloride: 100 mmol/L (ref 98–111)
Creatinine, Ser: 0.79 mg/dL (ref 0.44–1.00)
GFR, Estimated: >60 mL/min (ref >60)
Glucose, Bld: 187 mg/dL — ABNORMAL HIGH (ref 70–99)
Potassium: 5.2 mmol/L — ABNORMAL HIGH (ref 3.5–5.1)
Sodium: 134 mmol/L — ABNORMAL LOW (ref 135–145)
Total Bilirubin: 0.6 mg/dL (ref 0.3–1.2)
Total Protein: 7.8 g/dL (ref 6.5–8.1)

## 2020-12-01 LAB — C-REACTIVE PROTEIN: CRP: 2.3 mg/dL — ABNORMAL HIGH (ref <1.0)

## 2020-12-01 LAB — PHOSPHORUS: Phosphorus: 2.3 mg/dL — ABNORMAL LOW (ref 2.5–4.6)

## 2020-12-01 LAB — MAGNESIUM: Magnesium: 1.7 mg/dL (ref 1.7–2.4)

## 2020-12-01 LAB — FERRITIN: Ferritin: 186 ng/mL (ref 11–307)

## 2020-12-01 LAB — D-DIMER, QUANTITATIVE: D-Dimer, Quant: >20 ug/mL-FEU — ABNORMAL HIGH (ref 0.00–0.50)

## 2020-12-01 NOTE — Progress Notes (Signed)
OT Cancellation Note  Patient Details Name: [name] MRN: [id number] DOB: [birth year]   Cancelled Treatment:    Reason Eval/Treat Not Completed: Other (comment). Chart reviewed. Pt noted with K+ 5.2. Also noting pt plan to discharge w/ hospice. Will hold OT tx at this time and re-attempt at later date/time as appropriate.   Hanley Hays, MPH, MS, OTR/L ascom [phone] 12/01/20, 2:02 PM

## 2020-12-01 NOTE — Progress Notes (Signed)
PROGRESS NOTE    [name]  JQB:[id number] DOB: August 16, 1931 DOA: 11/27/2020 PCP: Dion Body, MD    Brief Narrative:  [name] is a 87 y.o. female with medical history significant for interstitial lung disease, rheumatoid arthritis, T2DM, HTN, HLD, depression/anxiety who is currently active with hospice and presented to the emergency room on 5/29 with complaints of nausea.  Patient had recently tested positive for COVID 2 days prior.  She is having generalized weakness and fatigue and new diarrhea on 5/29 and family try to get her up she passed out.  EMS called.  Emergency room, COVID confirmed.  Family reports that it has been difficult to care for patient at home and they are considering skilled nursing with hospice versus hospice house.  Following admission, patient became neutropenic.  Patient put on IV Remdisivir and steroids, confirmed with family to treat COVID.  Also getting IV fluids.  Still some mild confusion, felt to be in part due to dehydration and COVID infection.  Neutropenia White count is low at 1.2.  Hematology consulted who felt infection from [place] causing bone marrow suppression.  White count slowly improving and today at 3.9.  CRP down to 2.3.    Consultants:  hospice  Procedures:  None  Antimicrobials:  remdesivir  Subjective: Patient still somewhat confused.  Denies any complaints  Objective: Vitals:   12/01/20 0035 12/01/20 0421 12/01/20 0824 12/01/20 1117  BP: 128/64 127/80 139/80 (!) 147/92  Pulse: (!) 57 64 (!) 57 61  Resp: 18 16 17 17   Temp: 98.2 F (36.8 C) 97.7 F (36.5 C) 97.6 F (36.4 C) 97.6 F (36.4 C)  TempSrc: Oral Oral    SpO2: 99% 100% 100% 99%  Weight:      Height:        Intake/Output Summary (Last 24 hours) at 12/01/2020 1402 Last data filed at 12/01/2020 0055 Gross per 24 hour  Intake --  Output 700 ml  Net -700 ml   Filed Weights   11/27/20 2011 11/28/20 0124  Weight: 65 kg 59.4 kg     Examination: General: Alert and oriented x2, no acute distress, little more somnolent today Cardiovascular: Regular rate and rhythm, S1-S2 Lungs: Decreased breath sounds throughout Abdomen: Soft, nontender, nondistended, positive bowel sounds Extremities: No clubbing or cyanosis or edema  Data Reviewed: I have personally reviewed following labs and imaging studies  CBC: Recent Labs  Lab 11/27/20 2017 11/28/20 0308 11/29/20 0445 11/30/20 0531 12/01/20 0334  WBC 3.7* 2.8* 1.2* 2.3* 3.9*  NEUTROABS  --  1.0* 0.4* 1.1* 2.2  HGB 13.6 12.3 13.1 12.6 13.2  HCT 40.9 37.8 37.6 36.8 39.1  MCV 85.9 87.1 83.4 84.2 85.9  PLT 133* 134* 134* 142* 409   Basic Metabolic Panel: Recent Labs  Lab 11/27/20 2017 11/28/20 0308 11/29/20 0445 11/30/20 0531 12/01/20 0334  NA 132* 135 135 135 134*  K 4.0 4.1 5.0 4.2 5.2*  CL 97* 100 99 100 100  CO2 22 24 24 25 28   GLUCOSE 82 66* 158* 165* 187*  BUN 27* 22 17 22  25*  CREATININE 0.98 0.78 0.71 0.58 0.79  CALCIUM 9.5 9.1 9.5 9.3 9.4  MG  --  1.5* 1.7 1.7 1.7  PHOS  --  3.2 3.9 2.7 2.3*   GFR: Estimated Creatinine Clearance: 41.8 mL/min (by C-G formula based on SCr of 0.79 mg/dL). Liver Function Tests: Recent Labs  Lab 11/27/20 2017 11/28/20 0308 11/29/20 0445 11/30/20 0531 12/01/20 0334  AST 79* 66* 53*  41 43*  ALT 38 33 30 27 27   ALKPHOS 55 50 53 51 51  BILITOT 0.8 0.8 0.8 0.8 0.6  PROT 8.3* 7.8 8.1 8.1 7.8  ALBUMIN 3.4* 3.4* 3.5 3.4* 3.3*   Recent Labs  Lab 11/27/20 2017  LIPASE 93*   No results for input(s): AMMONIA in the last 168 hours. Coagulation Profile: No results for input(s): INR, PROTIME in the last 168 hours. Cardiac Enzymes: No results for input(s): CKTOTAL, CKMB, CKMBINDEX, TROPONINI in the last 168 hours. BNP (last 3 results) No results for input(s): PROBNP in the last 8760 hours. HbA1C: Recent Labs    11/29/20 1500  HGBA1C 6.9*   CBG: Recent Labs  Lab 11/30/20 2047 12/01/20 0038  12/01/20 0424 12/01/20 0824 12/01/20 1118  GLUCAP 118* 161* 186* 164* 159*   Lipid Profile: No results for input(s): CHOL, HDL, LDLCALC, TRIG, CHOLHDL, LDLDIRECT in the last 72 hours. Thyroid Function Tests: No results for input(s): TSH, T4TOTAL, FREET4, T3FREE, THYROIDAB in the last 72 hours. Anemia Panel: Recent Labs    11/30/20 0531 12/01/20 0334  VITAMINB12 569  --   FOLATE 27.0  --   FERRITIN 231 186   Sepsis Labs: No results for input(s): PROCALCITON, LATICACIDVEN in the last 168 hours.  Recent Results (from the past 240 hour(s))  Resp Panel by RT-PCR (Flu A&B, Covid) Nasopharyngeal Swab     Status: Abnormal   Collection Time: 11/27/20  8:17 PM   Specimen: Nasopharyngeal Swab; Nasopharyngeal(NP) swabs in vial transport medium  Result Value Ref Range Status   SARS Coronavirus 2 by RT PCR POSITIVE (A) NEGATIVE Final    Comment: RESULT CALLED TO, READ BACK BY AND VERIFIED WITH: Trainer @ 2110 11/27/20 LFD (NOTE) SARS-CoV-2 target nucleic acids are DETECTED.  The SARS-CoV-2 RNA is generally detectable in upper respiratory specimens during the acute phase of infection. Positive results are indicative of the presence of the identified virus, but do not rule out bacterial infection or co-infection with other pathogens not detected by the test. Clinical correlation with patient history and other diagnostic information is necessary to determine patient infection status. The expected result is Negative.  Fact Sheet for Patients: EntrepreneurPulse.com.au  Fact Sheet for Healthcare Providers: IncredibleEmployment.be  This test is not yet approved or cleared by the [place] FDA and  has been authorized for detection and/or diagnosis of SARS-CoV-2 by FDA under an Emergency Use Authorization (EUA).  This EUA will remain in effect (meaning this test can be  used) for the duration of  the COVID-19 declaration under Section  564(b)(1) of the Act, 21 U.S.C. section 360bbb-3(b)(1), unless the authorization is terminated or revoked sooner.     Influenza A by PCR NEGATIVE NEGATIVE Final   Influenza B by PCR NEGATIVE NEGATIVE Final    Comment: (NOTE) The Xpert Xpress SARS-CoV-2/FLU/RSV plus assay is intended as an aid in the diagnosis of influenza from Nasopharyngeal swab specimens and should not be used as a sole basis for treatment. Nasal washings and aspirates are unacceptable for Xpert Xpress SARS-CoV-2/FLU/RSV testing.  Fact Sheet for Patients: EntrepreneurPulse.com.au  Fact Sheet for Healthcare Providers: IncredibleEmployment.be  This test is not yet approved or cleared by the [place] FDA and has been authorized for detection and/or diagnosis of SARS-CoV-2 by FDA under an Emergency Use Authorization (EUA). This EUA will remain in effect (meaning this test can be used) for the duration of the COVID-19 declaration under Section 564(b)(1) of the Act, 21 U.S.C. section 360bbb-3(b)(1), unless the authorization  is terminated or revoked.  Performed at [facility], [address], [place], [place] [address]          Radiology Studies: No results found.      Scheduled Meds: . vitamin C  500 mg Oral Daily  . dexamethasone  6 mg Oral Q24H  . enoxaparin (LOVENOX) injection  40 mg Subcutaneous Q24H  . insulin aspart  0-9 Units Subcutaneous Q4H  . Ipratropium-Albuterol  1 puff Inhalation Q6H  . sertraline  25 mg Oral Daily  . zinc sulfate  220 mg Oral Daily   Continuous Infusions: . remdesivir 100 mg in NS 100 mL 100 mg (12/01/20 1219)    Assessment & Plan:   Principal Problem:   COVID-19 virus infection Active Problems:   Anxiety and depression   Diabetes mellitus, type 2 ([place])   Essential (primary) hypertension   ILD (interstitial lung disease) ([place])   Arthritis or polyarthritis, rheumatoid (HCC)   Neutropenia (HCC)    Thrombocytopenia ([place])   Sheleen L Luckey is a 87 y.o. female with medical history significant for interstitial lung disease, rheumatoid arthritis, T2DM, HTN, HLD, depression/anxiety who is admitted with generalized weakness and gastroenteritis due to COVID-19 viral infection.  COVID-19 viral infection with gastroenteritis: Patient is dehydrated from GI losses.  CXR with mild patchy airspace opacities although this is likely largely from her known ILD. 5/31 continue with IV remdesivir and steroids  Continue inhalers  DC statins since patient on remdesivir, LFTs stable   Generalized weakness/syncope: Due to covid infection Received IV fluids  PT OT recommend home health with 24-hour supervision     Acute metabolic encephalopathy: Due to dehydration and COVID infection Delirium precautions Still confused Treat infection as above Starting Seroquel at family request   Interstitial lung disease: Continue with combivent and albuterol Keep 02 sat >92%  Neutropenic-White count has since improved.  This was due to [place].  Today at 3.9    Rheumatoid arthritis: Appears to be on Humira and methotrexate as an outpatient.  Type 2 diabetes: BG stable Hold home metformin.   RISS  Hypertension: Holding home antihypertensives for now.  Hyperlipidemia: Hold statin since being tx with remdesivir   DVT prophylaxis: Lovenox Code Status: DNR Family Communication: Discussed with patient's daughter by phone  Status is: Inpatient  Remains inpatient appropriate because:Inpatient level of care appropriate due to severity of illness  Anticipate discharge in next few days.  Patient go home with home hospice.  Currently entire family is ill with COVID and recovering.  Anticipate discharge in 2 days. Dispo: The patient is from: Home              Anticipated d/c is to: Home              Patient currently is not medically stable to d/c.   Difficult to place patient No             LOS: 3 days   Time spent: 45 min with >50% on coc    Annita Brod, MD [facility] Pager [phone]  If 7PM-7AM, please contact night-coverage 12/01/2020, 2:02 PM

## 2020-12-01 NOTE — TOC Progression Note (Addendum)
Transition of Care [facility]) - Progression Note    Patient Details  Name: [name] MRN: [id number] Date of Birth: Jan 31, 1932  Transition of Care  [facility]) CM/SW [place], RN Phone Number: 12/01/2020, 10:04 AM  Clinical Narrative:   Spoke with daughter Amy, states they plan to bring patient home with hospice.    Patient's daughter states entire family has COVID and symptomatic.  TOC contact information given.  TOC to follow through discharge.    Expected Discharge Plan: [place] Barriers to Discharge: Continued Medical Work up  Expected Discharge Plan and Services Expected Discharge Plan: [place] In-house Referral: Hospice / Palliative Care Discharge Planning Services: CM Consult   Living arrangements for the past 2 months: Single Family Home                 DME Arranged: N/A DME Agency: NA       HH Arranged: NA           Social Determinants of Health (SDOH) Interventions    Readmission Risk Interventions No flowsheet data found.

## 2020-12-01 NOTE — Plan of Care (Signed)
Discussed with patient plan of care for the evening, pain management and clustering care with COVID+ status with some teach back displayed.  Stated to patient I would be in the room more than there NT tonight and re-oriented to use of call bell as well.  Problem: Education: Goal: Knowledge of General Education information will improve Description: Including pain rating scale, medication(s)/side effects and non-pharmacologic comfort measures Outcome: Progressing

## 2020-12-01 NOTE — Progress Notes (Addendum)
[facility]) Hospitalized Hospice Patient Note  [name] is a current Hospice patient with a terminal diagnosis of abnormal weight loss, rheumatoid arthritis, interstitial pulmonary disease and vascular dementia. She, along with her family living in the home, have all been diagnosed with Covid 19. On day of admission (11/28/20), she began having bouts of diarrhea and became so weak she was unable to help care for herself. Her daughter, who was also sick, was unable to provide care for her and the decision was made to send her to the ED. She is admitted with a diagnosis of Covid-19, this is a related admission per Dr. Eulas Post with [facility].   Unable to visit at the bedside due to covid-19 restrictions. Report exchanged. Patient has received Ativan for anxiety/agitation. Progress note states that patient may benefit from home health with PT/OT. Spoke with daughter [name] and provided an update. Discussed home health with [name]. Notified that she certainly may except home health services, but explained that hospice and home health services cannot be received at the same time. Informed [name] that she has the choice to revoke hospice services if she wishes for her mother to receive home health services. [name] states she desires to keep hospice services for her mother at this time. [name] is concerned that when [name]'s granddaughter visits, that [name] appears to be laying in urine. Explained that if the external catheter moves, it may not be suctioning properly. [name] also requests that seroquel be discontinued. She states that in the past [name] was on seroquel and it caused her to have bad dreams. Notified attending MD.    She remains inpatient appropriate due to treatment of covid-19.   V/S: 97.6 oral, 147/92, HR 61, RR 17, SPO2 99% on O2 @ 2 lpm I&O: 100/700 Labs: CRP 2.3, ferritin 186, WBC 3.9, plt 150, d-dimer > 20, Albumin 3.3, BUN 25, Potassium 5.2,  Sodium 134 Diagnostics: none IVs/PRNs: remdesivir 100 mg IV QD  Problem List: COVID-19 viral infection with gastroenteritis: Patient is dehydrated from GI losses. CXR with mild patchy airspace opacities although this is likely largely from her known ILD. -Start IV remdesivir -Oral Decadron -IV fluid hydration overnight -Combivent with albuterol as needed 5/31 continue with IV remdesivir and steroids  Continue inhalers  DC statins since patient on remdesivir, monitor LFTs  Neutropenia: -hematology evaluated, feels likely due to bone marrow suppression secondary to covid 19 infection -WBCs 3.7 > 2.8 > 1.2 > 2.8 today   Generalized weakness/syncope: Due to covid infection Received IV fluids    Acute metabolic encephalopathy: Due to dehydration/COVID-19 viral infection.  Delirium precautions. Still confused  Treat infection as above  Continue Seroquel  Interstitial lung disease: Continue Combivent and albuterol as needed as above.  Supplemental O2 as needed. Currently on 2 lpm, down from 4 lpm  Active hospice patient: Patient is active with hospice.  CODE STATUS is DNR.  Discussed with family, they are agreeable with current management in hospital.  [id number], family wants patient comfortable but is unsure if they will be able to take her back home. [name] is able to manage her as long as she has some mobility, which she had prior to covid-19.  .  D/C planning:Ongoing. May need additional DME in the home (hospital bed) if patient is not more mobile at the time of d/c.  Family contact: Spoke with daughter [name] by telephone, provided update and support.   [id number] team updated.  Please call if you  have any questions.   Thank you,   Loren Racer, RN, BSN [facility] Liaison [phone]

## 2020-12-01 NOTE — Progress Notes (Signed)
PT Cancellation Note  Patient Details Name: [name] MRN: [id number] DOB: March 23, 1932   Cancelled Treatment:     Pt had just started Remdesivir, will try to return if time permits.  Once pt medically stable, pt will d/c home to daughter's on Hospice.   Josie Dixon 12/01/2020, 12:27 PM

## 2020-12-02 DIAGNOSIS — M0579 Rheumatoid arthritis with rheumatoid factor of multiple sites without organ or systems involvement: Secondary | ICD-10-CM

## 2020-12-02 DIAGNOSIS — I10 Essential (primary) hypertension: Secondary | ICD-10-CM

## 2020-12-02 DIAGNOSIS — J849 Interstitial pulmonary disease, unspecified: Secondary | ICD-10-CM

## 2020-12-02 LAB — CBC WITH DIFFERENTIAL/PLATELET
Abs Immature Granulocytes: 0.03 10*3/uL (ref 0.00–0.07)
Basophils Absolute: 0 10*3/uL (ref 0.0–0.1)
Basophils Relative: 0 %
Eosinophils Absolute: 0 10*3/uL (ref 0.0–0.5)
Eosinophils Relative: 0 %
HCT: 38.5 % (ref 36.0–46.0)
Hemoglobin: 12.6 g/dL (ref 12.0–15.0)
Immature Granulocytes: 1 %
Lymphocytes Relative: 38 %
Lymphs Abs: 1.5 10*3/uL (ref 0.7–4.0)
MCH: 27.9 pg (ref 26.0–34.0)
MCHC: 32.7 g/dL (ref 30.0–36.0)
MCV: 85.2 fL (ref 80.0–100.0)
Monocytes Absolute: 0.3 10*3/uL (ref 0.1–1.0)
Monocytes Relative: 9 %
Neutro Abs: 2 10*3/uL (ref 1.7–7.7)
Neutrophils Relative %: 52 %
Platelets: 134 10*3/uL — ABNORMAL LOW (ref 150–400)
RBC: 4.52 MIL/uL (ref 3.87–5.11)
RDW: 15.3 % (ref 11.5–15.5)
WBC: 3.8 10*3/uL — ABNORMAL LOW (ref 4.0–10.5)
nRBC: 0 % (ref 0.0–0.2)

## 2020-12-02 LAB — COMPREHENSIVE METABOLIC PANEL
ALT: 24 U/L (ref 0–44)
AST: 40 U/L (ref 15–41)
Albumin: 3.3 g/dL — ABNORMAL LOW (ref 3.5–5.0)
Alkaline Phosphatase: 57 U/L (ref 38–126)
Anion gap: 8 (ref 5–15)
BUN: 25 mg/dL — ABNORMAL HIGH (ref 8–23)
CO2: 25 mmol/L (ref 22–32)
Calcium: 8.8 mg/dL — ABNORMAL LOW (ref 8.9–10.3)
Chloride: 100 mmol/L (ref 98–111)
Creatinine, Ser: 0.69 mg/dL (ref 0.44–1.00)
GFR, Estimated: >60 mL/min (ref >60)
Glucose, Bld: 204 mg/dL — ABNORMAL HIGH (ref 70–99)
Potassium: 3.9 mmol/L (ref 3.5–5.1)
Sodium: 133 mmol/L — ABNORMAL LOW (ref 135–145)
Total Bilirubin: 0.7 mg/dL (ref 0.3–1.2)
Total Protein: 7.6 g/dL (ref 6.5–8.1)

## 2020-12-02 LAB — D-DIMER, QUANTITATIVE: D-Dimer, Quant: >20 ug/mL-FEU — ABNORMAL HIGH (ref 0.00–0.50)

## 2020-12-02 LAB — GLUCOSE, CAPILLARY
Glucose-Capillary: 164 mg/dL — ABNORMAL HIGH (ref 70–99)
Glucose-Capillary: 191 mg/dL — ABNORMAL HIGH (ref 70–99)
Glucose-Capillary: 159 mg/dL — ABNORMAL HIGH (ref 70–99)
Glucose-Capillary: 161 mg/dL — ABNORMAL HIGH (ref 70–99)
Glucose-Capillary: 126 mg/dL — ABNORMAL HIGH (ref 70–99)
Glucose-Capillary: 171 mg/dL — ABNORMAL HIGH (ref 70–99)

## 2020-12-02 LAB — C-REACTIVE PROTEIN: CRP: 1.9 mg/dL — ABNORMAL HIGH (ref <1.0)

## 2020-12-02 LAB — PHOSPHORUS: Phosphorus: 2 mg/dL — ABNORMAL LOW (ref 2.5–4.6)

## 2020-12-02 LAB — MAGNESIUM: Magnesium: 1.8 mg/dL (ref 1.7–2.4)

## 2020-12-02 LAB — FERRITIN: Ferritin: 131 ng/mL (ref 11–307)

## 2020-12-02 MED ORDER — K PHOS MONO-SOD PHOS DI & MONO 155-852-130 MG PO TABS
250.0000 mg | ORAL_TABLET | Freq: Three times a day (TID) | ORAL | Status: AC
  Administered 2020-12-02 – 2020-12-03 (×3): 250 mg via ORAL
  Filled 2020-12-02 (×3): qty 1

## 2020-12-02 NOTE — Progress Notes (Signed)
Physical Therapy Treatment Patient Details Name: [name] MRN: [id number] DOB: 01-26-1932 Today's Date: 12/02/2020    History of Present Illness presented to ER secondary to nausea, diarrhea, syncopal episode; admitted for management of covid-19 with viral gastroenteritis.    PT Comments    Able to initiate increased gait training/distance this date; demonstrates forward flexed posture, maintaining RW arms-length ahead of patient; min assist for walker position and management.  Inconsistent step height/length, limited balance reactions; but noted improvement in performance and overall activity tolerance since admission.  Sats >93% on RA throughout gait trial; left on RA end of session.  RN informed/aware.     Follow Up Recommendations  Home health PT;Supervision/Assistance - 24 hour     Equipment Recommendations  Rolling walker with 5" wheels;3in1 (PT)    Recommendations for Other Services       Precautions / Restrictions Precautions Precautions: Fall Restrictions Weight Bearing Restrictions: No    Mobility  Bed Mobility Overal bed mobility: Needs Assistance Bed Mobility: Supine to Sit     Supine to sit: Min assist          Transfers Overall transfer level: Needs assistance Equipment used: Rolling walker (2 wheeled) Transfers: Sit to/from Stand Sit to Stand: Min assist         General transfer comment: cuing for hand placement with movement transitions  Ambulation/Gait Ambulation/Gait assistance: Min assist Gait Distance (Feet):  (25' x2) Assistive device: Rolling walker (2 wheeled)       General Gait Details: forward flexed posture, maintaining RW arms-length ahead of patient; min assist for walker position and management.  Inconsistent step height/length, limited balance reactions; but noted improvement in performance and overall activity tolerance since admission.  Sats >93% on RA throughout gait trial   Stairs             Wheelchair  Mobility    Modified Rankin (Stroke Patients Only)       Balance Overall balance assessment: Needs assistance Sitting-balance support: No upper extremity supported;Feet supported Sitting balance-Leahy Scale: Good     Standing balance support: Bilateral upper extremity supported Standing balance-Leahy Scale: Fair                              Cognition Arousal/Alertness: Awake/alert Behavior During Therapy: WFL for tasks assessed/performed Overall Cognitive Status: No family/caregiver present to determine baseline cognitive functioning                                 General Comments: follows simple commands, improved overall alertness, cognition and overall participation with session      Exercises Other Exercises Other Exercises: Sit/stand from various surface heights (edge of bed, recliner, standard toilet), cga/min assist.  Tends to pull on RW; limited integration of cuing between reps Other Exercises: Toilet transfer, ambulatory with RW, min assist; sit/stand from standard toilet, min assist; standing balance at sink for hand hygiene, min assist.  Very limited functional reach, requiring unilateral UE on counter to stabilize self at all times (or to extend reach as needed)    General Comments        Pertinent Vitals/Pain Pain Assessment: No/denies pain    Home Living                      Prior Function  PT Goals (current goals can now be found in the care plan section) Acute Rehab PT Goals Patient Stated Goal: to go home PT Goal Formulation: With patient Time For Goal Achievement: 12/12/20 Potential to Achieve Goals: Good Progress towards PT goals: Progressing toward goals    Frequency    Min 2X/week      PT Plan Current plan remains appropriate    Co-evaluation              AM-PAC PT "6 Clicks" Mobility   Outcome Measure  Help needed turning from your back to your side while in a flat bed without  using bedrails?: None Help needed moving from lying on your back to sitting on the side of a flat bed without using bedrails?: A Little Help needed moving to and from a bed to a chair (including a wheelchair)?: A Little Help needed standing up from a chair using your arms (e.g., wheelchair or bedside chair)?: A Little Help needed to walk in hospital room?: A Little Help needed climbing 3-5 steps with a railing? : A Little 6 Click Score: 19    End of Session Equipment Utilized During Treatment: Gait belt Activity Tolerance: Patient tolerated treatment well Patient left: in chair;with call bell/phone within reach;with chair alarm set Nurse Communication: Mobility status PT Visit Diagnosis: Muscle weakness (generalized) (M62.81);Difficulty in walking, not elsewhere classified (R26.2)     Time: 4680-3212 PT Time Calculation (min) (ACUTE ONLY): 25 min  Charges:  $Gait Training: 8-22 mins $Therapeutic Activity: 8-22 mins                     Merrissa Giacobbe H. Owens Shark, PT, DPT, NCS 12/02/20, 3:13 PM [phone]

## 2020-12-02 NOTE — Progress Notes (Addendum)
[facility] Patient Note  Ms. [name] is a current Hospice patient with a terminal diagnosis of abnormal weight loss,rheumatoidarthritis, interstitial pulmonary disease and vascular dementia. She,along with her family living in the [place] all been diagnosed with Covid 19. On day of admission (11/28/20), she began having bouts of diarrhea and became so weak she was unable to help care for herself. Her daughter,who was also sick,was unable to provide care for her and the decision was made to send her to the ED. She is admitted with a diagnosis of Covid-19, this is a related admission per Dr. Eulas Post with [facility].   Unable to visit at the bedside due to covid-19 restrictions. Report exchanged. Patient has continued to receive Ativan for anxiety/agitation. Spoke with patient's daughter, [name] over the phone. She reports that she thought the plan was to discharge her mother home on 12/05/20. Notified [name] that the plan is to discharge her mother home on 12/03/20 after IV remdesivir is complete. [name] is in agreement with plan. Discussed original thought of having a hospital bed delivered prior to patient discharging home. [name] states she will not be able to have current bed removed before patient returns home and therefore will decide if she does want a hospital bed at a later time. [name] states that there is oxygen in the home available for patient.   She remains inpatient appropriate due to treatment of covid-19.   V/S:97.7 oral, 131/73, HR 58, RR 15, SPO2 99% on O2 @ 2 lpm I&O:310/400 Labs:CRP 1.9, ferritin 186, WBC 3.8, plt 134, d-dimer >20, Albumin 3.3, BUN 25, Potassium 5.2, Sodium 133, Calcium 8.8 Diagnostics:none IVs/PRNs: remdesivir 100 mg IV QD, Ativan 0.5mg  TID PRN  Problem List: COVID-19 viral infection with gastroenteritis: Patient is dehydrated from GI losses. CXR with mild patchy airspace opacities although this is likely  largely from her known ILD. 5/31 continue with IV remdesivir and steroids  Continue inhalers  DC statins since patient on remdesivir, LFTs stable   Generalized weakness/syncope: Due to covid infection Received IV fluids  PT OT recommend home health with 24-hour supervision     Acute metabolic encephalopathy: Due to dehydration and COVID infection Delirium precautions Still confused Treat infection as above Starting Seroquel at family request   Interstitial lung disease: Continue with combivent and albuterol Keep 02 sat >92%  Neutropenic-White count has since improved.  This was due to [place].  Today at 3.9    Rheumatoid arthritis: Appears to be on Humira and methotrexate as an outpatient.  Type 2 diabetes: BG stable Hold home metformin.  RISS  Hypertension: Holding home antihypertensives for now.  Hyperlipidemia: Hold statin since being tx with remdesivir  Active hospice patient: Patient is active with hospice.  CODE STATUS is DNR.  Discussed with family, they are agreeable with current management in hospital.  [id number], family wants patient comfortable. [name] in agreement to patient discharging home on 12/03/20. .  D/C planning:Ongoing. Plan is for patient to discharge on 12/03/20. [name] declines a hospital bed at this time.   Family contact: Spoke with daughter [name] by telephone, provided update and support.  [id number] team updated.  Please call if you have any questions.   Thank you,   Bobbie "Loren Racer, [place], BSN [facility] Liaison [phone]

## 2020-12-02 NOTE — TOC Progression Note (Signed)
Transition of Care [facility]) - Progression Note    Patient Details  Name: [name] MRN: [id number] Date of Birth: [birth year]  Transition of Care [facility]) CM/SW Contact  Shelbie Hutching, RN Phone Number: 12/02/2020, 3:35 PM  Clinical Narrative:    Plan for discharge home tomorrow with [facility].  RNCM notified by [facility] liaison that family does not need a hospital bed at home.  PT has worked with patient and patient is getting up and able to walk bath and forth to the restroom and has been sitting up in the chair.     Expected Discharge Plan: Home w Hospice Care Barriers to Discharge: Continued Medical Work up  Expected Discharge Plan and Services Expected Discharge Plan: [place] In-house Referral: Hospice / Palliative Care Discharge Planning Services: CM Consult   Living arrangements for the past 2 months: Single Family Home                 DME Arranged: N/A DME Agency: NA       HH Arranged: NA           Social Determinants of Health (SDOH) Interventions    Readmission Risk Interventions No flowsheet data found.

## 2020-12-02 NOTE — Progress Notes (Signed)
PROGRESS NOTE    [name]  XIP:[id number] DOB: [birth year] DOA: 11/27/2020 PCP: Dion Body, MD    Brief Narrative:  [name] is a 87 y.o. female with medical history significant for interstitial lung disease, rheumatoid arthritis, T2DM, HTN, HLD, depression/anxiety who is currently active with hospice and presented to the emergency room on 5/29 with complaints of nausea.  Patient had recently tested positive for COVID 2 days prior.  She is having generalized weakness and fatigue and new diarrhea on 5/29 and family try to get her up she passed out.  EMS called.  Emergency room, COVID confirmed.  Family reports that it has been difficult to care for patient at home and they are considering skilled nursing with hospice versus hospice house.  Following admission, patient became neutropenic.  Patient put on IV Remdisivir and steroids, confirmed with family to treat COVID.  Also getting IV fluids.  Still some mild confusion, felt to be in part due to dehydration and COVID infection.  Neutropenia White count is low at 1.2.  Hematology consulted who felt infection from [place] causing bone marrow suppression.  White count improved and staying stable.  CRP continues to near normal  Consultants:  hospice  Procedures:  None  Antimicrobials:  remdesivir  Subjective: Patient with no complaints.  Still a little confused.  Objective: Vitals:   12/02/20 0412 12/02/20 0823 12/02/20 1127 12/02/20 1543  BP: 139/77 (!) 144/82 131/73 136/79  Pulse: (!) 58 (!) 56 (!) 58 68  Resp: 16 16 15 16   Temp: 98.2 F (36.8 C) 97.8 F (36.6 C) 97.7 F (36.5 C) 97.8 F (36.6 C)  TempSrc:  Oral Oral Oral  SpO2: 99% 99% 99% 98%  Weight:      Height:        Intake/Output Summary (Last 24 hours) at 12/02/2020 1558 Last data filed at 12/02/2020 0400 Gross per 24 hour  Intake 310 ml  Output 400 ml  Net -90 ml   Filed Weights   11/27/20 2011 11/28/20 0124  Weight: 65 kg 59.4 kg     Examination: General: Alert and oriented x2, no acute distress, Cardiovascular: Regular rate and rhythm, S1-S2 Lungs: Decreased breath sounds throughout Abdomen: Soft, nontender, nondistended, positive bowel sounds Extremities: No clubbing or cyanosis or edema  Data Reviewed: I have personally reviewed following labs and imaging studies  CBC: Recent Labs  Lab 11/28/20 0308 11/29/20 0445 11/30/20 0531 12/01/20 0334 12/02/20 0356  WBC 2.8* 1.2* 2.3* 3.9* 3.8*  NEUTROABS 1.0* 0.4* 1.1* 2.2 2.0  HGB 12.3 13.1 12.6 13.2 12.6  HCT 37.8 37.6 36.8 39.1 38.5  MCV 87.1 83.4 84.2 85.9 85.2  PLT 134* 134* 142* 150 673*   Basic Metabolic Panel: Recent Labs  Lab 11/28/20 0308 11/29/20 0445 11/30/20 0531 12/01/20 0334 12/02/20 0356  NA 135 135 135 134* 133*  K 4.1 5.0 4.2 5.2* 3.9  CL 100 99 100 100 100  CO2 24 24 25 28 25   GLUCOSE 66* 158* 165* 187* 204*  BUN 22 17 22  25* 25*  CREATININE 0.78 0.71 0.58 0.79 0.69  CALCIUM 9.1 9.5 9.3 9.4 8.8*  MG 1.5* 1.7 1.7 1.7 1.8  PHOS 3.2 3.9 2.7 2.3* 2.0*   GFR: Estimated Creatinine Clearance: 41.8 mL/min (by C-G formula based on SCr of 0.69 mg/dL). Liver Function Tests: Recent Labs  Lab 11/28/20 0308 11/29/20 0445 11/30/20 0531 12/01/20 0334 12/02/20 0356  AST 66* 53* 41 43* 40  ALT 33 30 27 27  24  ALKPHOS 50 53 51 51 57  BILITOT 0.8 0.8 0.8 0.6 0.7  PROT 7.8 8.1 8.1 7.8 7.6  ALBUMIN 3.4* 3.5 3.4* 3.3* 3.3*   Recent Labs  Lab 11/27/20 2017  LIPASE 93*   No results for input(s): AMMONIA in the last 168 hours. Coagulation Profile: No results for input(s): INR, PROTIME in the last 168 hours. Cardiac Enzymes: No results for input(s): CKTOTAL, CKMB, CKMBINDEX, TROPONINI in the last 168 hours. BNP (last 3 results) No results for input(s): PROBNP in the last 8760 hours. HbA1C: No results for input(s): HGBA1C in the last 72 hours. CBG: Recent Labs  Lab 12/02/20 0130 12/02/20 0411 12/02/20 0824 12/02/20 1127  12/02/20 1537  GLUCAP 164* 191* 159* 161* 126*   Lipid Profile: No results for input(s): CHOL, HDL, LDLCALC, TRIG, CHOLHDL, LDLDIRECT in the last 72 hours. Thyroid Function Tests: No results for input(s): TSH, T4TOTAL, FREET4, T3FREE, THYROIDAB in the last 72 hours. Anemia Panel: Recent Labs    11/30/20 0531 12/01/20 0334 12/02/20 0356  VITAMINB12 569  --   --   FOLATE 27.0  --   --   FERRITIN 231 186 131   Sepsis Labs: No results for input(s): PROCALCITON, LATICACIDVEN in the last 168 hours.  Recent Results (from the past 240 hour(s))  Resp Panel by RT-PCR (Flu A&B, Covid) Nasopharyngeal Swab     Status: Abnormal   Collection Time: 11/27/20  8:17 PM   Specimen: Nasopharyngeal Swab; Nasopharyngeal(NP) swabs in vial transport medium  Result Value Ref Range Status   SARS Coronavirus 2 by RT PCR POSITIVE (A) NEGATIVE Final    Comment: RESULT CALLED TO, READ BACK BY AND VERIFIED WITH: [place] @ 2110 11/27/20 LFD (NOTE) SARS-CoV-2 target nucleic acids are DETECTED.  The SARS-CoV-2 RNA is generally detectable in upper respiratory specimens during the acute phase of infection. Positive results are indicative of the presence of the identified virus, but do not rule out bacterial infection or co-infection with other pathogens not detected by the test. Clinical correlation with patient history and other diagnostic information is necessary to determine patient infection status. The expected result is Negative.  Fact Sheet for Patients: EntrepreneurPulse.com.au  Fact Sheet for Healthcare Providers: IncredibleEmployment.be  This test is not yet approved or cleared by the [place] FDA and  has been authorized for detection and/or diagnosis of SARS-CoV-2 by FDA under an Emergency Use Authorization (EUA).  This EUA will remain in effect (meaning this test can be  used) for the duration of  the COVID-19 declaration under Section 564(b)(1)  of the Act, 21 U.S.C. section 360bbb-3(b)(1), unless the authorization is terminated or revoked sooner.     Influenza A by PCR NEGATIVE NEGATIVE Final   Influenza B by PCR NEGATIVE NEGATIVE Final    Comment: (NOTE) The Xpert Xpress SARS-CoV-2/FLU/RSV plus assay is intended as an aid in the diagnosis of influenza from Nasopharyngeal swab specimens and should not be used as a sole basis for treatment. Nasal washings and aspirates are unacceptable for Xpert Xpress SARS-CoV-2/FLU/RSV testing.  Fact Sheet for Patients: EntrepreneurPulse.com.au  Fact Sheet for Healthcare Providers: IncredibleEmployment.be  This test is not yet approved or cleared by the [place] FDA and has been authorized for detection and/or diagnosis of SARS-CoV-2 by FDA under an Emergency Use Authorization (EUA). This EUA will remain in effect (meaning this test can be used) for the duration of the COVID-19 declaration under Section 564(b)(1) of the Act, 21 U.S.C. section 360bbb-3(b)(1), unless the authorization is terminated or  revoked.  Performed at [facility], [address], [place], [place] [address]          Radiology Studies: No results found.      Scheduled Meds: . vitamin C  500 mg Oral Daily  . dexamethasone  6 mg Oral Q24H  . enoxaparin (LOVENOX) injection  40 mg Subcutaneous Q24H  . insulin aspart  0-9 Units Subcutaneous Q4H  . Ipratropium-Albuterol  1 puff Inhalation Q6H  . phosphorus  250 mg Oral Q8H  . sertraline  25 mg Oral Daily  . zinc sulfate  220 mg Oral Daily   Continuous Infusions:   Assessment & Plan:   Principal Problem:   COVID-19 virus infection Active Problems:   Anxiety and depression   Diabetes mellitus, type 2 (HCC)   Essential (primary) hypertension   ILD (interstitial lung disease) ([place])   Arthritis or polyarthritis, rheumatoid (HCC)   Neutropenia (HCC)   Thrombocytopenia (HCC)   [name] is  a 87 y.o. female with medical history significant for interstitial lung disease, rheumatoid arthritis, T2DM, HTN, HLD, depression/anxiety who is admitted with generalized weakness and gastroenteritis due to COVID-19 viral infection.  COVID-19 viral infection with gastroenteritis: Patient is dehydrated from GI losses.  CXR with mild patchy airspace opacities although this is likely largely from her known ILD. 5/31 continue with IV remdesivir and steroids  Continue inhalers  DC statins since patient on remdesivir, LFTs stable   Generalized weakness/syncope: Due to covid infection Received IV fluids  PT OT recommend home health with 24-hour supervision     Acute metabolic encephalopathy: Due to dehydration and COVID infection Delirium precautions Still confused Treat infection as above Starting Seroquel at family request   Interstitial lung disease: Continue with combivent and albuterol Keep 02 sat >92%  Neutropenic-White count has since improved.  This was due to [place].  Today at 3.9    Rheumatoid arthritis: Appears to be on Humira and methotrexate as an outpatient.  Type 2 diabetes: BG stable Hold home metformin.   RISS  Hypertension: Holding home antihypertensives for now.  Hyperlipidemia: Hold statin since being tx with remdesivir   DVT prophylaxis: Lovenox Code Status: DNR Family Communication: Discussed with patient's daughter by phone  Status is: Inpatient  Remains inpatient appropriate because:Inpatient level of care appropriate due to severity of illness  Anticipate discharge in next few days.  Patient go home with home hospice.  Currently entire family is ill with COVID and recovering.  Anticipate discharge in 2 days. Dispo: The patient is from: Home              Anticipated d/c is to: Home              Patient currently is not medically stable to d/c.   Difficult to place patient No            LOS: 4 days   Time spent: 45 min  with >50% on coc    Annita Brod, MD [facility] Pager [phone]  If 7PM-7AM, please contact night-coverage 12/02/2020, 3:58 PM

## 2020-12-02 NOTE — Progress Notes (Signed)
Occupational Therapy Treatment Patient Details Name: [name] MRN: [id number] DOB: 02-16-1932 Today's Date: 12/02/2020    History of present illness presented to ER secondary to nausea, diarrhea, syncopal episode; admitted for management of covid-19 with viral gastroenteritis.   OT comments  Pt seen for OT tx this date to f/u re: safety with ADLs/ADL mobility. Pt requests to use commode and OT provides MIN A for pt to SPS from EOB to commode with hand held assistance. Pt requires SETUP to CGA for seated peri care. Pt then assisted with STS t/f with hand held assist and takes 2-3 small steps from commode to chair. Pt in recliner with chair alarm set, seated upright. Pt generally with better cue following this date. OT engages pt in hand washign with SETUP and MIN cues to sequence. Pt assisted with tray SETUP. Left with all needs met and in reach. RN notified of session contents. Will continue to follow.    Follow Up Recommendations  Home health OT;Supervision/Assistance - 24 hour    Equipment Recommendations  3 in 1 bedside commode;Tub/shower seat;Other (comment) (2ww)    Recommendations for Other Services      Precautions / Restrictions Precautions Precautions: Fall Restrictions Weight Bearing Restrictions: No       Mobility Bed Mobility Overal bed mobility: Needs Assistance Bed Mobility: Supine to Sit     Supine to sit: Min assist     General bed mobility comments: increased time    Transfers Overall transfer level: Needs assistance Equipment used: 1 person hand held assist Transfers: Sit to/from Stand;Stand Pivot Transfers Sit to Stand: Min assist Stand pivot transfers: Min assist       General transfer comment: to/from commode, then to recliner    Balance Overall balance assessment: Needs assistance Sitting-balance support: No upper extremity supported;Feet supported Sitting balance-Leahy Scale: Good     Standing balance support: Single extremity  supported;Bilateral upper extremity supported Standing balance-Leahy Scale: Fair                             ADL either performed or assessed with clinical judgement   ADL Overall ADL's : Needs assistance/impaired     Grooming: Wash/dry hands;Set up;Sitting                   Toilet Transfer: Minimal assistance;Min guard;Stand-pivot;BSC Toilet Transfer Details (indicate cue type and reason): cues for safety Toileting- Clothing Manipulation and Hygiene: Min guard;Sitting/lateral lean Toileting - Clothing Manipulation Details (indicate cue type and reason): for anterior peri care             Vision Patient Visual Report: No change from baseline Additional Comments: difficult to formally assess d/t cognition   Perception     Praxis      Cognition Arousal/Alertness: Awake/alert Behavior During Therapy: WFL for tasks assessed/performed Overall Cognitive Status: No family/caregiver present to determine baseline cognitive functioning                                 General Comments: improved command following this date        Exercises Other Exercises Other Exercises: OT engages pt in toileting and then t/f to recliner to sit up to eat dinner. OT provides tray setup and cues adn engage pt in hand washign with SETUP.   Shoulder Instructions       General Comments  Pertinent Vitals/ Pain       Pain Assessment: No/denies pain  Home Living                                          Prior Functioning/Environment              Frequency  Min 1X/week        Progress Toward Goals  OT Goals(current goals can now be found in the care plan section)  Progress towards OT goals: Progressing toward goals  Acute Rehab OT Goals Patient Stated Goal: to go home OT Goal Formulation: Patient unable to participate in goal setting Time For Goal Achievement: 12/12/20 Potential to Achieve Goals: [place] Discharge plan  needs to be updated    Co-evaluation                 AM-PAC OT "6 Clicks" Daily Activity     Outcome Measure   Help from another person eating meals?: A Little Help from another person taking care of personal grooming?: A Little Help from another person toileting, which includes using toliet, bedpan, or urinal?: A Lot Help from another person bathing (including washing, rinsing, drying)?: A Lot Help from another person to put on and taking off regular upper body clothing?: A Little Help from another person to put on and taking off regular lower body clothing?: A Lot 6 Click Score: 15    End of Session Equipment Utilized During Treatment: Gait belt  OT Visit Diagnosis: Unsteadiness on feet (R26.81);Muscle weakness (generalized) (M62.81);Adult, failure to thrive (R62.7)   Activity Tolerance Patient tolerated treatment well   Patient Left in chair;with call bell/phone within reach;with chair alarm set (pure-wick in place)   Nurse Communication Mobility status        Time: 3329-5188 OT Time Calculation (min): 18 min  Charges: OT General Charges $OT Visit: 1 Visit OT Treatments $Self Care/Home Management : 8-22 mins  Gerrianne Scale, [place], OTR/L ascom [phone] 12/02/20, 7:38 PM

## 2020-12-03 LAB — GLUCOSE, CAPILLARY
Glucose-Capillary: 138 mg/dL — ABNORMAL HIGH (ref 70–99)
Glucose-Capillary: 161 mg/dL — ABNORMAL HIGH (ref 70–99)
Glucose-Capillary: 186 mg/dL — ABNORMAL HIGH (ref 70–99)
Glucose-Capillary: 205 mg/dL — ABNORMAL HIGH (ref 70–99)

## 2020-12-03 MED ORDER — PREDNISONE 10 MG PO TABS: ORAL_TABLET | ORAL | 0 refills | Status: AC

## 2020-12-03 NOTE — Plan of Care (Signed)

## 2020-12-03 NOTE — Discharge Instructions (Signed)
COVID-19: What Your Test Results Mean If you test positive for COVID-19 Take steps to protect others regardless of your COVID-19 vaccination status Stay home.  Isolate at home for at least 10 days. Stay in a specific room and away from other people in your home. Get rest and stay hydrated. If you develop symptoms, continue to isolate for at least 10 days after symptoms began and until you do not have a fever without using medications to reduce fever. Stay in touch with your doctor. Contact your doctor as soon as possible if you are an older adult or have underlying medical conditions. Contact your doctor or health department about isolation if you  Are severely ill or have a weakened immune system.  Had a positive test result followed by a negative result.  Test positive for many weeks. If you test negative for COVID-19:  The virus was not detected. If you have symptoms of COVID-19:  You may have received a false negative test result and still might have COVID-19.  Isolate from others. If you do not have symptoms of COVID-19 and you were exposed to a person with COVID-19:  You are likely not infected, but you still may get sick.  Contact your doctor about your symptoms, about follow-up testing, and how long to isolate.  Self-quarantine for 14 days at home after your exposure.  If you are fully vaccinated, you do not need to self quarantine.  Contact your doctor or local health department regarding options to reduce the length of your quarantine. A negative test result does not mean you won't get sick later. [email or web address] 03/29/2020 This information is not intended to replace advice given to you by your health care provider. Make sure you discuss any questions you have with your health care provider. Document Revised: 05/02/2020 Document Reviewed: 05/02/2020 Elsevier Patient Education  2021 [employer].

## 2020-12-03 NOTE — Discharge Summary (Signed)
Discharge Summary  [name] TMH:[id number] DOB: [birth year]  PCP: Dion Body, MD  Admit date: 11/27/2020 Discharge date: 12/03/2020  Time spent: 25 minutes  Recommendations for Outpatient Follow-up:  1. New medication: Prednisone taper 2. Patient is going back home, home with hospice  Discharge Diagnoses:  Active Hospital Problems   Diagnosis Date Noted  . COVID-19 virus infection 11/27/2020  . Thrombocytopenia ([place])   . Neutropenia ([place])   . ILD (interstitial lung disease) ([place]) 08/02/2015  . Diabetes mellitus, type 2 ([place]) 08/02/2015  . Essential (primary) hypertension 10/15/2013  . Anxiety and depression 10/15/2013  . Arthritis or polyarthritis, rheumatoid ([place]) 10/15/2013    Resolved Hospital Problems  No resolved problems to display.    Discharge Condition: Stable, discharged to home with hospice  Diet recommendation: Carb modified  Vitals:   12/03/20 0457 12/03/20 0824  BP: (!) 155/82 123/74  Pulse: 62 88  Resp: 16 18  Temp: 98.1 F (36.7 C) (!) 97.3 F (36.3 C)  SpO2: 97% 98%    History of present illness:  Mackenzie L Florenceis a 87 y.o.femalewith medical history significant forinterstitial lung disease, rheumatoid arthritis, T2DM, HTN, HLD, depression/anxiety who is currently active with hospice and presented to the emergency room on 5/29 with complaints of nausea.  Patient had recently tested positive for COVID 2 days prior.  She is having generalized weakness and fatigue and new diarrhea on 5/29 and family try to get her up she passed out.  EMS called.  Emergency room, COVID confirmed.  Patient was admitted to the hospitalist service.  Started on Remdisivir and steroids.  Hospital Course:  Principal Problem:   COVID-19 virus infection with secondary diarrhea: Patient completed 5-day course of IV Remdisivir.  Continued on steroids.  By day of discharge, CRP down to 1.4.  Will be discharged on quick prednisone taper.  Fluids given to replace her  GI losses.  As needed Imodium.  Diarrhea has since resolved. Active Problems:   Anxiety and depression   Diabetes mellitus, type 2 ([place]): Monitor CBGs while on steroids.  Resume metformin as outpatient.    Essential (primary) hypertension: Resume antihypertensives upon discharge.    ILD (interstitial lung disease) [facility]): Patient monitored closely with nebulizers, oxygen saturations kept above 92%.    Arthritis or polyarthritis, rheumatoid ([place]): Continue Humira and methotrexate as outpatient.    Neutropenia [facility]): Patient developed neutropenia following admission.  This was felt to be secondary to COVID infection and bone marrow suppression.  It has since resolved.    Thrombocytopenia ([place]): Stable.  Platelets at 134 on 6/3.  Procedures:  None  Consultations:  Hospice  Discharge Exam: BP 123/74 (BP Location: Left Arm)   Pulse 88   Temp (!) 97.3 F (36.3 C) (Oral)   Resp 18   Ht 5\' 1"  (1.549 m)   Wt 59.4 kg   SpO2 98%   BMI 24.74 kg/m   General: Alert and oriented x2, no acute distress Cardiovascular: Regular rate and rhythm, S1-S2 Respiratory: Clear to auscultation bilaterally  Discharge Instructions You were cared for by a hospitalist during your hospital stay. If you have any questions about your discharge medications or the care you received while you were in the hospital after you are discharged, you can call the unit and asked to speak with the hospitalist on call if the hospitalist that took care of you is not available. Once you are discharged, your primary care physician will handle any further medical issues. Please note that NO REFILLS for  any discharge medications will be authorized once you are discharged, as it is imperative that you return to your primary care physician (or establish a relationship with a primary care physician if you do not have one) for your aftercare needs so that they can reassess your need for medications and monitor your lab  values.   Allergies as of 12/03/2020      Reactions   Lisinopril Cough   Oxycodone-acetaminophen Other (See Comments)   Caused AMS & hallucinations   Penicillins Other (See Comments)   Makes pt very "forgetful" Did it involve swelling of the face/tongue/throat, SOB, or low BP? Unknown Did it involve sudden or severe rash/hives, skin peeling, or any reaction on the inside of your mouth or nose? Unknown Did you need to seek medical attention at a hospital or doctor's office? Unknown When did it last happen? If all above answers are "NO", may proceed with cephalosporin use.   Clarithromycin Rash   Sulfa Antibiotics Rash      Medication List    STOP taking these medications   lovastatin 40 MG tablet Commonly known as: MEVACOR     TAKE these medications   allopurinol 300 MG tablet Commonly known as: ZYLOPRIM Take 300 mg by mouth every morning.   amLODipine 5 MG tablet Commonly known as: NORVASC Take 5 mg by mouth daily.   aspirin EC 325 MG tablet Take 325 mg by mouth daily.   Cranberry 400 MG Caps Take 1 capsule by mouth 2 (two) times daily.   LORazepam 0.5 MG tablet Commonly known as: ATIVAN Take 0.5 mg by mouth every 6 (six) hours as needed.   losartan 25 MG tablet Commonly known as: COZAAR Take 25 mg by mouth daily.   MAGnesium-Oxide 400 (241.3 Mg) MG tablet Generic drug: magnesium oxide Take 400 mg by mouth daily.   melatonin 3 MG Tabs tablet Take 6 mg by mouth at bedtime.   metFORMIN 500 MG tablet Commonly known as: GLUCOPHAGE Take 500 mg by mouth 2 (two) times daily.   methotrexate 50 MG/2ML injection Inject 6 mg as directed once a week.   mirtazapine 7.5 MG tablet Commonly known as: REMERON Take 7.5 mg by mouth at bedtime.   omeprazole 20 MG capsule Commonly known as: PRILOSEC Take 40 mg by mouth daily.   ondansetron 4 MG disintegrating tablet Commonly known as: ZOFRAN-ODT Take 4 mg by mouth every 8 (eight) hours as needed.   predniSONE  10 MG tablet Commonly known as: DELTASONE Take 4 tablets (40 mg total) by mouth daily for 1 day, THEN 2 tablets (20 mg total) daily for 1 day, THEN 1 tablet (10 mg total) daily for 1 day. Start taking on: December 03, 2020   PROBIOTIC ACIDOPHILUS PO Take 1 tablet by mouth daily.   sertraline 50 MG tablet Commonly known as: ZOLOFT Take 25 mg by mouth daily.   vitamin C 1000 MG tablet Take 1,000 mg by mouth daily.      Allergies  Allergen Reactions  . Lisinopril Cough  . Oxycodone-Acetaminophen Other (See Comments)    Caused AMS & hallucinations  . Penicillins Other (See Comments)    Makes pt very "forgetful" Did it involve swelling of the face/tongue/throat, SOB, or low BP? Unknown Did it involve sudden or severe rash/hives, skin peeling, or any reaction on the inside of your mouth or nose? Unknown Did you need to seek medical attention at a hospital or doctor's office? Unknown When did it last happen? If all above  answers are "NO", may proceed with cephalosporin use.   . Clarithromycin Rash  . Sulfa Antibiotics Rash      The results of significant diagnostics from this hospitalization (including imaging, microbiology, ancillary and laboratory) are listed below for reference.    Significant Diagnostic Studies: DG Chest Portable 1 View  Result Date: 11/27/2020 CLINICAL DATA:  COVID-19 positivity with recent syncopal episode EXAM: PORTABLE CHEST 1 VIEW COMPARISON:  01/07/2016 FINDINGS: Cardiac shadow is enlarged but stable. Mild patchy airspace opacities are noted slightly greater than that seen on the prior exam likely related to the underlying COVID diagnosis. No sizable effusion is seen. Postsurgical changes in the cervical spine are noted. No acute bony abnormality is seen. IMPRESSION: Patchy airspace opacities likely related to the given clinical history. Electronically Signed   By: Inez Catalina M.D.   On: 11/27/2020 20:38    Microbiology: Recent Results (from the past  240 hour(s))  Resp Panel by RT-PCR (Flu A&B, Covid) Nasopharyngeal Swab     Status: Abnormal   Collection Time: 11/27/20  8:17 PM   Specimen: Nasopharyngeal Swab; Nasopharyngeal(NP) swabs in vial transport medium  Result Value Ref Range Status   SARS Coronavirus 2 by RT PCR POSITIVE (A) NEGATIVE Final    Comment: RESULT CALLED TO, READ BACK BY AND VERIFIED WITH: Riley @ 2110 11/27/20 LFD (NOTE) SARS-CoV-2 target nucleic acids are DETECTED.  The SARS-CoV-2 RNA is generally detectable in upper respiratory specimens during the acute phase of infection. Positive results are indicative of the presence of the identified virus, but do not rule out bacterial infection or co-infection with other pathogens not detected by the test. Clinical correlation with patient history and other diagnostic information is necessary to determine patient infection status. The expected result is Negative.  Fact Sheet for Patients: EntrepreneurPulse.com.au  Fact Sheet for Healthcare Providers: IncredibleEmployment.be  This test is not yet approved or cleared by the [place] FDA and  has been authorized for detection and/or diagnosis of SARS-CoV-2 by FDA under an Emergency Use Authorization (EUA).  This EUA will remain in effect (meaning this test can be  used) for the duration of  the COVID-19 declaration under Section 564(b)(1) of the Act, 21 U.S.C. section 360bbb-3(b)(1), unless the authorization is terminated or revoked sooner.     Influenza A by PCR NEGATIVE NEGATIVE Final   Influenza B by PCR NEGATIVE NEGATIVE Final    Comment: (NOTE) The Xpert Xpress SARS-CoV-2/FLU/RSV plus assay is intended as an aid in the diagnosis of influenza from Nasopharyngeal swab specimens and should not be used as a sole basis for treatment. Nasal washings and aspirates are unacceptable for Xpert Xpress SARS-CoV-2/FLU/RSV testing.  Fact Sheet for  Patients: EntrepreneurPulse.com.au  Fact Sheet for Healthcare Providers: IncredibleEmployment.be  This test is not yet approved or cleared by the [place] FDA and has been authorized for detection and/or diagnosis of SARS-CoV-2 by FDA under an Emergency Use Authorization (EUA). This EUA will remain in effect (meaning this test can be used) for the duration of the COVID-19 declaration under Section 564(b)(1) of the Act, 21 U.S.C. section 360bbb-3(b)(1), unless the authorization is terminated or revoked.  Performed at [facility], [address] [place], [place] [address]      Labs: Basic Metabolic Panel: Recent Labs  Lab 11/28/20 0308 11/29/20 0445 11/30/20 0531 12/01/20 0334 12/02/20 0356  NA 135 135 135 134* 133*  K 4.1 5.0 4.2 5.2* 3.9  CL 100 99 100 100 100  CO2 24 24  25 28 25   GLUCOSE 66* 158* 165* 187* 204*  BUN 22 17 22  25* 25*  CREATININE 0.78 0.71 0.58 0.79 0.69  CALCIUM 9.1 9.5 9.3 9.4 8.8*  MG 1.5* 1.7 1.7 1.7 1.8  PHOS 3.2 3.9 2.7 2.3* 2.0*   Liver Function Tests: Recent Labs  Lab 11/28/20 0308 11/29/20 0445 11/30/20 0531 12/01/20 0334 12/02/20 0356  AST 66* 53* 41 43* 40  ALT 33 30 27 27 24   ALKPHOS 50 53 51 51 57  BILITOT 0.8 0.8 0.8 0.6 0.7  PROT 7.8 8.1 8.1 7.8 7.6  ALBUMIN 3.4* 3.5 3.4* 3.3* 3.3*   Recent Labs  Lab 11/27/20 2017  LIPASE 93*   No results for input(s): AMMONIA in the last 168 hours. CBC: Recent Labs  Lab 11/28/20 0308 11/29/20 0445 11/30/20 0531 12/01/20 0334 12/02/20 0356  WBC 2.8* 1.2* 2.3* 3.9* 3.8*  NEUTROABS 1.0* 0.4* 1.1* 2.2 2.0  HGB 12.3 13.1 12.6 13.2 12.6  HCT 37.8 37.6 36.8 39.1 38.5  MCV 87.1 83.4 84.2 85.9 85.2  PLT 134* 134* 142* 150 134*   Cardiac Enzymes: No results for input(s): CKTOTAL, CKMB, CKMBINDEX, TROPONINI in the last 168 hours. BNP: BNP (last 3 results) No results for input(s): BNP in the last 8760 hours.  ProBNP (last 3  results) No results for input(s): PROBNP in the last 8760 hours.  CBG: Recent Labs  Lab 12/02/20 1537 12/02/20 2022 12/03/20 0056 12/03/20 0556 12/03/20 0811  GLUCAP 126* 171* 186* 205* 161*       Signed:  Annita Brod, MD [facility] 12/03/2020, 10:22 AM

## 2020-12-03 NOTE — TOC Transition Note (Signed)
Transition of Care [facility]) - CM/SW Discharge Note   Patient Details  Name: [name] MRN: [id number] Date of Birth: 1931-09-26  [facility]) CM/SW Contact:  Alberteen Sam, LCSW Phone Number: 12/03/2020, 9:44 AM   Clinical Narrative:     Patient will DC to: home with hospice Anticipated DC date: 12/03/20 Family notified:daughter Amy Transport EQ:JEADGN to transport home  Per MD patient ready for DC to home with hospice . Patient will be followed by [facility], family to transport home. No other needs identified at this time.   TOC signing off.   Pricilla Riffle, LCSW   Final next level of care: Home w Hospice Care Barriers to Discharge: No Barriers Identified   Patient Goals and CMS Choice Patient states their goals for this hospitalization and ongoing recovery are:: to go home CMS Medicare.gov Compare Post Acute Care list provided to:: Patient Choice offered to / list presented to : Patient  Discharge Placement                  Name of family member notified: Amy daughter Patient and family notified of of transfer: 12/03/20  Discharge Plan and Services In-house Referral: Hospice / Palliative Care Discharge Planning Services: CM Consult            DME Arranged: N/A DME Agency: NA       HH Arranged: NA          Social Determinants of Health (SDOH) Interventions     Readmission Risk Interventions No flowsheet data found.

## 2021-02-17 DIAGNOSIS — Z515 Encounter for palliative care: Secondary | ICD-10-CM

## 2021-04-01 ENCOUNTER — Inpatient Hospital Stay
Admission: EM | Admit: 2021-04-01 | Discharge: 2021-04-02 | DRG: 309 | Disposition: A | Discharge status: Home / Self Care | Attending: Hospitalist | Admitting: Hospitalist

## 2021-04-01 ENCOUNTER — Emergency Department

## 2021-04-01 ENCOUNTER — Other Ambulatory Visit: Payer: Self-pay

## 2021-04-01 DIAGNOSIS — Z9011 Acquired absence of right breast and nipple: Secondary | ICD-10-CM | POA: Diagnosis not present

## 2021-04-01 DIAGNOSIS — I10 Essential (primary) hypertension: Secondary | ICD-10-CM | POA: Diagnosis present

## 2021-04-01 DIAGNOSIS — J849 Interstitial pulmonary disease, unspecified: Secondary | ICD-10-CM | POA: Diagnosis present

## 2021-04-01 DIAGNOSIS — Z882 Allergy status to sulfonamides status: Secondary | ICD-10-CM

## 2021-04-01 DIAGNOSIS — K219 Gastro-esophageal reflux disease without esophagitis: Secondary | ICD-10-CM | POA: Diagnosis present

## 2021-04-01 DIAGNOSIS — M069 Rheumatoid arthritis, unspecified: Secondary | ICD-10-CM | POA: Diagnosis present

## 2021-04-01 DIAGNOSIS — Z515 Encounter for palliative care: Secondary | ICD-10-CM

## 2021-04-01 DIAGNOSIS — F0394 Unspecified dementia, unspecified severity, with anxiety: Secondary | ICD-10-CM | POA: Diagnosis present

## 2021-04-01 DIAGNOSIS — Z79899 Other long term (current) drug therapy: Secondary | ICD-10-CM

## 2021-04-01 DIAGNOSIS — Z825 Family history of asthma and other chronic lower respiratory diseases: Secondary | ICD-10-CM | POA: Diagnosis not present

## 2021-04-01 DIAGNOSIS — Z8041 Family history of malignant neoplasm of ovary: Secondary | ICD-10-CM

## 2021-04-01 DIAGNOSIS — F32A Depression, unspecified: Secondary | ICD-10-CM | POA: Diagnosis present

## 2021-04-01 DIAGNOSIS — I2510 Atherosclerotic heart disease of native coronary artery without angina pectoris: Secondary | ICD-10-CM | POA: Diagnosis present

## 2021-04-01 DIAGNOSIS — E785 Hyperlipidemia, unspecified: Secondary | ICD-10-CM | POA: Diagnosis present

## 2021-04-01 DIAGNOSIS — Z8371 Family history of colonic polyps: Secondary | ICD-10-CM

## 2021-04-01 DIAGNOSIS — Z800 Family history of malignant neoplasm of digestive organs: Secondary | ICD-10-CM | POA: Diagnosis not present

## 2021-04-01 DIAGNOSIS — Z885 Allergy status to narcotic agent status: Secondary | ICD-10-CM

## 2021-04-01 DIAGNOSIS — H9190 Unspecified hearing loss, unspecified ear: Secondary | ICD-10-CM | POA: Diagnosis present

## 2021-04-01 DIAGNOSIS — R001 Bradycardia, unspecified: Secondary | ICD-10-CM | POA: Diagnosis present

## 2021-04-01 DIAGNOSIS — E119 Type 2 diabetes mellitus without complications: Secondary | ICD-10-CM

## 2021-04-01 DIAGNOSIS — C50411 Malignant neoplasm of upper-outer quadrant of right female breast: Secondary | ICD-10-CM

## 2021-04-01 DIAGNOSIS — Z923 Personal history of irradiation: Secondary | ICD-10-CM

## 2021-04-01 DIAGNOSIS — G4700 Insomnia, unspecified: Secondary | ICD-10-CM | POA: Diagnosis present

## 2021-04-01 DIAGNOSIS — Z853 Personal history of malignant neoplasm of breast: Secondary | ICD-10-CM | POA: Diagnosis not present

## 2021-04-01 DIAGNOSIS — D696 Thrombocytopenia, unspecified: Secondary | ICD-10-CM | POA: Diagnosis present

## 2021-04-01 DIAGNOSIS — M109 Gout, unspecified: Secondary | ICD-10-CM | POA: Diagnosis present

## 2021-04-01 DIAGNOSIS — F419 Anxiety disorder, unspecified: Secondary | ICD-10-CM

## 2021-04-01 DIAGNOSIS — Z981 Arthrodesis status: Secondary | ICD-10-CM | POA: Diagnosis not present

## 2021-04-01 DIAGNOSIS — Z171 Estrogen receptor negative status [ER-]: Secondary | ICD-10-CM

## 2021-04-01 DIAGNOSIS — I4510 Unspecified right bundle-branch block: Secondary | ICD-10-CM | POA: Diagnosis not present

## 2021-04-01 DIAGNOSIS — D709 Neutropenia, unspecified: Secondary | ICD-10-CM | POA: Diagnosis present

## 2021-04-01 DIAGNOSIS — Z880 Allergy status to penicillin: Secondary | ICD-10-CM

## 2021-04-01 DIAGNOSIS — Z66 Do not resuscitate: Secondary | ICD-10-CM | POA: Diagnosis present

## 2021-04-01 DIAGNOSIS — I442 Atrioventricular block, complete: Secondary | ICD-10-CM | POA: Diagnosis present

## 2021-04-01 DIAGNOSIS — Z20822 Contact with and (suspected) exposure to covid-19: Secondary | ICD-10-CM | POA: Diagnosis present

## 2021-04-01 DIAGNOSIS — Z7982 Long term (current) use of aspirin: Secondary | ICD-10-CM

## 2021-04-01 DIAGNOSIS — R9431 Abnormal electrocardiogram [ECG] [EKG]: Secondary | ICD-10-CM | POA: Diagnosis not present

## 2021-04-01 DIAGNOSIS — Z7984 Long term (current) use of oral hypoglycemic drugs: Secondary | ICD-10-CM

## 2021-04-01 DIAGNOSIS — Z7952 Long term (current) use of systemic steroids: Secondary | ICD-10-CM

## 2021-04-01 LAB — COMPREHENSIVE METABOLIC PANEL
ALT: 21 U/L (ref 0–44)
AST: 26 U/L (ref 15–41)
Albumin: 3.5 g/dL (ref 3.5–5.0)
Alkaline Phosphatase: 56 U/L (ref 38–126)
Anion gap: 11 (ref 5–15)
BUN: 14 mg/dL (ref 8–23)
CO2: 27 mmol/L (ref 22–32)
Calcium: 9 mg/dL (ref 8.9–10.3)
Chloride: 98 mmol/L (ref 98–111)
Creatinine, Ser: 0.71 mg/dL (ref 0.44–1.00)
GFR, Estimated: >60 mL/min (ref >60)
Glucose, Bld: 174 mg/dL — ABNORMAL HIGH (ref 70–99)
Potassium: 3.7 mmol/L (ref 3.5–5.1)
Sodium: 136 mmol/L (ref 135–145)
Total Bilirubin: 1 mg/dL (ref 0.3–1.2)
Total Protein: 7.3 g/dL (ref 6.5–8.1)

## 2021-04-01 LAB — CBC WITH DIFFERENTIAL/PLATELET
Abs Immature Granulocytes: 0.08 10*3/uL — ABNORMAL HIGH (ref 0.00–0.07)
Basophils Absolute: 0.1 10*3/uL (ref 0.0–0.1)
Basophils Relative: 1 %
Eosinophils Absolute: 0.2 10*3/uL (ref 0.0–0.5)
Eosinophils Relative: 1 %
HCT: 41 % (ref 36.0–46.0)
Hemoglobin: 14.1 g/dL (ref 12.0–15.0)
Immature Granulocytes: 1 %
Lymphocytes Relative: 17 %
Lymphs Abs: 1.8 10*3/uL (ref 0.7–4.0)
MCH: 29.7 pg (ref 26.0–34.0)
MCHC: 34.4 g/dL (ref 30.0–36.0)
MCV: 86.5 fL (ref 80.0–100.0)
Monocytes Absolute: 0.6 10*3/uL (ref 0.1–1.0)
Monocytes Relative: 5 %
Neutro Abs: 8 10*3/uL — ABNORMAL HIGH (ref 1.7–7.7)
Neutrophils Relative %: 75 %
Platelets: 180 10*3/uL (ref 150–400)
RBC: 4.74 MIL/uL (ref 3.87–5.11)
RDW: 14.6 % (ref 11.5–15.5)
WBC: 10.7 10*3/uL — ABNORMAL HIGH (ref 4.0–10.5)
nRBC: 0 % (ref 0.0–0.2)

## 2021-04-01 LAB — RESP PANEL BY RT-PCR (FLU A&B, COVID) ARPGX2
Influenza A by PCR: NEGATIVE
Influenza B by PCR: NEGATIVE
SARS Coronavirus 2 by RT PCR: NEGATIVE

## 2021-04-01 LAB — TROPONIN I (HIGH SENSITIVITY)
Troponin I (High Sensitivity): 17 ng/L (ref <18)
Troponin I (High Sensitivity): 16 ng/L (ref <18)

## 2021-04-01 MED ORDER — TRAZODONE HCL 50 MG PO TABS: 50.0000 mg | ORAL_TABLET | Freq: Every evening | ORAL | Status: DC | PRN

## 2021-04-01 MED ORDER — PANTOPRAZOLE SODIUM 40 MG PO TBEC
80.0000 mg | DELAYED_RELEASE_TABLET | Freq: Every day | ORAL | Status: DC
  Administered 2021-04-02: 80 mg via ORAL
  Filled 2021-04-01: qty 2

## 2021-04-01 MED ORDER — MELATONIN 5 MG PO TABS
5.0000 mg | ORAL_TABLET | Freq: Every day | ORAL | Status: DC
  Administered 2021-04-01: 5 mg via ORAL
  Filled 2021-04-01: qty 1

## 2021-04-01 MED ORDER — METFORMIN HCL 500 MG PO TABS
500.0000 mg | ORAL_TABLET | Freq: Two times a day (BID) | ORAL | Status: DC
  Administered 2021-04-02: 500 mg via ORAL
  Filled 2021-04-01: qty 1

## 2021-04-01 MED ORDER — ACETAMINOPHEN 325 MG RE SUPP
650.0000 mg | Freq: Four times a day (QID) | RECTAL | Status: DC | PRN
  Filled 2021-04-01: qty 2

## 2021-04-01 MED ORDER — SERTRALINE HCL 50 MG PO TABS
25.0000 mg | ORAL_TABLET | Freq: Every day | ORAL | Status: DC
  Administered 2021-04-02: 25 mg via ORAL
  Filled 2021-04-01: qty 1

## 2021-04-01 MED ORDER — ONDANSETRON HCL 4 MG PO TABS: 4.0000 mg | ORAL_TABLET | Freq: Four times a day (QID) | ORAL | Status: DC | PRN

## 2021-04-01 MED ORDER — POLYETHYLENE GLYCOL 3350 17 G PO PACK: 17.0000 g | PACK | Freq: Every day | ORAL | Status: DC | PRN

## 2021-04-01 MED ORDER — PREDNISONE 10 MG PO TABS
10.0000 mg | ORAL_TABLET | Freq: Every day | ORAL | Status: DC
  Administered 2021-04-02: 10 mg via ORAL
  Filled 2021-04-01: qty 1

## 2021-04-01 MED ORDER — ASPIRIN EC 325 MG PO TBEC
325.0000 mg | DELAYED_RELEASE_TABLET | Freq: Every day | ORAL | Status: DC
  Administered 2021-04-02: 325 mg via ORAL
  Filled 2021-04-01: qty 1

## 2021-04-01 MED ORDER — SODIUM CHLORIDE 0.9% FLUSH
3.0000 mL | Freq: Two times a day (BID) | INTRAVENOUS | Status: DC
  Administered 2021-04-01 – 2021-04-02 (×2): 3 mL via INTRAVENOUS

## 2021-04-01 MED ORDER — ACETAMINOPHEN 325 MG PO TABS: 650.0000 mg | ORAL_TABLET | Freq: Four times a day (QID) | ORAL | Status: DC | PRN

## 2021-04-01 MED ORDER — CRANBERRY 400 MG PO CAPS: 1.0000 | ORAL_CAPSULE | Freq: Two times a day (BID) | ORAL | Status: DC

## 2021-04-01 MED ORDER — LORAZEPAM 0.5 MG PO TABS
0.5000 mg | ORAL_TABLET | Freq: Four times a day (QID) | ORAL | Status: DC | PRN
  Administered 2021-04-01: 0.5 mg via ORAL
  Filled 2021-04-01: qty 1

## 2021-04-01 MED ORDER — ASCORBIC ACID 500 MG PO TABS
1000.0000 mg | ORAL_TABLET | Freq: Every day | ORAL | Status: DC
  Administered 2021-04-02: 1000 mg via ORAL
  Filled 2021-04-01: qty 2

## 2021-04-01 MED ORDER — ALLOPURINOL 300 MG PO TABS
300.0000 mg | ORAL_TABLET | ORAL | Status: DC
  Administered 2021-04-02: 300 mg via ORAL
  Filled 2021-04-01 (×2): qty 1

## 2021-04-01 MED ORDER — ONDANSETRON HCL 4 MG/2ML IJ SOLN
4.0000 mg | Freq: Four times a day (QID) | INTRAMUSCULAR | Status: DC | PRN
  Administered 2021-04-02: 4 mg via INTRAVENOUS
  Filled 2021-04-01: qty 2

## 2021-04-01 MED ORDER — MIRTAZAPINE 15 MG PO TABS: 7.5000 mg | ORAL_TABLET | Freq: Every day | ORAL | Status: DC

## 2021-04-01 NOTE — ED Triage Notes (Signed)
Pt arrives via EMS from home for weakness and a cough x1 week- pt states her entire household had a cough- pt had been on a z-pack and she has finished that- pt is a hospice pt with a DNR at her daughters house ([place] pt was on 2L by EMS, but sats are stable on RA here- pt has a hx of dementia

## 2021-04-01 NOTE — Progress Notes (Addendum)
Pt was admitted on the floor with no sisngs of distress. Pt alert x 4 but forgetful at time and very HOH. VSS except HR at 40. Pt and granduaghter was educated about safety and ascom within pt reach. Will continue to monitor.  Update 0013: CCMD called and reported pt just converted from 3rd degree AV block to SR now. NP Randol Kern made aware but no new order place. Will continue to monitor.

## 2021-04-01 NOTE — ED Provider Notes (Signed)
[facility] Emergency Department Provider Note   ____________________________________________   I have reviewed the triage vital signs and the nursing notes.   HISTORY  Chief Complaint Weakness   History limited by: Dementia, most history obtained from granddaughter at bedside.   HPI Mackenzie STRENG is a 87 y.o. female who presents to the emergency department today because of concerns for abnormal feeling today.  Family describes the patient stated that she felt away come over her.  She became more weak than normal.  Family does state that over the past week or so she has had cough and viral type symptoms.  She was put on a Z-Pak which she has since finished.  Patient does have dementia and cannot give any significant history.  Records reviewed. Per medical record review patient has a history of DM, HTN.   Past Medical History:  Diagnosis Date   Anxiety    Breast cancer ([place]) 2015   right- radiation   Cough    Depression    Diabetes mellitus without complication (HCC)    Dysrhythmia    Gout    HLD (hyperlipidemia)    HTN (hypertension)    Hypertension    Interstitial lung disease (HCC)    Lumbar spinal stenosis    Macular degeneration    Migraines    Osteoarthritis    Personal history of radiation therapy 2015   RIGHT lumpectomy w/ radiation 2015   Phlebitis    Pneumonia 08/2018   Pulmonary nodule    Rheumatoid arthritis [facility])     Patient Active Problem List   Diagnosis Date Noted   Thrombocytopenia ([place])    Neutropenia ([place])    COVID-19 virus infection 11/27/2020   Cough 08/20/2020   Asymptomatic bacteriuria 08/04/2020   Debility 08/01/2020   Dizziness 04/06/2020   Pneumonia 08/04/2018   Malignant neoplasm of upper-outer quadrant of right breast in female, estrogen receptor negative ([place]) 04/29/2017   Cervical spondylosis with radiculopathy 04/04/2016   Recurrent UTI 10/06/2015   Vaginal atrophy 10/06/2015   Diabetes mellitus, type  2 ([place]) 08/02/2015   ILD (interstitial lung disease) ([place]) 08/02/2015   Headache, migraine 08/02/2015   Inflammation of a vein 08/02/2015   Anxiety and depression 10/15/2013   Essential (primary) hypertension 10/15/2013   Gout 10/15/2013   Degeneration macular 10/15/2013   Combined fat and carbohydrate induced hyperlipemia 10/15/2013   Arthritis, degenerative 10/15/2013   Arthritis or polyarthritis, rheumatoid ([place]) 10/15/2013    Past Surgical History:  Procedure Laterality Date   ABDOMINAL HYSTERECTOMY     ANTERIOR CERVICAL DECOMP/DISCECTOMY FUSION N/A 04/04/2016   Procedure: ANTERIOR CERVICAL DECOMPRESSION/DISCECTOMY FUSION, INTERBODY PROSTHESIS,PLATE CERVICAL  FIVE-SIX,CERVICAL SIX-SEVEN,CERVICAL SEVEN-THORACIC ONE;  Surgeon: Newman Pies, MD;  Location: [place];  Service: Neurosurgery;  Laterality: N/A;   BREAST BIOPSY Right 02/21/2015   CALCIFICATION INVOLVING HYALINIZED STROMA AND BENIGN DUCTS   BREAST BIOPSY Right 03/29/2016   FAT NECROSIS WITH CALCIFICATIONS.    BREAST BIOPSY Right 2015   + invasive mam ca   BREAST BIOPSY Right 11/30/2016   BENIGN BREAST TISSUE WITH ORGANIZING FAT NECROSIS AND CHANGES    COLONOSCOPY     ESOPHAGOGASTRODUODENOSCOPY     KYPHOPLASTY N/A 01/08/2019   Procedure: T-12, L1 KYPHOPLASTY;  Surgeon: Hessie Knows, MD;  Location: [facility];  Service: Orthopedics;  Laterality: N/A;   LYMPH NODE BIOPSY Left    MASTECTOMY, PARTIAL Right     Prior to Admission medications   Medication Sig Start Date End Date Taking? Authorizing Provider  allopurinol (ZYLOPRIM) 300 MG tablet Take 300 mg by mouth every morning.     [provider]  amLODipine (NORVASC) 5 MG tablet Take 5 mg by mouth daily. 08/12/17 01/04/21  [provider]  Ascorbic Acid (VITAMIN C) 1000 MG tablet Take 1,000 mg by mouth daily.     [provider]  aspirin EC 325 MG tablet Take 325 mg by mouth daily.     [provider]  Cranberry 400 MG CAPS Take 1 capsule  by mouth 2 (two) times daily.    [provider]  Lactobacillus (PROBIOTIC ACIDOPHILUS PO) Take 1 tablet by mouth daily.    [provider]  LORazepam (ATIVAN) 0.5 MG tablet Take 0.5 mg by mouth every 6 (six) hours as needed. 11/15/20   [provider]  losartan (COZAAR) 25 MG tablet Take 25 mg by mouth daily. 11/14/20   [provider]  MAGNESIUM-OXIDE 400 (241.3 Mg) MG tablet Take 400 mg by mouth daily. 08/23/20   [provider]  melatonin 3 MG TABS tablet Take 6 mg by mouth at bedtime.    [provider]  metFORMIN (GLUCOPHAGE) 500 MG tablet Take 500 mg by mouth 2 (two) times daily. 11/16/20   [provider]  methotrexate 50 MG/2ML injection Inject 6 mg as directed once a week. 04/07/18   [provider]  mirtazapine (REMERON) 7.5 MG tablet Take 7.5 mg by mouth at bedtime.    [provider]  omeprazole (PRILOSEC) 20 MG capsule Take 40 mg by mouth daily. 11/14/20   [provider]  ondansetron (ZOFRAN-ODT) 4 MG disintegrating tablet Take 4 mg by mouth every 8 (eight) hours as needed. 07/15/20   [provider]  sertraline (ZOLOFT) 50 MG tablet Take 25 mg by mouth daily.    [provider]    Allergies Lisinopril, Oxycodone-acetaminophen, Penicillins, Clarithromycin, and Sulfa antibiotics  Family History  Problem Relation Age of Onset   Colon cancer Mother    Stomach cancer Mother    Esophageal cancer Mother    COPD Father    Colon polyps Sister    Ovarian cancer Sister    Kidney disease Neg Hx    Bladder Cancer Neg Hx    Kidney cancer Neg Hx    Breast cancer Neg Hx     Social History Social History   Tobacco Use   Smoking status: Never   Smokeless tobacco: Never  Vaping Use   Vaping Use: Never used  Substance Use Topics   Alcohol use: No    Alcohol/week: 0.0 standard drinks   Drug use: No    Review of Systems Unable to obtain reliable ROS secondary to dementia.    ____________________________________________   PHYSICAL EXAM:  VITAL SIGNS: ED Triage Vitals  Enc Vitals Group     BP 04/01/21 1701 (!) 168/60     Pulse Rate 04/01/21 1701 (!) 41     Resp 04/01/21 1701 20     Temp --      Temp src --      SpO2 04/01/21 1701 94 %     Weight 04/01/21 1702 120 lb (54.4 kg)     Height 04/01/21 1702 5\' 1"  (1.549 m)     Head Circumference --      Peak Flow --      Pain Score 04/01/21 1702 0   Constitutional: Alert and oriented.  Eyes: Conjunctivae are normal.  ENT      Head: Normocephalic and atraumatic.  Nose: No congestion/rhinnorhea.      Mouth/Throat: Mucous membranes are moist.      Neck: No stridor. Hematological/Lymphatic/Immunilogical: No cervical lymphadenopathy. Cardiovascular: Bradycardia Respiratory: Normal respiratory effort without tachypnea nor retractions. Breath sounds are clear and equal bilaterally. No wheezes/rales/rhonchi. Gastrointestinal: Soft and non tender. No rebound. No guarding.  Genitourinary: Deferred Musculoskeletal: Normal range of motion in all extremities. No lower extremity edema. Neurologic:  Dementia. Moving all extremities. Following commands.  Skin:  Skin is warm, dry and intact. No rash noted. Psychiatric: Mood and affect are normal. Speech and behavior are normal. Patient exhibits appropriate insight and judgment.  ____________________________________________    LABS (pertinent positives/negatives)  CMP wnl except glu 174 Trop hs 17 CBC wbc 10.7, hgb 14.1, plt 180  ____________________________________________   EKG  I, Nance Pear, attending physician, personally viewed and interpreted this EKG  EKG Time: 1658 Rate: 41 Rhythm: complete AV heart block Axis: left axis deviation Intervals: qtc 396 QRS: RBBB, LAFB ST changes: no st elevation Impression: abnormal ekg  ____________________________________________    RADIOLOGY  CXR Similar bilateral pulmonary  infiltrates  ____________________________________________   PROCEDURES  Procedures  ____________________________________________   INITIAL IMPRESSION / ASSESSMENT AND PLAN / ED COURSE  Pertinent labs & imaging results that were available during my care of the patient were reviewed by me and considered in my medical decision making (see chart for details).   Patient presented to the emergency department today because of concerns of an episode of "wave coming over her" as well as weakness.  EKG is notable for complete heart block.  I do wonder if this is secondary to the patient's Norvasc use.  Patient was placed on pacer pads.  Did discuss with granddaughter at bedside and POA daughter over the telephone.  Also discussed with Dr. Nehemiah Massed with cardiology.  Pacer pads were placed however given the patient is mentating well and has good blood pressure will not start external pacing. Discussed with hospitalist for admission.  ____________________________________________   FINAL CLINICAL IMPRESSION(S) / ED DIAGNOSES  Final diagnoses:  Complete heart block [facility])     Note: This dictation was prepared with Dragon dictation. Any transcriptional errors that result from this process are unintentional     Nance Pear, MD 04/01/21 2010

## 2021-04-01 NOTE — H&P (Signed)
[facility] History and Physical  CLEOPHA INDELICATO EUM:[id number] DOB: 02/27/1932 DOA: 04/01/2021  Referring physician: Dr. Archie Balboa PCP: Dion Body, MD   Chief Complaint: weakness and cough  HPI: ADALIE MAND is a 87 y.o. female currently on home hospice with hx of dementia, type 2 diabetes, rheumatoid arthritis, hypertension, gout, interstitial lung disease, anxiety and depression, who presents with weakness and cough for the past week.  Patient very hard of hearing and interview was somewhat difficult.  At present she reports that she feels fine and denies any problem with her chest.  When asked if she would want to have a pacemaker placed that she strongly states that she does not.  78 of history obtained from patient's granddaughter.  She states that her grandmother lives with her and has been on hospice at home, her grandfather built the house and passed away there and her grandmother has expressed a wish to do the same.  She reports that her husband called her earlier today saying that her grandmother did not feel right.  She came home and her grandmother said that something was wrong though she could not say exactly what, and that her chest did not feel right.  Granddaughter is very worried and called EMS to come get patient and bring her to the hospital.  Granddaughter is adamant the patient would not want to have a pacemaker placed or have any sort of significant intervention.  I also spoke with patient's daughter who is currently out of town.  She reiterated the same things regarding her mother's wishes.  We also discussed the possibility of temporary pacemakers, with the understanding that if we were to do that she would not be able to go home with a temporary pacing wire and the only long-term option would be to undergo a pacemaker placement.  In the ED initial vital signs notable for heart rate in the 30s to 40s, normal to hypertensive blood pressures.  CBC, CMP,  troponin are all normal.  COVID and influenza test are negative.  Chest x-ray showed bilateral patchy infiltrates that were not significantly changed from her last chest x-ray.  EKG was notable for complete heart block.  Case was discussed with cardiology by ED provider, given her DNR/DNI status no further inventions were recommended.  ED provider subsequently spoke with healthcare POA who said that they would be okay with temporary pacer wires if needed.  She was admitted for further observation.  Review of Systems:  Pertinent positives and negative per HPI, all others reviewed and negative  Past Medical History:  Diagnosis Date   Anxiety    Breast cancer ([place]) 2015   right- radiation   Cough    Depression    Diabetes mellitus without complication ([place])    Dysrhythmia    Gout    HLD (hyperlipidemia)    HTN (hypertension)    Hypertension    Interstitial lung disease (HCC)    Lumbar spinal stenosis    Macular degeneration    Migraines    Osteoarthritis    Personal history of radiation therapy 2015   RIGHT lumpectomy w/ radiation 2015   Phlebitis    Pneumonia 08/2018   Pulmonary nodule    Rheumatoid arthritis ([place])    Past Surgical History:  Procedure Laterality Date   ABDOMINAL HYSTERECTOMY     ANTERIOR CERVICAL DECOMP/DISCECTOMY FUSION N/A 04/04/2016   Procedure: ANTERIOR CERVICAL DECOMPRESSION/DISCECTOMY FUSION, INTERBODY PROSTHESIS,PLATE CERVICAL  FIVE-SIX,CERVICAL SIX-SEVEN,CERVICAL SEVEN-THORACIC ONE;  Surgeon: Newman Pies, MD;  Location:  [facility];  Service: Neurosurgery;  Laterality: N/A;   BREAST BIOPSY Right 02/21/2015   CALCIFICATION INVOLVING HYALINIZED STROMA AND BENIGN DUCTS   BREAST BIOPSY Right 03/29/2016   FAT NECROSIS WITH CALCIFICATIONS.    BREAST BIOPSY Right 2015   + invasive mam ca   BREAST BIOPSY Right 11/30/2016   BENIGN BREAST TISSUE WITH ORGANIZING FAT NECROSIS AND CHANGES    COLONOSCOPY     ESOPHAGOGASTRODUODENOSCOPY     KYPHOPLASTY N/A 01/08/2019    Procedure: T-12, L1 KYPHOPLASTY;  Surgeon: Hessie Knows, MD;  Location: [facility];  Service: Orthopedics;  Laterality: N/A;   LYMPH NODE BIOPSY Left    MASTECTOMY, PARTIAL Right    Social History:  reports that she has never smoked. She has never used smokeless tobacco. She reports that she does not drink alcohol and does not use drugs.  Allergies  Allergen Reactions   Lisinopril Cough   Oxycodone-Acetaminophen Other (See Comments)    Caused AMS & hallucinations   Penicillins Other (See Comments)    Makes pt very "forgetful" Did it involve swelling of the face/tongue/throat, SOB, or low BP? Unknown Did it involve sudden or severe rash/hives, skin peeling, or any reaction on the inside of your mouth or nose? Unknown Did you need to seek medical attention at a hospital or doctor's office? Unknown When did it last happen?       If all above answers are "NO", may proceed with cephalosporin use.    Clarithromycin Rash   Sulfa Antibiotics Rash    Family History  Problem Relation Age of Onset   Colon cancer Mother    Stomach cancer Mother    Esophageal cancer Mother    COPD Father    Colon polyps Sister    Ovarian cancer Sister    Kidney disease Neg Hx    Bladder Cancer Neg Hx    Kidney cancer Neg Hx    Breast cancer Neg Hx      Prior to Admission medications   Medication Sig Start Date End Date Taking? Authorizing Provider  allopurinol (ZYLOPRIM) 300 MG tablet Take 300 mg by mouth every morning.    Yes [provider]  amLODipine (NORVASC) 5 MG tablet Take 5 mg by mouth daily. 08/12/17 04/01/21 Yes [provider]  Ascorbic Acid (VITAMIN C) 1000 MG tablet Take 1,000 mg by mouth daily.    Yes [provider]  aspirin EC 325 MG tablet Take 325 mg by mouth daily.    Yes [provider]  Cranberry 400 MG CAPS Take 1 capsule by mouth 2 (two) times daily.   Yes [provider]  Lactobacillus (PROBIOTIC ACIDOPHILUS PO) Take 1 tablet by  mouth daily.   Yes [provider]  LORazepam (ATIVAN) 0.5 MG tablet Take 0.5 mg by mouth every 6 (six) hours as needed. 11/15/20  Yes [provider]  losartan (COZAAR) 25 MG tablet Take 25 mg by mouth daily. 11/14/20  Yes [provider]  melatonin 3 MG TABS tablet Take 6 mg by mouth at bedtime.   Yes [provider]  metFORMIN (GLUCOPHAGE) 500 MG tablet Take 500 mg by mouth 2 (two) times daily. 11/16/20  Yes [provider]  methotrexate 50 MG/2ML injection Inject 6 mg as directed once a week. 04/07/18  Yes [provider]  mirtazapine (REMERON) 7.5 MG tablet Take 7.5 mg by mouth at bedtime.   Yes [provider]  omeprazole (PRILOSEC) 20 MG capsule Take 40 mg by mouth daily.  11/14/20  Yes [provider]  predniSONE (DELTASONE) 10 MG tablet Take 10 mg by mouth daily. 03/21/21  Yes [provider]  sertraline (ZOLOFT) 50 MG tablet Take 25 mg by mouth daily.   Yes [provider]  lovastatin (MEVACOR) 40 MG tablet Take 40 mg by mouth daily. Patient not taking: Reported on 04/01/2021 02/12/21   [provider]  MAGNESIUM-OXIDE 400 (241.3 Mg) MG tablet Take 400 mg by mouth daily. Patient not taking: Reported on 04/01/2021 08/23/20   [provider]   Physical Exam: Vitals:   04/01/21 1706 04/01/21 1740 04/01/21 1800 04/01/21 1941  BP:  (!) 142/60 128/62 137/73  Pulse:  (!) 39 (!) 39   Resp:  17 20 13   Temp: 98.6 F (37 C)   97.9 F (36.6 C)  TempSrc: Oral   Oral  SpO2:  95% 96% 96%  Weight:      Height:        Wt Readings from Last 3 Encounters:  04/01/21 54.4 kg  11/28/20 59.4 kg  01/08/19 67 kg     General:  Appears calm and comfortable Eyes: PERRL, normal lids, irises & conjunctiva ENT: very hard of hearing Neck: no masses Cardiovascular: Regular, bradycardic, no m/r/g. No LE edema. Telemetry: bradycardia  Respiratory: CTA bilaterally, no w/r/r. Normal respiratory  effort. Abdomen: soft, ntnd Skin: no rash or induration seen on limited exam Musculoskeletal: grossly normal tone BUE/BLE Psychiatric: grossly normal mood and affect, speech fluent and appropriate Neurologic: grossly non-focal.          Labs on Admission:  Basic Metabolic Panel: Recent Labs  Lab 04/01/21 1703  NA 136  K 3.7  CL 98  CO2 27  GLUCOSE 174*  BUN 14  CREATININE 0.71  CALCIUM 9.0   Liver Function Tests: Recent Labs  Lab 04/01/21 1703  AST 26  ALT 21  ALKPHOS 56  BILITOT 1.0  PROT 7.3  ALBUMIN 3.5   No results for input(s): LIPASE, AMYLASE in the last 168 hours. No results for input(s): AMMONIA in the last 168 hours. CBC: Recent Labs  Lab 04/01/21 1703  WBC 10.7*  NEUTROABS 8.0*  HGB 14.1  HCT 41.0  MCV 86.5  PLT 180   Cardiac Enzymes: No results for input(s): CKTOTAL, CKMB, CKMBINDEX, TROPONINI in the last 168 hours.  BNP (last 3 results) No results for input(s): BNP in the last 8760 hours.  ProBNP (last 3 results) No results for input(s): PROBNP in the last 8760 hours.  CBG: No results for input(s): GLUCAP in the last 168 hours.  Radiological Exams on Admission: DG Chest Portable 1 View  Result Date: 04/01/2021 CLINICAL DATA:  Chest pain.  Cough. EXAM: PORTABLE CHEST 1 VIEW COMPARISON:  11/27/2020 FINDINGS: Heart size is normal. There are patchy infiltrates throughout the lungs bilaterally, similar in appearance to the prior study. Suspect LEFT pleural effusion. IMPRESSION: Similar bilateral pulmonary infiltrates. Electronically Signed   By: Nolon Nations M.D.   On: 04/01/2021 18:07    EKG: Independently reviewed.  Third-degree heart block, right bundle branch block.  Compared to prior, third-degree heart block is new, right bundle branch block is unchanged.  Assessment/Plan Active Problems:   Anxiety and depression   Diabetes mellitus, type 2 ([place])   Essential (primary) hypertension   Gout   ILD (interstitial lung disease) (HCC)    Arthritis or polyarthritis, rheumatoid (HCC)   Malignant neoplasm of upper-outer quadrant of right breast in female, estrogen receptor negative (HCC)   Neutropenia (  [place])   Thrombocytopenia ([place])   Third degree heart block ([place])   Bradycardia   DNR (do not resuscitate)   Hospice care patient   Ashia Dehner Tillis is a 87 y.o. female currently on home hospice with hx of dementia, type 2 diabetes, rheumatoid arthritis, hypertension, gout, interstitial lung disease, anxiety and depression, who presents with weakness and cough for the past week.  Work-up notable for unremarkable labs with EKG showing new third-degree heart block.  #Third degree heart block Patient well-appearing on exam and denying any symptoms at present.  Long discussions had with patient, daughter, and granddaughter.  Discussed in somewhat more detail what a temporary pacemaker and permanent pacemaker would be like, with the caveat that I am not a cardiologist.  I also discussed temporary external pacing in detail and we all agreed this would not be something that we should pursue.  Discussed that it appears that she does not wish to have a significant intervention such as a pacemaker placement, and given this there are not many treatment options.  Luckily there is nothing acute about patient's medical status at present.  I encouraged them to think about this is simply another of many medical issues she has that can negatively impact her quality/quantity of life, but that if her wish is to be at home on hospice and DNR/DNI, then I recommend overnight observation, cardiology evaluation in the morning, and if her wishes remain the same then she should return home.  Patient, daughter, and granddaughter were all in agreement with this plan. - Monitor on telemetry - Pacer pads left in place but code cart removed from room - Hold amiodarone in setting of bradycardia, will also hold losartan and allow permissive hypertension - Contact  cardiology in a.m. for formal consultation - Echocardiogram in a.m. - Contact hospice agency in a.m.  #Chronic medical problems Gout-continue allopurinol CAD-continue aspirin Current UTI-continue cranberry, lactobacillus Anxiety-continue Ativan, sertraline DM2-continue metformin 500 twice daily, no fingersticks given goals of care Rheumatoid arthritis-hold methotrexate injection Insomnia-continue melatonin, mirtazapine GERD-continue omeprazole  Code Status: DNR/DNI, confirmed DVT Prophylaxis: SCDs Family Communication: Granddaughter updated at bedside and Daughter Amy (medical POA) by phone Disposition Plan: Obs, Med-Surg   Time spent: 50 min  Clarnce Flock MD/MPH [facility]  Note:  This document was prepared using Systems analyst and may include unintentional dictation errors.

## 2021-04-02 ENCOUNTER — Inpatient Hospital Stay (HOSPITAL_COMMUNITY)
Admit: 2021-04-02 | Discharge: 2021-04-02 | Disposition: A | Discharge status: Home / Self Care | Attending: Family Medicine | Admitting: Family Medicine

## 2021-04-02 DIAGNOSIS — R9431 Abnormal electrocardiogram [ECG] [EKG]: Secondary | ICD-10-CM

## 2021-04-02 DIAGNOSIS — I4510 Unspecified right bundle-branch block: Secondary | ICD-10-CM

## 2021-04-02 LAB — ECHOCARDIOGRAM COMPLETE
AR max vel: 2.12 cm2
AV Peak grad: 6 mmHg
Ao pk vel: 1.22 m/s
Area-P 1/2: 2.47 cm2
Calc EF: 53.8 %
Height: 5 in
S' Lateral: 2.7 cm
Single Plane A2C EF: 49.1 %
Single Plane A4C EF: 56.6 %
Weight: 2045.87 [oz_av]

## 2021-04-02 LAB — BASIC METABOLIC PANEL WITH GFR
Anion gap: 6 (ref 5–15)
BUN: 21 mg/dL (ref 8–23)
CO2: 31 mmol/L (ref 22–32)
Calcium: 9.2 mg/dL (ref 8.9–10.3)
Chloride: 99 mmol/L (ref 98–111)
Creatinine, Ser: 0.84 mg/dL (ref 0.44–1.00)
GFR, Estimated: >60 mL/min
Glucose, Bld: 113 mg/dL — ABNORMAL HIGH (ref 70–99)
Potassium: 3.4 mmol/L — ABNORMAL LOW (ref 3.5–5.1)
Sodium: 136 mmol/L (ref 135–145)

## 2021-04-02 MED ORDER — LOSARTAN POTASSIUM 25 MG PO TABS
ORAL_TABLET | ORAL | Status: DC
Start: 2021-04-02 — End: 2021-04-02

## 2021-04-02 MED ORDER — AMLODIPINE BESYLATE 5 MG PO TABS
5.0000 mg | ORAL_TABLET | Freq: Every day | ORAL | Status: DC
  Administered 2021-04-02: 5 mg via ORAL
  Filled 2021-04-02: qty 1

## 2021-04-02 MED ORDER — LOSARTAN POTASSIUM 25 MG PO TABS: 25.0000 mg | ORAL_TABLET | Freq: Every day | ORAL | Status: AC

## 2021-04-02 NOTE — Discharge Summary (Signed)
Physician Discharge Summary   [name]  female DOB: 10-Sep-1931  TMH:[id number]  PCP: Dion Body, MD  Admit date: 04/01/2021 Discharge date: 04/02/2021  Admitted From: home Disposition:  home Granddaughter updated at bedside prior to discharge.  CODE STATUS: DNR  Discharge Instructions     Discharge instructions   Complete by: As directed    You presented with complete heart block, but have since converted back to normal heart rhythm.  Since you don't want invasive intervention such as pacemaker, cardiologist is ok with you going home today.  If heart block events happen more often and you don't feel well with it, then please have hospice provider prescribe something for symptom management.   Dr. Enzo Bi [name] Course:  For full details, please see H&P, progress notes, consult notes and ancillary notes.  Briefly,  [name] is a 87 y.o. female currently on home hospice with hx of dementia, type 2 diabetes, rheumatoid arthritis, hypertension, gout, interstitial lung disease, anxiety and depression, who presented with complaints of her chest not feeling right.  In the ED initial vital signs notable for heart rate in the 30s to 40s, EKG was notable for complete heart block.  # Third degree heart block --after presentation, Patient well-appearing on exam and denying any symptoms at present.  Pt declined pacemaker placement, and her family agreed.  Pt converted to normal sinus the next day.  I discussed with granddaughter at bedside that pt's aging heart is worn out and will likely go in and out of heart block from now now, and given pt's wish for no aggressive intervention and wanting to stay at home, pt could rely on in-home hospice provider for symptomatic management if needed.  #Chronic medical problems Gout-continue allopurinol CAD-continue aspirin Anxiety-continue Ativan, sertraline DM2-continue metformin 500 twice daily, no  fingersticks given goals of care Rheumatoid arthritis-methotrexate injection per outpatient schedule Insomnia-continue melatonin, mirtazapine GERD-continue omeprazole HTN--cont losartan   Discharge Diagnoses:  Active Problems:   Anxiety and depression   Diabetes mellitus, type 2 ([place])   Essential (primary) hypertension   Gout   ILD (interstitial lung disease) (HCC)   Arthritis or polyarthritis, rheumatoid (HCC)   Malignant neoplasm of upper-outer quadrant of right breast in female, estrogen receptor negative (HCC)   Neutropenia (HCC)   Thrombocytopenia (HCC)   Third degree heart block ([place])   Bradycardia   DNR (do not resuscitate)   Hospice care patient   Complete heart block ([place])   30 Day Unplanned Readmission Risk Score    Flowsheet Row ED to Hosp-Admission (Current) from 04/01/2021 in [place] [facility]  30 Day Unplanned Readmission Risk Score (%) 15.81 Filed at 04/02/2021 0801       This score is the patient's risk of an unplanned readmission within 30 days of being discharged (0 -100%). The score is based on dignosis, age, lab data, medications, orders, and past utilization.   Low:  0-14.9   Medium: 15-21.9   High: 22-29.9   Extreme: 30 and above         Discharge Instructions:  Allergies as of 04/02/2021       Reactions   Lisinopril Cough   Oxycodone-acetaminophen Other (See Comments)   Caused AMS & hallucinations   Penicillins Other (See Comments)   Makes pt very "forgetful" Did it involve swelling of the face/tongue/throat, SOB, or low BP? Unknown Did it involve sudden or severe rash/hives, skin peeling, or  any reaction on the inside of your mouth or nose? Unknown Did you need to seek medical attention at a hospital or doctor's office? Unknown When did it last happen?       If all above answers are "NO", may proceed with cephalosporin use.   Clarithromycin Rash   Sulfa Antibiotics Rash        Medication List     STOP taking these  medications    lovastatin 40 MG tablet Commonly known as: MEVACOR   MAGnesium-Oxide 400 (241.3 Mg) MG tablet Generic drug: magnesium oxide       TAKE these medications    allopurinol 300 MG tablet Commonly known as: ZYLOPRIM Take 300 mg by mouth every morning.   amLODipine 5 MG tablet Commonly known as: NORVASC Take 5 mg by mouth daily.   aspirin EC 325 MG tablet Take 325 mg by mouth daily.   Cranberry 400 MG Caps Take 1 capsule by mouth 2 (two) times daily.   LORazepam 0.5 MG tablet Commonly known as: ATIVAN Take 0.5 mg by mouth every 6 (six) hours as needed.   losartan 25 MG tablet Commonly known as: COZAAR Take 1 tablet (25 mg total) by mouth daily. Home med. What changed: additional instructions   melatonin 3 MG Tabs tablet Take 6 mg by mouth at bedtime.   metFORMIN 500 MG tablet Commonly known as: GLUCOPHAGE Take 500 mg by mouth 2 (two) times daily.   methotrexate 50 MG/2ML injection Inject 6 mg as directed once a week.   mirtazapine 7.5 MG tablet Commonly known as: REMERON Take 7.5 mg by mouth at bedtime.   omeprazole 20 MG capsule Commonly known as: PRILOSEC Take 40 mg by mouth daily.   predniSONE 10 MG tablet Commonly known as: DELTASONE Take 10 mg by mouth daily.   PROBIOTIC ACIDOPHILUS PO Take 1 tablet by mouth daily.   sertraline 50 MG tablet Commonly known as: ZOLOFT Take 25 mg by mouth daily.   vitamin C 1000 MG tablet Take 1,000 mg by mouth daily.          Allergies  Allergen Reactions   Lisinopril Cough   Oxycodone-Acetaminophen Other (See Comments)    Caused AMS & hallucinations   Penicillins Other (See Comments)    Makes pt very "forgetful" Did it involve swelling of the face/tongue/throat, SOB, or low BP? Unknown Did it involve sudden or severe rash/hives, skin peeling, or any reaction on the inside of your mouth or nose? Unknown Did you need to seek medical attention at a hospital or doctor's office? Unknown When  did it last happen?       If all above answers are "NO", may proceed with cephalosporin use.    Clarithromycin Rash   Sulfa Antibiotics Rash     The results of significant diagnostics from this hospitalization (including imaging, microbiology, ancillary and laboratory) are listed below for reference.   Consultations:   Procedures/Studies: DG Chest Portable 1 View  Result Date: 04/01/2021 CLINICAL DATA:  Chest pain.  Cough. EXAM: PORTABLE CHEST 1 VIEW COMPARISON:  11/27/2020 FINDINGS: Heart size is normal. There are patchy infiltrates throughout the lungs bilaterally, similar in appearance to the prior study. Suspect LEFT pleural effusion. IMPRESSION: Similar bilateral pulmonary infiltrates. Electronically Signed   By: Nolon Nations M.D.   On: 04/01/2021 18:07      Labs: BNP (last 3 results) No results for input(s): BNP in the last 8760 hours. Basic Metabolic Panel: Recent Labs  Lab 04/01/21 1703 04/02/21 0554  NA 136 136  K 3.7 3.4*  CL 98 99  CO2 27 31  GLUCOSE 174* 113*  BUN 14 21  CREATININE 0.71 0.84  CALCIUM 9.0 9.2   Liver Function Tests: Recent Labs  Lab 04/01/21 1703  AST 26  ALT 21  ALKPHOS 56  BILITOT 1.0  PROT 7.3  ALBUMIN 3.5   No results for input(s): LIPASE, AMYLASE in the last 168 hours. No results for input(s): AMMONIA in the last 168 hours. CBC: Recent Labs  Lab 04/01/21 1703  WBC 10.7*  NEUTROABS 8.0*  HGB 14.1  HCT 41.0  MCV 86.5  PLT 180   Cardiac Enzymes: No results for input(s): CKTOTAL, CKMB, CKMBINDEX, TROPONINI in the last 168 hours. BNP: Invalid input(s): POCBNP CBG: No results for input(s): GLUCAP in the last 168 hours. D-Dimer No results for input(s): DDIMER in the last 72 hours. Hgb A1c No results for input(s): HGBA1C in the last 72 hours. Lipid Profile No results for input(s): CHOL, HDL, LDLCALC, TRIG, CHOLHDL, LDLDIRECT in the last 72 hours. Thyroid function studies No results for input(s): TSH, T4TOTAL,  T3FREE, THYROIDAB in the last 72 hours.  Invalid input(s): FREET3 Anemia work up No results for input(s): VITAMINB12, FOLATE, FERRITIN, TIBC, IRON, RETICCTPCT in the last 72 hours. Urinalysis    Component Value Date/Time   COLORURINE YELLOW (A) 11/27/2020 2017   APPEARANCEUR HAZY (A) 11/27/2020 2017   APPEARANCEUR Clear 10/04/2015 1018   LABSPEC 1.023 11/27/2020 2017   LABSPEC 1.015 02/27/2014 2007   PHURINE 5.0 11/27/2020 2017   GLUCOSEU NEGATIVE 11/27/2020 2017   GLUCOSEU Negative 02/27/2014 2007   HGBUR NEGATIVE 11/27/2020 2017   BILIRUBINUR NEGATIVE 11/27/2020 2017   BILIRUBINUR Negative 10/04/2015 1018   BILIRUBINUR Negative 02/27/2014 2007   KETONESUR 5 (A) 11/27/2020 2017   PROTEINUR NEGATIVE 11/27/2020 2017   NITRITE NEGATIVE 11/27/2020 2017   LEUKOCYTESUR NEGATIVE 11/27/2020 2017   LEUKOCYTESUR Trace 02/27/2014 2007   Sepsis Labs Invalid input(s): PROCALCITONIN,  WBC,  LACTICIDVEN Microbiology Recent Results (from the past 240 hour(s))  Resp Panel by RT-PCR (Flu A&B, Covid) Nasopharyngeal Swab     Status: None   Collection Time: 04/01/21  5:13 PM   Specimen: Nasopharyngeal Swab; Nasopharyngeal(NP) swabs in vial transport medium  Result Value Ref Range Status   SARS Coronavirus 2 by RT PCR NEGATIVE NEGATIVE Final    Comment: (NOTE) SARS-CoV-2 target nucleic acids are NOT DETECTED.  The SARS-CoV-2 RNA is generally detectable in upper respiratory specimens during the acute phase of infection. The lowest concentration of SARS-CoV-2 viral copies this assay can detect is 138 copies/mL. A negative result does not preclude SARS-Cov-2 infection and should not be used as the sole basis for treatment or other patient management decisions. A negative result may occur with  improper specimen collection/handling, submission of specimen other than nasopharyngeal swab, presence of viral mutation(s) within the areas targeted by this assay, and inadequate number of  viral copies(<138 copies/mL). A negative result must be combined with clinical observations, patient history, and epidemiological information. The expected result is Negative.  Fact Sheet for Patients:  EntrepreneurPulse.com.au  Fact Sheet for Healthcare Providers:  IncredibleEmployment.be  This test is no t yet approved or cleared by the [place] FDA and  has been authorized for detection and/or diagnosis of SARS-CoV-2 by FDA under an Emergency Use Authorization (EUA). This EUA will remain  in effect (meaning this test can be used) for the duration of the COVID-19 declaration under Section 564(b)(1) of the Act, 21  U.S.C.section 360bbb-3(b)(1), unless the authorization is terminated  or revoked sooner.       Influenza A by PCR NEGATIVE NEGATIVE Final   Influenza B by PCR NEGATIVE NEGATIVE Final    Comment: (NOTE) The Xpert Xpress SARS-CoV-2/FLU/RSV plus assay is intended as an aid in the diagnosis of influenza from Nasopharyngeal swab specimens and should not be used as a sole basis for treatment. Nasal washings and aspirates are unacceptable for Xpert Xpress SARS-CoV-2/FLU/RSV testing.  Fact Sheet for Patients: EntrepreneurPulse.com.au  Fact Sheet for Healthcare Providers: IncredibleEmployment.be  This test is not yet approved or cleared by the [place] FDA and has been authorized for detection and/or diagnosis of SARS-CoV-2 by FDA under an Emergency Use Authorization (EUA). This EUA will remain in effect (meaning this test can be used) for the duration of the COVID-19 declaration under Section 564(b)(1) of the Act, 21 U.S.C. section 360bbb-3(b)(1), unless the authorization is terminated or revoked.  Performed at [facility], [address] [place], [place] [address]      Total time spend on discharging this patient, including the last patient exam, discussing the hospital  stay, instructions for ongoing care as it relates to all pertinent caregivers, as well as preparing the medical discharge records, prescriptions, and/or referrals as applicable, is 45 minutes.    Enzo Bi, MD  [facility] 04/02/2021, 11:11 AM

## 2021-04-02 NOTE — Consult Note (Signed)
[facility] Consultation Note  Patient ID: [name], MRN: [id number], DOB/AGE: 1931/12/09 87 y.o. Admit date: 04/01/2021   Date of Consult: 04/02/2021 Primary Physician: Dion Body, MD Primary Cardiologist: None  Chief Complaint:  Chief Complaint  Patient presents with   Weakness   Reason for Consult:  Heart block  HPI: 87 y.o. female with known diabetes hypertension hyperlipidemia and gout with significant amount of dementia and in hospice type setting.  The patient apparently was having significant weakness fatigue and other concerns for which she was brought to the emergency room.  At that time the patient had a chest x-ray with possible patchy infiltrates but otherwise blood pressure was also slightly elevated.  The patient did receive appropriate medication management for that but it was noted that the patient did have an abnormal EKG.  EKG showed that the patient was in normal sinus rhythm with third-degree block with ventricular escape of 40 bpm.  The patient did not have any significant amount of worsening symptoms and has been observed overnight with resolution of this third-degree block.  The patient now is in normal sinus rhythm at 63 bpm.  There is been no evidence of other significant symptoms including congestive heart failure or angina or other symptoms.  After long discussion with the family members it does appear this patient and the family wished to be very conservative and not have any invasive procedures unless necessary.  Past Medical History:  Diagnosis Date   Anxiety    Breast cancer ([place]) 2015   right- radiation   Cough    Depression    Diabetes mellitus without complication (HCC)    Dysrhythmia    Gout    HLD (hyperlipidemia)    HTN (hypertension)    Hypertension    Interstitial lung disease (HCC)    Lumbar spinal stenosis    Macular degeneration    Migraines    Osteoarthritis    Personal history of radiation therapy 2015    RIGHT lumpectomy w/ radiation 2015   Phlebitis    Pneumonia 08/2018   Pulmonary nodule    Rheumatoid arthritis ([place])       Surgical History:  Past Surgical History:  Procedure Laterality Date   ABDOMINAL HYSTERECTOMY     ANTERIOR CERVICAL DECOMP/DISCECTOMY FUSION N/A 04/04/2016   Procedure: ANTERIOR CERVICAL DECOMPRESSION/DISCECTOMY FUSION, INTERBODY PROSTHESIS,PLATE CERVICAL  FIVE-SIX,CERVICAL SIX-SEVEN,CERVICAL SEVEN-THORACIC ONE;  Surgeon: Newman Pies, MD;  Location: [facility];  Service: Neurosurgery;  Laterality: N/A;   BREAST BIOPSY Right 02/21/2015   CALCIFICATION INVOLVING HYALINIZED STROMA AND BENIGN DUCTS   BREAST BIOPSY Right 03/29/2016   FAT NECROSIS WITH CALCIFICATIONS.    BREAST BIOPSY Right 2015   + invasive mam ca   BREAST BIOPSY Right 11/30/2016   BENIGN BREAST TISSUE WITH ORGANIZING FAT NECROSIS AND CHANGES    COLONOSCOPY     ESOPHAGOGASTRODUODENOSCOPY     KYPHOPLASTY N/A 01/08/2019   Procedure: T-12, L1 KYPHOPLASTY;  Surgeon: Hessie Knows, MD;  Location: [facility];  Service: Orthopedics;  Laterality: N/A;   LYMPH NODE BIOPSY Left    MASTECTOMY, PARTIAL Right      Home Meds: Prior to Admission medications   Medication Sig Start Date End Date Taking? Authorizing Provider  allopurinol (ZYLOPRIM) 300 MG tablet Take 300 mg by mouth every morning.    Yes [provider]  amLODipine (NORVASC) 5 MG tablet Take 5 mg by mouth daily. 08/12/17 04/01/21 Yes [provider]  Ascorbic Acid (VITAMIN C) 1000 MG tablet Take  1,000 mg by mouth daily.    Yes [provider]  aspirin EC 325 MG tablet Take 325 mg by mouth daily.    Yes [provider]  Cranberry 400 MG CAPS Take 1 capsule by mouth 2 (two) times daily.   Yes [provider]  Lactobacillus (PROBIOTIC ACIDOPHILUS PO) Take 1 tablet by mouth daily.   Yes [provider]  LORazepam (ATIVAN) 0.5 MG tablet Take 0.5 mg by mouth every 6 (six) hours as needed. 11/15/20  Yes  [provider]  losartan (COZAAR) 25 MG tablet Take 25 mg by mouth daily. 11/14/20  Yes [provider]  melatonin 3 MG TABS tablet Take 6 mg by mouth at bedtime.   Yes [provider]  metFORMIN (GLUCOPHAGE) 500 MG tablet Take 500 mg by mouth 2 (two) times daily. 11/16/20  Yes [provider]  methotrexate 50 MG/2ML injection Inject 6 mg as directed once a week. 04/07/18  Yes [provider]  mirtazapine (REMERON) 7.5 MG tablet Take 7.5 mg by mouth at bedtime.   Yes [provider]  omeprazole (PRILOSEC) 20 MG capsule Take 40 mg by mouth daily. 11/14/20  Yes [provider]  predniSONE (DELTASONE) 10 MG tablet Take 10 mg by mouth daily. 03/21/21  Yes [provider]  sertraline (ZOLOFT) 50 MG tablet Take 25 mg by mouth daily.   Yes [provider]  lovastatin (MEVACOR) 40 MG tablet Take 40 mg by mouth daily. Patient not taking: Reported on 04/01/2021 02/12/21   [provider]  MAGNESIUM-OXIDE 400 (241.3 Mg) MG tablet Take 400 mg by mouth daily. Patient not taking: Reported on 04/01/2021 08/23/20   [provider]    Inpatient Medications:   allopurinol  300 mg Oral BH-q7a   vitamin C  1,000 mg Oral Daily   aspirin EC  325 mg Oral Daily   melatonin  5 mg Oral QHS   metFORMIN  500 mg Oral BID AC   mirtazapine  7.5 mg Oral QHS   pantoprazole  80 mg Oral Daily   predniSONE  10 mg Oral Daily   sertraline  25 mg Oral Daily   sodium chloride flush  3 mL Intravenous Q12H     Allergies:  Allergies  Allergen Reactions   Lisinopril Cough   Oxycodone-Acetaminophen Other (See Comments)    Caused AMS & hallucinations   Penicillins Other (See Comments)    Makes pt very "forgetful" Did it involve swelling of the face/tongue/throat, SOB, or low BP? Unknown Did it involve sudden or severe rash/hives, skin peeling, or any reaction on the inside of your mouth or nose? Unknown Did you need to seek medical  attention at a hospital or doctor's office? Unknown When did it last happen?       If all above answers are "NO", may proceed with cephalosporin use.    Clarithromycin Rash   Sulfa Antibiotics Rash    Social History   Socioeconomic History   Marital status: Widowed    Spouse name: Not on file   Number of children: Not on file   Years of education: Not on file   Highest education level: Not on file  Occupational History   Not on file  Tobacco Use   Smoking status: Never   Smokeless tobacco: Never  Vaping Use   Vaping Use: Never used  Substance and Sexual Activity   Alcohol use: No    Alcohol/week: 0.0 standard drinks   Drug use: No  Sexual activity: Not Currently  Other Topics Concern   Not on file  Social History Narrative   Not on file   Social Determinants of Health   Financial Resource Strain: Not on file  Food Insecurity: Not on file  Transportation Needs: Not on file  Physical Activity: Not on file  Stress: Not on file  Social Connections: Not on file  Intimate Partner Violence: Not on file     Family History  Problem Relation Age of Onset   Colon cancer Mother    Stomach cancer Mother    Esophageal cancer Mother    COPD Father    Colon polyps Sister    Ovarian cancer Sister    Kidney disease Neg Hx    Bladder Cancer Neg Hx    Kidney cancer Neg Hx    Breast cancer Neg Hx      Review of Systems Positive for none Negative for: General:  chills, fever, night sweats or weight changes.  Cardiovascular: PND orthopnea syncope dizziness  Dermatological skin lesions rashes Respiratory: Cough congestion Urologic: Frequent urination urination at night and hematuria Abdominal: negative for nausea, vomiting, diarrhea, bright red blood per rectum, melena, or hematemesis Neurologic: negative for visual changes, and/or hearing changes  All other systems reviewed and are otherwise negative except as noted above.  Labs: No results for input(s): CKTOTAL,  CKMB, TROPONINI in the last 72 hours. Lab Results  Component Value Date   WBC 10.7 (H) 04/01/2021   HGB 14.1 04/01/2021   HCT 41.0 04/01/2021   MCV 86.5 04/01/2021   PLT 180 04/01/2021    Recent Labs  Lab 04/01/21 1703 04/02/21 0554  NA 136 136  K 3.7 3.4*  CL 98 99  CO2 27 31  BUN 14 21  CREATININE 0.71 0.84  CALCIUM 9.0 9.2  PROT 7.3  --   BILITOT 1.0  --   ALKPHOS 56  --   ALT 21  --   AST 26  --   GLUCOSE 174* 113*   Lab Results  Component Value Date   CHOL 146 02/28/2014   HDL 43 02/28/2014   LDLCALC 47 02/28/2014   TRIG 280 (H) 02/28/2014   Lab Results  Component Value Date   DDIMER >20.00 (H) 12/02/2020    Radiology/Studies:  DG Chest Portable 1 View  Result Date: 04/01/2021 CLINICAL DATA:  Chest pain.  Cough. EXAM: PORTABLE CHEST 1 VIEW COMPARISON:  11/27/2020 FINDINGS: Heart size is normal. There are patchy infiltrates throughout the lungs bilaterally, similar in appearance to the prior study. Suspect LEFT pleural effusion. IMPRESSION: Similar bilateral pulmonary infiltrates. Electronically Signed   By: Nolon Nations M.D.   On: 04/01/2021 18:07    EKG: Normal sinus rhythm with third-degree block with ventricular escape now with telemetry showing normal sinus rhythm  Weights: Filed Weights   04/01/21 1702 04/01/21 2134  Weight: 54.4 kg 58 kg     Physical Exam: Blood pressure (!) 151/78, pulse 66, temperature 97.6 F (36.4 C), temperature source Oral, resp. rate 18, height (!) 5" (0.127 m), weight 58 kg, SpO2 99 %. Body mass index is 3,596.01 kg/m. General: Well developed, well nourished, in no acute distress. Head eyes ears nose throat: Normocephalic, atraumatic, sclera non-icteric, no xanthomas, nares are without discharge. No apparent thyromegaly and/or mass  Lungs: Normal respiratory effort.  no wheezes, no rales, no rhonchi.  Heart: RRR with normal S1 S2. no murmur gallop, no rub, PMI is normal size and placement, carotid upstroke normal  without  bruit, jugular venous pressure is normal Abdomen: Soft, non-tender, non-distended with normoactive bowel sounds. No hepatomegaly. No rebound/guarding. No obvious abdominal masses. Abdominal aorta is normal size without bruit Extremities: No edema. no cyanosis, no clubbing, no ulcers  Peripheral : 2+ bilateral upper extremity pulses, 2+ bilateral femoral pulses, 2+ bilateral dorsal pedal pulse Neuro: Alert and oriented. No facial asymmetry. No focal deficit. Moves all extremities spontaneously. Musculoskeletal: Normal muscle tone without kyphosis Psych:  Responds to questions appropriately with a normal affect.    Assessment: 87 year old female with dementia diabetes hypertension hyperlipidemia gout with weakness fatigue and other mild symptoms having incidental evidence of third-degree AV block but no evidence of significant congestive heart failure acute coronary syndrome and now is back in normal sinus rhythm  Plan: 1.  Continue medication management for hypertension control and would consider reinstatement of amlodipine alone as necessary for goal systolic blood pressure below 150 mm 2.  No further cardiac diagnostics necessary at this time due to patient more hospice status 3.  Due to patient family wishes would not do invasive procedures or additional pacemaker placement at this time.  If patient ambulating well with no further significant symptoms and issues were discharged home from cardiac standpoint  Signed, Corey Skains M.D. [facility] 04/02/2021, 7:10 AM

## 2021-04-02 NOTE — Plan of Care (Signed)
  Problem: Education: Goal: Knowledge of General Education information will improve Description: Including pain rating scale, medication(s)/side effects and non-pharmacologic comfort measures Outcome: Progressing   Problem: Clinical Measurements: Goal: Ability to maintain clinical measurements within normal limits will improve Outcome: Progressing   Problem: Safety: Goal: Ability to remain free from injury will improve Outcome: Progressing   

## 2021-04-02 NOTE — Progress Notes (Signed)
Discharge instructions explained to pt and granddaughter, Taylor/both verbalized understanding. IV and Tele removed. Will transport off unit via wheelchair when ride arrives.

## 2021-04-02 NOTE — Progress Notes (Signed)
*  PRELIMINARY RESULTS* Echocardiogram 2D Echocardiogram has been performed.  Claretta Fraise 04/02/2021, 10:57 AM

## 2021-06-01 DEATH — deceased
